# Patient Record
Sex: Male | Born: 1970 | Race: White | Hispanic: No | Marital: Married | Smoking: Current every day smoker
Health system: Southern US, Community
[De-identification: ages and names within clinical notes are randomized; demographics above are authoritative.]

## PROBLEM LIST (undated history)

## (undated) DIAGNOSIS — I2510 Atherosclerotic heart disease of native coronary artery without angina pectoris: Secondary | ICD-10-CM

## (undated) DIAGNOSIS — Z720 Tobacco use: Secondary | ICD-10-CM

## (undated) DIAGNOSIS — N529 Male erectile dysfunction, unspecified: Secondary | ICD-10-CM

## (undated) DIAGNOSIS — R519 Headache, unspecified: Secondary | ICD-10-CM

## (undated) DIAGNOSIS — I5042 Chronic combined systolic (congestive) and diastolic (congestive) heart failure: Secondary | ICD-10-CM

## (undated) DIAGNOSIS — I428 Other cardiomyopathies: Secondary | ICD-10-CM

## (undated) DIAGNOSIS — R51 Headache: Secondary | ICD-10-CM

## (undated) DIAGNOSIS — I10 Essential (primary) hypertension: Secondary | ICD-10-CM

## (undated) DIAGNOSIS — G459 Transient cerebral ischemic attack, unspecified: Secondary | ICD-10-CM

## (undated) HISTORY — DX: Other cardiomyopathies: I42.8

## (undated) HISTORY — DX: Chronic combined systolic (congestive) and diastolic (congestive) heart failure: I50.42

## (undated) HISTORY — DX: Tobacco use: Z72.0

## (undated) HISTORY — DX: Transient cerebral ischemic attack, unspecified: G45.9

## (undated) HISTORY — DX: Essential (primary) hypertension: I10

## (undated) HISTORY — DX: Male erectile dysfunction, unspecified: N52.9

---

## 2005-11-28 ENCOUNTER — Inpatient Hospital Stay (HOSPITAL_COMMUNITY): Admission: EM | Admit: 2005-11-28 | Discharge: 2005-11-29 | Payer: Self-pay | Admitting: Emergency Medicine

## 2006-07-26 ENCOUNTER — Emergency Department: Payer: Self-pay | Admitting: Emergency Medicine

## 2008-05-11 ENCOUNTER — Emergency Department: Payer: Self-pay | Admitting: Emergency Medicine

## 2008-07-03 ENCOUNTER — Other Ambulatory Visit: Payer: Self-pay

## 2008-07-03 ENCOUNTER — Observation Stay: Payer: Self-pay | Admitting: *Deleted

## 2008-07-04 ENCOUNTER — Other Ambulatory Visit: Payer: Self-pay

## 2010-10-05 ENCOUNTER — Observation Stay: Payer: Self-pay | Admitting: Internal Medicine

## 2012-06-09 ENCOUNTER — Emergency Department (HOSPITAL_COMMUNITY)
Admission: EM | Admit: 2012-06-09 | Discharge: 2012-06-09 | Disposition: A | Discharge status: Home / Self Care | Payer: Self-pay | Attending: Emergency Medicine | Admitting: Emergency Medicine

## 2012-06-09 ENCOUNTER — Encounter (HOSPITAL_COMMUNITY): Payer: Self-pay | Admitting: *Deleted

## 2012-06-09 DIAGNOSIS — E119 Type 2 diabetes mellitus without complications: Secondary | ICD-10-CM | POA: Insufficient documentation

## 2012-06-09 DIAGNOSIS — I2510 Atherosclerotic heart disease of native coronary artery without angina pectoris: Secondary | ICD-10-CM | POA: Insufficient documentation

## 2012-06-09 DIAGNOSIS — I10 Essential (primary) hypertension: Secondary | ICD-10-CM | POA: Insufficient documentation

## 2012-06-09 DIAGNOSIS — M5412 Radiculopathy, cervical region: Secondary | ICD-10-CM | POA: Insufficient documentation

## 2012-06-09 DIAGNOSIS — F17200 Nicotine dependence, unspecified, uncomplicated: Secondary | ICD-10-CM | POA: Insufficient documentation

## 2012-06-09 HISTORY — DX: Atherosclerotic heart disease of native coronary artery without angina pectoris: I25.10

## 2012-06-09 MED ORDER — HYDROCHLOROTHIAZIDE 12.5 MG PO TABS: 25.0000 mg | ORAL_TABLET | Freq: Every day | ORAL | Status: DC

## 2012-06-09 MED ORDER — KETOROLAC TROMETHAMINE 60 MG/2ML IM SOLN
60.0000 mg | Freq: Once | INTRAMUSCULAR | Status: AC
  Administered 2012-06-09: 60 mg via INTRAMUSCULAR
  Filled 2012-06-09: qty 2

## 2012-06-09 MED ORDER — KETOROLAC TROMETHAMINE 10 MG PO TABS: 10.0000 mg | ORAL_TABLET | Freq: Four times a day (QID) | ORAL | Status: AC | PRN

## 2012-06-09 MED ORDER — DEXAMETHASONE SODIUM PHOSPHATE 10 MG/ML IJ SOLN
10.0000 mg | Freq: Once | INTRAMUSCULAR | Status: AC
  Administered 2012-06-09: 10 mg via INTRAMUSCULAR
  Filled 2012-06-09: qty 1

## 2012-06-09 NOTE — ED Notes (Signed)
PT c/o shoulder pain that radiates down his arm x 1 month.  He feels he hurt his arm at work.  The pain feels like "electro-shocks".  He feels the pain is caused by the shocks in the new truck.  Pain increases with movement and coughing.

## 2012-06-09 NOTE — ED Provider Notes (Signed)
History     CSN: [id number]  Arrival date & time 06/09/12  0450   First MD Initiated Contact with Patient 06/09/12 [phone]      Chief Complaint  Patient presents with  . Neck Pain    (Consider location/radiation/quality/duration/timing/severity/associated sxs/prior treatment) HPI Comments: Pt states he has had pain in the L neck radiating into the L arm for the last month - it is worse with moving the head and neck and moving the Arm.  Sx are constant, but mild at rest.  It is an electrical shock like sensation.  No numbness or weakness and no sx in the legs.  No CP, SOB or fevers.  Denies any injury.  Patient is a 41 y.o. male presenting with neck pain. The history is provided by the patient and a relative.  Neck Pain  Pertinent negatives include no numbness and no weakness.    Past Medical History  Diagnosis Date  . Hypertension   . Diabetes mellitus   . Coronary artery disease     History reviewed. No pertinent past surgical history.  History reviewed. No pertinent family history.  History  Substance Use Topics  . Smoking status: Current Everyday Smoker -- 0.5 packs/day  . Smokeless tobacco: Not on file  . Alcohol Use: No      Review of Systems  HENT: Positive for neck pain.   Neurological: Negative for weakness and numbness.    Allergies  Review of patient's allergies indicates no known allergies.  Home Medications   Current Outpatient Rx  Name Route Sig Dispense Refill  . HYDROCHLOROTHIAZIDE 12.5 MG PO TABS Oral Take 2 tablets (25 mg total) by mouth daily. 30 tablet 1  . KETOROLAC TROMETHAMINE 10 MG PO TABS Oral Take 1 tablet (10 mg total) by mouth every 6 (six) hours as needed for pain. 20 tablet 0    BP 176/106  Pulse 88  Temp 98.1 F (36.7 C) (Oral)  Resp 20  SpO2 96%  Physical Exam  Nursing note and vitals reviewed. Constitutional: He appears well-developed and well-nourished. No distress.  HENT:  Head: Normocephalic and atraumatic.    Mouth/Throat: Oropharynx is clear and moist. No oropharyngeal exudate.  Eyes: Conjunctivae and EOM are normal. Pupils are equal, round, and reactive to light. Right eye exhibits no discharge. Left eye exhibits no discharge. No scleral icterus.  Neck: Normal range of motion. Neck supple. No JVD present. No thyromegaly present.  Cardiovascular: Normal rate, regular rhythm, normal heart sounds and intact distal pulses.  Exam reveals no gallop and no friction rub.   No murmur heard. Pulmonary/Chest: Effort normal and breath sounds normal. No respiratory distress. He has no wheezes. He has no rales.  Abdominal: Soft. Bowel sounds are normal. He exhibits no distension and no mass. There is no tenderness.  Musculoskeletal: Normal range of motion. He exhibits no edema and no tenderness.  Lymphadenopathy:    He has no cervical adenopathy.  Neurological: He is alert. Coordination normal.       Reproducible pain with axial load.  No numbness or weakness of the LUE.  Normal gait.  Skin: Skin is warm and dry. No rash noted. No erythema.  Psychiatric: He has a normal mood and affect. His behavior is normal.    ED Course  Procedures (including critical care time)  Labs Reviewed - No data to display No results found.   1. Cervical radiculopathy   2. Hypertension       MDM  Symptoms are consistent with  a cervical radiculopathy. The patient has requested Toradol as he felt this has helped as an outpatient. ER he has a prescription for hydrocodone which she will use as needed.  No other neurologic symptoms at this time and the patient has received intramuscular Toradol and intramuscular ketorolac in the emergency department.  Will refer to neurosurgery as outpatient.  The patient readily admits that he has been out of his blood pressure medication. I will refill his hydrochlorothiazide. He is aware of this problem and will have his blood pressure rechecked in one week.  Discharge Prescriptions  include:  toradol HCTZ        Vida Roller, MD 06/09/12 [phone]

## 2012-06-09 NOTE — ED Notes (Signed)
MD at bedside. 

## 2012-06-24 ENCOUNTER — Encounter (HOSPITAL_COMMUNITY): Payer: Self-pay | Admitting: Emergency Medicine

## 2012-06-24 ENCOUNTER — Emergency Department (INDEPENDENT_AMBULATORY_CARE_PROVIDER_SITE_OTHER): Payer: BC Managed Care – PPO

## 2012-06-24 ENCOUNTER — Encounter (HOSPITAL_COMMUNITY): Payer: Self-pay | Admitting: *Deleted

## 2012-06-24 ENCOUNTER — Emergency Department (HOSPITAL_COMMUNITY)
Admission: EM | Admit: 2012-06-24 | Discharge: 2012-06-24 | Discharge status: Against Medical Advice | Payer: BC Managed Care – PPO | Attending: Emergency Medicine | Admitting: Emergency Medicine

## 2012-06-24 ENCOUNTER — Emergency Department (HOSPITAL_COMMUNITY)
Admission: EM | Admit: 2012-06-24 | Discharge: 2012-06-24 | Disposition: A | Discharge status: Nursing Home (Includes ICF) | Payer: BC Managed Care – PPO | Source: Home / Self Care | Attending: Family Medicine | Admitting: Family Medicine

## 2012-06-24 DIAGNOSIS — R739 Hyperglycemia, unspecified: Secondary | ICD-10-CM

## 2012-06-24 DIAGNOSIS — I10 Essential (primary) hypertension: Secondary | ICD-10-CM

## 2012-06-24 DIAGNOSIS — R7309 Other abnormal glucose: Secondary | ICD-10-CM

## 2012-06-24 DIAGNOSIS — Z0389 Encounter for observation for other suspected diseases and conditions ruled out: Secondary | ICD-10-CM | POA: Insufficient documentation

## 2012-06-24 DIAGNOSIS — I169 Hypertensive crisis, unspecified: Secondary | ICD-10-CM

## 2012-06-24 LAB — URINE MICROSCOPIC-ADD ON

## 2012-06-24 LAB — BASIC METABOLIC PANEL
CO2: 25 mEq/L (ref 19–32)
Calcium: 9.6 mg/dL (ref 8.4–10.5)
Chloride: 97 mEq/L (ref 96–112)
Creatinine, Ser: 0.61 mg/dL (ref 0.50–1.35)
GFR calc non Af Amer: >90 mL/min (ref >90)
Glucose, Bld: 410 mg/dL — ABNORMAL HIGH (ref 70–99)
Potassium: 3.7 mEq/L (ref 3.5–5.1)
Sodium: 135 mEq/L (ref 135–145)

## 2012-06-24 LAB — URINALYSIS, ROUTINE W REFLEX MICROSCOPIC
Bilirubin Urine: NEGATIVE
Glucose, UA: >1000 mg/dL — AB
Hgb urine dipstick: NEGATIVE
Protein, ur: NEGATIVE mg/dL
pH: 5.5 (ref 5.0–8.0)

## 2012-06-24 LAB — CBC WITH DIFFERENTIAL/PLATELET
Basophils Relative: 0 % (ref 0–1)
Eosinophils Absolute: 0.2 10*3/uL (ref 0.0–0.7)
HCT: 47.2 % (ref 39.0–52.0)
Lymphocytes Relative: 26 % (ref 12–46)
Lymphs Abs: 2.2 10*3/uL (ref 0.7–4.0)
MCH: 29.9 pg (ref 26.0–34.0)
MCHC: 36.9 g/dL — ABNORMAL HIGH (ref 30.0–36.0)
MCV: 81.2 fL (ref 78.0–100.0)
Monocytes Relative: 5 % (ref 3–12)
Platelets: 171 10*3/uL (ref 150–400)
RDW: 12.8 % (ref 11.5–15.5)
WBC: 8.4 10*3/uL (ref 4.0–10.5)

## 2012-06-24 LAB — POCT I-STAT, CHEM 8
Chloride: 99 mEq/L (ref 96–112)
Creatinine, Ser: 0.6 mg/dL (ref 0.50–1.35)
HCT: 50 % (ref 39.0–52.0)
Sodium: 135 mEq/L (ref 135–145)

## 2012-06-24 MED ORDER — HYDROCODONE-ACETAMINOPHEN 5-325 MG PO TABS
ORAL_TABLET | ORAL | Status: AC
  Filled 2012-06-24: qty 2

## 2012-06-24 MED ORDER — HYDROCODONE-ACETAMINOPHEN 5-325 MG PO TABS
2.0000 | ORAL_TABLET | Freq: Once | ORAL | Status: AC
  Administered 2012-06-24: 2 via ORAL

## 2012-06-24 NOTE — ED Notes (Signed)
Patient has decided to leave without being seen. Encouraged patient to stay and informed him that he is welcomed to come back at anytime. Pt aaox4, resp e/u, nad noted at this time. Moex4. Ambulatory with no assist. Escorted by significant other.

## 2012-06-24 NOTE — ED Notes (Signed)
Pt sent here from [facility] for htn; pt was seen there for chronic neck pain and given a vicodin; pt sts took bp meds this am

## 2012-06-24 NOTE — ED Notes (Signed)
Pt  States  He  Has  A  Bulging  Disc   In his  Neck   He  States  He  Has  Been  Having     Pain  For  sebveral  Months  -  He  States  He  Was  Seen in er  sev  Weeks  Ago    -  He  States  He  Is  Trying  To see  Dr  Bettina Gavia      - he  Says he  Has  HTN  And DM      hE  SAYS  HE  DID  TAKE  HIS  BP  MED  THIS  AM

## 2012-06-24 NOTE — ED Provider Notes (Signed)
History     CSN: [id number]  Arrival date & time 06/24/12  1400   First MD Initiated Contact with Patient 06/24/12 1405      Chief Complaint  Patient presents with  . Neck Pain    (Consider location/radiation/quality/duration/timing/severity/associated sxs/prior treatment) HPI Comments: 41 year old smoker male with a history of HTN, diabetes and coronary artery disease and chronic neck pain. Here complaining of neck pain with radiation to the left arm for 2 months. He was recently seen at the emergency department with similar complaints, was found hypertensive and had a refill of HCTZ, was also diagnosed with cervical radiculopathy and had a prescription for naproxen and a referral to neuro surgery. The patient here with similar symptoms stated he's taking hydrochlorothiazide 25 mg daily last time this morning at 5 AM. Pain in the left side of the neck and left arm described as a burning type and constant with intermittent electric-like shocks down his left arm, not improving with previously prescribed medication. Denies nausea or chest pain. No shortness of breath. No headache or dizziness. No visual changes.   Past Medical History  Diagnosis Date  . Hypertension   . Diabetes mellitus   . Coronary artery disease     History reviewed. No pertinent past surgical history.  No family history on file.  History  Substance Use Topics  . Smoking status: Current Everyday Smoker -- 0.5 packs/day  . Smokeless tobacco: Not on file  . Alcohol Use: No      Review of Systems  HENT: Positive for neck pain.   Eyes: Negative for visual disturbance.  Respiratory: Negative for chest tightness and shortness of breath.   Cardiovascular: Negative for chest pain and leg swelling.  Gastrointestinal: Negative for nausea, vomiting and abdominal pain.  Musculoskeletal:       Left shoulder and arm pain as per history of present illness  Skin: Negative for rash.  Neurological: Negative for  dizziness, numbness and headaches.    Allergies  Review of patient's allergies indicates no known allergies.  Home Medications   Current Outpatient Rx  Name Route Sig Dispense Refill  . ARTHRITIS STRENGTH BC POWDER PO Oral Take 2 Packages by mouth 2 (two) times daily as needed. For pain    . HYDROCHLOROTHIAZIDE 12.5 MG PO TABS Oral Take 2 tablets (25 mg total) by mouth daily. 30 tablet 1  . METFORMIN HCL 1000 MG PO TABS Oral Take 1,000 mg by mouth daily with breakfast.      BP 202/140  Pulse 82  Temp 98.2 F (36.8 C) (Oral)  Resp 18  SpO2 96%  Physical Exam  Nursing note and vitals reviewed. Constitutional: He is oriented to person, place, and time. He appears well-developed and well-nourished. No distress.       Uncomfortable with shoulder pain.  HENT:  Head: Normocephalic and atraumatic.  Right Ear: External ear normal.  Left Ear: External ear normal.  Mouth/Throat: No oropharyngeal exudate.  Eyes: Conjunctivae and EOM are normal. Pupils are equal, round, and reactive to light. No scleral icterus.  Neck: No JVD present. No thyromegaly present.       Neck: Fair flexion and extension. Reported that electric shooting type pain radiated to left arm with left shoulder elevation and positive Spurling sign.  Cardiovascular: Normal rate, regular rhythm and normal heart sounds.   Pulmonary/Chest: Effort normal and breath sounds normal. No respiratory distress. He has no wheezes. He has no rales. He exhibits no tenderness.  Lymphadenopathy:    He  has no cervical adenopathy.  Neurological: He is alert and oriented to person, place, and time.       Left arm: Reported Hyperesthesia in inner medial proximal arm and forearm. Decrease 2 point discrimination in different points in left forearm and proximal left arm. Reported anesthesia at the base of the left thumb. Decreased strength in left grip compared with the right side.  Skin: No rash noted. He is not diaphoretic.    ED Course    Procedures (including critical care time)  Labs Reviewed  POCT I-STAT, CHEM 8 - Abnormal; Notable for the following:    Glucose, Bld 413 (*)     All other components within normal limits  LAB REPORT - SCANNED   Dg Cervical Spine Complete  06/24/2012  *RADIOLOGY REPORT*  Clinical Data: Neck pain.  Bulging disc.  CERVICAL SPINE - COMPLETE 4+ VIEW  Comparison: None.  Findings: Straightening of the normal cervical lordosis. Prevertebral soft tissues are normal.  No cervical spine fracture or dislocation.  Intervertebral disc spaces appear within normal limits.  No foraminal stenosis identified.  Odontoid appears normal.  Cervicothoracic junction normal.  IMPRESSION: No acute abnormality.  Straightening of the normal cervical lordosis may be positional or secondary to spasm.  Original Report Authenticated By: Andreas Newport, M.D.     1. Hypertensive crisis   2. Hyperglycemia       MDM  41 year old smoker male with history of diabetes hypertension and chronic neck pain. Here complaining of exacerbation of chronic neck pain with radiation to left arm associated with numbness and weakness (not new). Patient also found to be hypertensive with a blood pressure in the 200's and diastolic 110s-140. Denies chest pain, headache or blurry vision, no shortness of breath or leg swelling. Reports taking his blood pressure medication (hydrochlorothiazide 25 mg earlier today). Patient was treated with hydrocodone 10 mg and acetaminophen 650 mg with improvement of his pain. Blood pressure remained significantly elevated on recheck 206/140. Hyperglycemic with a glucose above 400. EKG with normal sinus rhythm and flattening or inverted T wave in D3 and AVF leads and early repolarization changes. Decided to transfer to the emergency department for further evaluation management.   Sharin Grave, MD 06/25/12 1137

## 2012-06-25 NOTE — ED Notes (Signed)
Pt called and states he left the ED yesterday after we transferred him down to them for hypertension.  He waited til 2000 and then left to pick up his daughter.  He states he wants something for his neck because that is why he came here, not for his b/p.  Explained to him that we will not call in medication and he would have to be seen and if his b/p is elevated then that would again be our priority.  THH

## 2012-07-06 ENCOUNTER — Other Ambulatory Visit: Payer: Self-pay | Admitting: Family Medicine

## 2012-07-06 DIAGNOSIS — M5412 Radiculopathy, cervical region: Secondary | ICD-10-CM

## 2012-07-09 ENCOUNTER — Inpatient Hospital Stay: Admission: RE | Admit: 2012-07-09 | Payer: BC Managed Care – PPO | Source: Ambulatory Visit

## 2012-07-16 ENCOUNTER — Ambulatory Visit
Admission: RE | Admit: 2012-07-16 | Discharge: 2012-07-16 | Disposition: A | Discharge status: Home / Self Care | Payer: BC Managed Care – PPO | Source: Ambulatory Visit | Attending: Family Medicine | Admitting: Family Medicine

## 2012-07-16 DIAGNOSIS — M5412 Radiculopathy, cervical region: Secondary | ICD-10-CM

## 2013-01-04 ENCOUNTER — Encounter (HOSPITAL_COMMUNITY): Payer: Self-pay

## 2013-01-04 ENCOUNTER — Emergency Department (HOSPITAL_COMMUNITY): Payer: Self-pay

## 2013-01-04 ENCOUNTER — Emergency Department (HOSPITAL_COMMUNITY)
Admission: EM | Admit: 2013-01-04 | Discharge: 2013-01-04 | Disposition: A | Discharge status: Home / Self Care | Payer: Self-pay | Attending: Emergency Medicine | Admitting: Emergency Medicine

## 2013-01-04 DIAGNOSIS — H7090 Unspecified mastoiditis, unspecified ear: Secondary | ICD-10-CM | POA: Insufficient documentation

## 2013-01-04 DIAGNOSIS — F17200 Nicotine dependence, unspecified, uncomplicated: Secondary | ICD-10-CM | POA: Insufficient documentation

## 2013-01-04 DIAGNOSIS — Z79899 Other long term (current) drug therapy: Secondary | ICD-10-CM | POA: Insufficient documentation

## 2013-01-04 DIAGNOSIS — R51 Headache: Secondary | ICD-10-CM

## 2013-01-04 DIAGNOSIS — I10 Essential (primary) hypertension: Secondary | ICD-10-CM | POA: Insufficient documentation

## 2013-01-04 DIAGNOSIS — Z91199 Patient's noncompliance with other medical treatment and regimen due to unspecified reason: Secondary | ICD-10-CM | POA: Insufficient documentation

## 2013-01-04 DIAGNOSIS — E119 Type 2 diabetes mellitus without complications: Secondary | ICD-10-CM | POA: Insufficient documentation

## 2013-01-04 DIAGNOSIS — I2510 Atherosclerotic heart disease of native coronary artery without angina pectoris: Secondary | ICD-10-CM | POA: Insufficient documentation

## 2013-01-04 MED ORDER — DIPHENHYDRAMINE HCL 50 MG/ML IJ SOLN
25.0000 mg | Freq: Once | INTRAMUSCULAR | Status: AC
  Administered 2013-01-04: 25 mg via INTRAVENOUS
  Filled 2013-01-04: qty 1

## 2013-01-04 MED ORDER — LEVOFLOXACIN 500 MG PO TABS: 500.0000 mg | ORAL_TABLET | Freq: Every day | ORAL | Status: DC

## 2013-01-04 MED ORDER — METOCLOPRAMIDE HCL 5 MG/ML IJ SOLN
10.0000 mg | Freq: Once | INTRAMUSCULAR | Status: AC
  Administered 2013-01-04: 10 mg via INTRAVENOUS
  Filled 2013-01-04: qty 2

## 2013-01-04 MED ORDER — CLONIDINE HCL 0.2 MG PO TABS
0.2000 mg | ORAL_TABLET | Freq: Once | ORAL | Status: AC
  Administered 2013-01-04: 0.2 mg via ORAL
  Filled 2013-01-04: qty 1

## 2013-01-04 MED ORDER — HYDROCHLOROTHIAZIDE 25 MG PO TABS: 25.0000 mg | ORAL_TABLET | Freq: Every day | ORAL | Status: DC

## 2013-01-04 MED ORDER — MORPHINE SULFATE 4 MG/ML IJ SOLN
4.0000 mg | Freq: Once | INTRAMUSCULAR | Status: AC
  Administered 2013-01-04: 4 mg via INTRAVENOUS
  Filled 2013-01-04: qty 1

## 2013-01-04 NOTE — ED Notes (Signed)
Pt. Reports ear problem x4 months with associated left sided HA/migraine. States it has gotten worse this past week. Denies dizziness, numbness/tingling in extremities, weakness. Alert and oriented x4.

## 2013-01-04 NOTE — ED Provider Notes (Addendum)
History     CSN: [id number]  Arrival date & time 01/04/13  0459   First MD Initiated Contact with Patient 01/04/13 [phone]      Chief Complaint  Patient presents with  . Headache    (Consider location/radiation/quality/duration/timing/severity/associated sxs/prior treatment) Patient is a 42 y.o. male presenting with headaches. The history is provided by the patient.  Headache Pain location:  L parietal Radiates to:  Does not radiate Severity currently:  8/10 Severity at highest:  8/10 Onset quality:  Gradual Duration:  8 weeks Timing:  Constant Progression:  Unchanged Chronicity:  Chronic Similar to prior headaches: yes   Relieved by:  Nothing Worsened by:  Nothing tried Ineffective treatments:  None tried   Past Medical History  Diagnosis Date  . Hypertension   . Diabetes mellitus   . Coronary artery disease     History reviewed. No pertinent past surgical history.  No family history on file.  History  Substance Use Topics  . Smoking status: Current Every Day Smoker -- 0.50 packs/day  . Smokeless tobacco: Not on file  . Alcohol Use: No      Review of Systems  Neurological: Positive for headaches.  All other systems reviewed and are negative.    Allergies  Review of patient's allergies indicates no known allergies.  Home Medications   Current Outpatient Rx  Name  Route  Sig  Dispense  Refill  . Aspirin-Salicylamide-Caffeine (ARTHRITIS STRENGTH BC POWDER PO)   Oral   Take 2 Packages by mouth 2 (two) times daily as needed. For pain         . hydrochlorothiazide (HYDRODIURIL) 12.5 MG tablet   Oral   Take 2 tablets (25 mg total) by mouth daily.   30 tablet   1   . metFORMIN (GLUCOPHAGE) 1000 MG tablet   Oral   Take 1,000 mg by mouth daily with breakfast.           BP 204/114  Pulse 94  Temp(Src) 98.5 F (36.9 C) (Oral)  Resp 19  SpO2 95%  Physical Exam  Nursing note and vitals reviewed. Constitutional: He is oriented to person,  place, and time. He appears well-developed and well-nourished.  HENT:  Head: Normocephalic and atraumatic.  Eyes: Conjunctivae are normal. Pupils are equal, round, and reactive to light.  Neck: Normal range of motion. Neck supple.  Cardiovascular: Normal rate, regular rhythm, normal heart sounds and intact distal pulses.   Pulmonary/Chest: Effort normal and breath sounds normal.  Abdominal: Soft. Bowel sounds are normal.  Neurological: He is alert and oriented to person, place, and time.  Skin: Skin is warm and dry.  Psychiatric: He has a normal mood and affect. His behavior is normal. Judgment and thought content normal.    ED Course  Procedures (including critical care time)  Labs Reviewed - No data to display No results found.   No diagnosis found.    MDM  + htn, ha,  Noncompliant with htn meds.  Will ct, analgesia, antihypertensive, reassess   Improved.  + mastoidiits.  Will abx, ent fu, refill antihypertensive,  Ret new/worsening sxs     Jireh Elmore Lytle Michaels, MD 01/04/13 0522  Rosanne Ashing, MD 01/04/13 [phone]

## 2013-02-02 ENCOUNTER — Ambulatory Visit (INDEPENDENT_AMBULATORY_CARE_PROVIDER_SITE_OTHER): Payer: Self-pay | Admitting: Otolaryngology

## 2013-02-02 DIAGNOSIS — H6980 Other specified disorders of Eustachian tube, unspecified ear: Secondary | ICD-10-CM

## 2013-02-02 DIAGNOSIS — J343 Hypertrophy of nasal turbinates: Secondary | ICD-10-CM

## 2013-02-28 ENCOUNTER — Other Ambulatory Visit: Payer: Self-pay | Admitting: Family Medicine

## 2013-08-14 ENCOUNTER — Other Ambulatory Visit: Payer: Self-pay | Admitting: Family Medicine

## 2013-12-23 ENCOUNTER — Other Ambulatory Visit: Payer: Self-pay | Admitting: Family Medicine

## 2014-02-13 ENCOUNTER — Ambulatory Visit (INDEPENDENT_AMBULATORY_CARE_PROVIDER_SITE_OTHER): Payer: Managed Care, Other (non HMO) | Admitting: Family Medicine

## 2014-02-13 ENCOUNTER — Encounter: Payer: Self-pay | Admitting: Family Medicine

## 2014-02-13 VITALS — BP 150/100 | HR 100 | Temp 98.1°F | Resp 18 | Ht 70.0 in | Wt 212.0 lb

## 2014-02-13 DIAGNOSIS — R634 Abnormal weight loss: Secondary | ICD-10-CM

## 2014-02-13 DIAGNOSIS — E1165 Type 2 diabetes mellitus with hyperglycemia: Principal | ICD-10-CM

## 2014-02-13 DIAGNOSIS — IMO0001 Reserved for inherently not codable concepts without codable children: Secondary | ICD-10-CM

## 2014-02-13 NOTE — Progress Notes (Signed)
Subjective:    Patient ID: Keith [name], male    DOB: [birth year], 43 y.o.   MRN: [id number]  HPI Patient presents today with uncontrolled weight loss over the last several months. The last time I saw the patient was in 2013. At that time he had a hemoglobin A1c of 12. The patient was lost to followup due to his lack of insurance. He is not taking any medication regularly since then. He occasionally takes metformin 1000 mg by mouth daily and he occasionally takes hydrochlorothiazide. He never checks his blood sugar. He does report polyuria and polydipsia. He denies any blurred vision. He reports satiety and nausea. 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 He denies any fevers chills easy bruising abdominal pain, chest pain, melena, hematochezia. Past Medical History  Diagnosis Date  . Hypertension   . Diabetes mellitus   . Coronary artery disease    Current Outpatient Prescriptions on File Prior to Visit  Medication Sig Dispense Refill  . hydrochlorothiazide (HYDRODIURIL) 25 MG tablet TAKE 1 TABLET BY MOUTH EVERY DAY  30 tablet  5  . metFORMIN (GLUCOPHAGE) 1000 MG tablet Take 1,000 mg by mouth 2 (two) times daily with a meal.       . glipiZIDE (GLUCOTROL) 5 MG tablet Take 5 mg by mouth 2 (two) times daily before a meal.       No current facility-administered medications on file prior to visit.   No Known Allergies History   Social History  . Marital Status: Married    Spouse Name: N/A    Number of Children: N/A  . Years of Education: N/A   Occupational History  . Not on file.   Social History Main Topics  . Smoking status: Current Every Day Smoker -- 0.50 packs/day  . Smokeless tobacco: Not on file  . Alcohol Use: No  . Drug Use: No  . Sexual Activity:    Other Topics Concern  . Not on file   Social History Narrative  . No narrative on file      Review of Systems  All other systems reviewed and are negative.       Objective:   Physical Exam  Vitals  reviewed. Constitutional: He appears well-developed and well-nourished.  Neck: Neck supple. No thyromegaly present.  Cardiovascular: Normal rate, regular rhythm, normal heart sounds and intact distal pulses.  Exam reveals no gallop and no friction rub.   No murmur heard. Pulmonary/Chest: Effort normal and breath sounds normal. No respiratory distress. He has no wheezes. He has no rales. He exhibits no tenderness.  Abdominal: Soft. Bowel sounds are normal. He exhibits no distension and no mass. There is no tenderness. There is no rebound and no guarding.  Lymphadenopathy:    He has no cervical adenopathy.          Assessment & Plan:  1. Type II or unspecified type diabetes mellitus without mention of complication, uncontrolled The patient's loss of  weight is likely due to uncontrolled diabetes. The patient does not want to check blood work due to the class and the fact he has no insurance. The started a CMP and hemoglobin A1c. I await these test results. Based upon these test results we will implement medications to try to bring his sugars under control. He is asked 18 pounds since his last office visit he was 230 pounds 2 years ago.  Patient's blood pressure is out of control. I will await the results of the test results prior  to starting medication. I will check the patient's renal function and determine the medications on going to be using her diabetes prior to starting him on blood pressure medication. - COMPLETE METABOLIC PANEL WITH GFR - Hemoglobin A1c  2. Loss of weight

## 2014-02-14 ENCOUNTER — Other Ambulatory Visit: Payer: Self-pay

## 2014-02-14 LAB — COMPLETE METABOLIC PANEL WITH GFR
ALT: 17 U/L (ref 0–53)
AST: 17 U/L (ref 0–37)
Albumin: 4.2 g/dL (ref 3.5–5.2)
Alkaline Phosphatase: 71 U/L (ref 39–117)
BUN: 12 mg/dL (ref 6–23)
CALCIUM: 9.2 mg/dL (ref 8.4–10.5)
CHLORIDE: 99 meq/L (ref 96–112)
CO2: 29 mEq/L (ref 19–32)
CREATININE: 0.68 mg/dL (ref 0.50–1.35)
GFR, Est African American: >89 mL/min
GFR, Est Non African American: >89 mL/min
Glucose, Bld: 178 mg/dL — ABNORMAL HIGH (ref 70–99)
Potassium: 3.4 mEq/L — ABNORMAL LOW (ref 3.5–5.3)
Sodium: 137 mEq/L (ref 135–145)
Total Bilirubin: 1 mg/dL (ref 0.2–1.2)
Total Protein: 6.6 g/dL (ref 6.0–8.3)

## 2014-02-14 LAB — HEMOGLOBIN A1C
HEMOGLOBIN A1C: 9.6 % — AB (ref <5.7)
MEAN PLASMA GLUCOSE: 229 mg/dL — AB (ref <117)

## 2014-02-14 MED ORDER — GLIPIZIDE 10 MG PO TABS
10.0000 mg | ORAL_TABLET | Freq: Two times a day (BID) | ORAL | Status: DC
Start: 2014-02-14 — End: 2014-09-18

## 2014-02-14 MED ORDER — PIOGLITAZONE HCL 15 MG PO TABS: 15.0000 mg | ORAL_TABLET | Freq: Every day | ORAL | Status: DC

## 2014-05-20 ENCOUNTER — Other Ambulatory Visit: Payer: Self-pay | Admitting: Family Medicine

## 2014-09-17 ENCOUNTER — Ambulatory Visit (INDEPENDENT_AMBULATORY_CARE_PROVIDER_SITE_OTHER): Payer: Managed Care, Other (non HMO) | Admitting: Family Medicine

## 2014-09-17 ENCOUNTER — Ambulatory Visit
Admission: RE | Admit: 2014-09-17 | Discharge: 2014-09-17 | Disposition: A | Discharge status: Home / Self Care | Payer: Commercial Indemnity | Source: Ambulatory Visit | Attending: Family Medicine | Admitting: Family Medicine

## 2014-09-17 ENCOUNTER — Encounter: Payer: Self-pay | Admitting: Family Medicine

## 2014-09-17 VITALS — BP 130/78 | HR 78 | Temp 98.3°F | Resp 18 | Wt 194.0 lb

## 2014-09-17 DIAGNOSIS — E1165 Type 2 diabetes mellitus with hyperglycemia: Secondary | ICD-10-CM

## 2014-09-17 DIAGNOSIS — R634 Abnormal weight loss: Secondary | ICD-10-CM

## 2014-09-17 DIAGNOSIS — IMO0002 Reserved for concepts with insufficient information to code with codable children: Secondary | ICD-10-CM

## 2014-09-17 LAB — CBC WITH DIFFERENTIAL/PLATELET
BASOS ABS: 0 10*3/uL (ref 0.0–0.1)
Basophils Relative: 0 % (ref 0–1)
Eosinophils Absolute: 0.1 10*3/uL (ref 0.0–0.7)
Eosinophils Relative: 2 % (ref 0–5)
HEMATOCRIT: 49.5 % (ref 39.0–52.0)
Hemoglobin: 17.6 g/dL — ABNORMAL HIGH (ref 13.0–17.0)
LYMPHS PCT: 22 % (ref 12–46)
Lymphs Abs: 1.4 10*3/uL (ref 0.7–4.0)
MCH: 29.2 pg (ref 26.0–34.0)
MCHC: 35.6 g/dL (ref 30.0–36.0)
MCV: 82.1 fL (ref 78.0–100.0)
MONO ABS: 0.4 10*3/uL (ref 0.1–1.0)
Monocytes Relative: 6 % (ref 3–12)
NEUTROS ABS: 4.4 10*3/uL (ref 1.7–7.7)
NEUTROS PCT: 70 % (ref 43–77)
PLATELETS: 162 10*3/uL (ref 150–400)
RBC: 6.03 MIL/uL — ABNORMAL HIGH (ref 4.22–5.81)
RDW: 14.2 % (ref 11.5–15.5)
WBC: 6.3 10*3/uL (ref 4.0–10.5)

## 2014-09-17 LAB — COMPLETE METABOLIC PANEL WITH GFR
ALT: 8 U/L (ref 0–53)
AST: 11 U/L (ref 0–37)
Albumin: 4.2 g/dL (ref 3.5–5.2)
Alkaline Phosphatase: 66 U/L (ref 39–117)
BILIRUBIN TOTAL: 1 mg/dL (ref 0.2–1.2)
BUN: 10 mg/dL (ref 6–23)
CO2: 29 mEq/L (ref 19–32)
CREATININE: 0.73 mg/dL (ref 0.50–1.35)
Calcium: 8.8 mg/dL (ref 8.4–10.5)
Chloride: 98 mEq/L (ref 96–112)
GFR, Est Non African American: >89 mL/min
Glucose, Bld: 144 mg/dL — ABNORMAL HIGH (ref 70–99)
Potassium: 3.4 mEq/L — ABNORMAL LOW (ref 3.5–5.3)
Sodium: 138 mEq/L (ref 135–145)
Total Protein: 6.2 g/dL (ref 6.0–8.3)

## 2014-09-17 LAB — TSH: TSH: 4.965 u[IU]/mL — AB (ref 0.350–4.500)

## 2014-09-17 NOTE — Progress Notes (Signed)
Subjective:    Patient ID: Keith [name], male    DOB: January 25, 1971, 43 y.o.   MRN: [id number]  HPI Patient stopped all his medication after I saw him in March due to erectile dysfunction. The only medication that he is currently taking his hydrochlorothiazide 25 mg by mouth daily. When I saw the patient in March his hemoglobin A1c was almost 10. He had been losing substantial weight prior to that. He continues to lose substantial weight and has lost an additional 14 pounds over the last 8 months. He reports poor appetite. He reports polyuria. He reports blurred vision. He reports daily dizziness. He reports strong smelling concentrated urine. He denies any fevers or chills or easy bruising. He denies any melena or hematochezia. He denies any cough or chest pain. He does have a history of smoking. He denies any risk of HIV exposure. Past Medical History  Diagnosis Date  . Hypertension   . Diabetes mellitus   . Coronary artery disease    No past surgical history on file. Current Outpatient Prescriptions on File Prior to Visit  Medication Sig Dispense Refill  . hydrochlorothiazide (HYDRODIURIL) 25 MG tablet TAKE 1 TABLET BY MOUTH EVERY DAY 30 tablet 5  . hydrochlorothiazide (HYDRODIURIL) 25 MG tablet TAKE 1 TABLET BY MOUTH EVERY DAY 30 tablet 11  . metFORMIN (GLUCOPHAGE) 1000 MG tablet Take 1,000 mg by mouth 2 (two) times daily with a meal.     . glipiZIDE (GLUCOTROL) 10 MG tablet Take 1 tablet (10 mg total) by mouth 2 (two) times daily before a meal. 60 tablet 3  . pioglitazone (ACTOS) 15 MG tablet Take 1 tablet (15 mg total) by mouth daily. 30 tablet 3   No current facility-administered medications on file prior to visit.   No Known Allergies History   Social History  . Marital Status: Married    Spouse Name: N/A    Number of Children: N/A  . Years of Education: N/A   Occupational History  . Not on file.   Social History Main Topics  . Smoking status: Current Every Day Smoker  -- 0.50 packs/day  . Smokeless tobacco: Not on file  . Alcohol Use: No  . Drug Use: No  . Sexual Activity:    Other Topics Concern  . Not on file   Social History Narrative      Review of Systems  All other systems reviewed and are negative.      Objective:   Physical Exam  Constitutional: He appears well-developed and well-nourished. No distress.  HENT:  Right Ear: External ear normal.  Left Ear: External ear normal.  Nose: Nose normal.  Mouth/Throat: Oropharynx is clear and moist.  Eyes: Conjunctivae are normal. No scleral icterus.  Neck: Neck supple. No thyromegaly present.  Cardiovascular: Normal rate, regular rhythm and normal heart sounds.   Pulmonary/Chest: Effort normal and breath sounds normal. No respiratory distress. He has no wheezes. He has no rales.  Abdominal: Soft. Bowel sounds are normal. He exhibits no distension. There is no tenderness. There is no rebound and no guarding.  Lymphadenopathy:    He has no cervical adenopathy.  Skin: No rash noted. He is not diaphoretic.  Vitals reviewed.         Assessment & Plan:  Loss of weight - Plan: COMPLETE METABOLIC PANEL WITH GFR, Hemoglobin A1c, CBC with Differential, TSH, Sedimentation rate, DG Chest 2 View  Diabetes mellitus type II, uncontrolled - Plan: COMPLETE METABOLIC PANEL WITH GFR, Hemoglobin A1c,  CBC with Differential, TSH, Sedimentation rate, DG Chest 2 View  I suspect the patient's weight loss is due to uncontrolled diabetes mellitus. Check hemoglobin A1c is up a at all. I'll see him back tomorrow to discuss his options for control of his diabetes. To be also check a TSH and sedimentation rate and CBC. I'll also send the patient to get a chest x-ray given his smoking history. Recheck in 24 hours.

## 2014-09-18 ENCOUNTER — Ambulatory Visit (INDEPENDENT_AMBULATORY_CARE_PROVIDER_SITE_OTHER): Payer: Managed Care, Other (non HMO) | Admitting: Family Medicine

## 2014-09-18 ENCOUNTER — Encounter: Payer: Self-pay | Admitting: Family Medicine

## 2014-09-18 VITALS — BP 142/100 | HR 98 | Temp 98.6°F | Resp 16 | Wt 195.0 lb

## 2014-09-18 DIAGNOSIS — R634 Abnormal weight loss: Secondary | ICD-10-CM

## 2014-09-18 LAB — HEMOGLOBIN A1C
HEMOGLOBIN A1C: 6.8 % — AB (ref <5.7)
MEAN PLASMA GLUCOSE: 148 mg/dL — AB (ref <117)

## 2014-09-18 LAB — SEDIMENTATION RATE: SED RATE: 1 mm/h (ref 0–16)

## 2014-09-18 MED ORDER — METFORMIN HCL 1000 MG PO TABS: 1000.0000 mg | ORAL_TABLET | Freq: Two times a day (BID) | ORAL | Status: DC

## 2014-09-18 MED ORDER — VENLAFAXINE HCL ER 75 MG PO CP24: 75.0000 mg | ORAL_CAPSULE | Freq: Every day | ORAL | Status: DC

## 2014-09-18 MED ORDER — HYDROCHLOROTHIAZIDE 25 MG PO TABS: ORAL_TABLET | ORAL | Status: DC

## 2014-09-18 NOTE — Progress Notes (Signed)
Subjective:    Patient ID: Keith [name], male    DOB: September 22, 1971, 43 y.o.   MRN: [id number]  HPI 09/17/14 Patient stopped all his medication after I saw him in March due to erectile dysfunction. The only medication that he is currently taking his hydrochlorothiazide 25 mg by mouth daily. When I saw the patient in March his hemoglobin A1c was almost 10. He had been losing substantial weight prior to that. He continues to lose substantial weight and has lost an additional 14 pounds over the last 8 months. He reports poor appetite. He reports polyuria. He reports blurred vision. He reports daily dizziness. He reports strong smelling concentrated urine. He denies any fevers or chills or easy bruising. He denies any melena or hematochezia. He denies any cough or chest pain. He does have a history of smoking. He denies any risk of HIV exposure.  At that time, my plan was:  I suspect the patient's weight loss is due to uncontrolled diabetes mellitus. Check hemoglobin A1c to determine how poorly controlled his diabetes is. I'll see him back tomorrow to discuss his options for control of his diabetes. To be thorough, I will also check a TSH and sedimentation rate and CBC. I'll also send the patient to get a chest x-ray given his smoking history. Recheck in 24 hours.  09/18/14 Patient's lab work as listed below: Office Visit on 09/17/2014  Component Date Value Ref Range Status  . Sodium 09/17/2014 138  135 - 145 mEq/L Final  . Potassium 09/17/2014 3.4* 3.5 - 5.3 mEq/L Final  . Chloride 09/17/2014 98  96 - 112 mEq/L Final  . CO2 09/17/2014 29  19 - 32 mEq/L Final  . Glucose, Bld 09/17/2014 144* 70 - 99 mg/dL Final  . BUN 09/17/2014 10  6 - 23 mg/dL Final  . Creat 09/17/2014 0.73  0.50 - 1.35 mg/dL Final  . Total Bilirubin 09/17/2014 1.0  0.2 - 1.2 mg/dL Final  . Alkaline Phosphatase 09/17/2014 66  39 - 117 U/L Final  . AST 09/17/2014 11  0 - 37 U/L Final  . ALT 09/17/2014 8  0 - 53 U/L Final  .  Total Protein 09/17/2014 6.2  6.0 - 8.3 g/dL Final  . Albumin 09/17/2014 4.2  3.5 - 5.2 g/dL Final  . Calcium 09/17/2014 8.8  8.4 - 10.5 mg/dL Final  . GFR, Est African American 09/17/2014 >89   Final  . GFR, Est Non African American 09/17/2014 >89   Final   Comment:   The estimated GFR is a calculation valid for adults (>=38 years old) that uses the CKD-EPI algorithm to adjust for age and sex. It is   not to be used for children, pregnant women, hospitalized patients,    patients on dialysis, or with rapidly changing kidney function. According to the NKDEP, eGFR >89 is normal, 60-89 shows mild impairment, 30-59 shows moderate impairment, 15-29 shows severe impairment and <15 is ESRD.     [name] Kitchen Hgb A1c MFr Bld 09/17/2014 6.8* <5.7 % Final   Comment:                                                                        According to the [place]  Recommendations for 2011, when HbA1c is used as a screening test:     >=6.5%   Diagnostic of Diabetes Mellitus            (if abnormal result is confirmed)   5.7-6.4%   Increased risk of developing Diabetes Mellitus   References:Diagnosis and Classification of Diabetes Mellitus,Diabetes QQPY,1950,93(OIZTI 1):S62-S69 and Standards of Medical Care in         Diabetes - 2011,Diabetes WPYK,9983,38 (Suppl 1):S11-S61.     . Mean Plasma Glucose 09/17/2014 148* <117 mg/dL Final  . WBC 09/17/2014 6.3  4.0 - 10.5 K/uL Final  . RBC 09/17/2014 6.03* 4.22 - 5.81 MIL/uL Final  . Hemoglobin 09/17/2014 17.6* 13.0 - 17.0 g/dL Final  . HCT 09/17/2014 49.5  39.0 - 52.0 % Final  . MCV 09/17/2014 82.1  78.0 - 100.0 fL Final  . MCH 09/17/2014 29.2  26.0 - 34.0 pg Final  . MCHC 09/17/2014 35.6  30.0 - 36.0 g/dL Final  . RDW 09/17/2014 14.2  11.5 - 15.5 % Final  . Platelets 09/17/2014 162  150 - 400 K/uL Final  . Neutrophils Relative % 09/17/2014 70  43 - 77 % Final  . Neutro Abs 09/17/2014 4.4  1.7 - 7.7 K/uL Final  . Lymphocytes Relative  09/17/2014 22  12 - 46 % Final  . Lymphs Abs 09/17/2014 1.4  0.7 - 4.0 K/uL Final  . Monocytes Relative 09/17/2014 6  3 - 12 % Final  . Monocytes Absolute 09/17/2014 0.4  0.1 - 1.0 K/uL Final  . Eosinophils Relative 09/17/2014 2  0 - 5 % Final  . Eosinophils Absolute 09/17/2014 0.1  0.0 - 0.7 K/uL Final  . Basophils Relative 09/17/2014 0  0 - 1 % Final  . Basophils Absolute 09/17/2014 0.0  0.0 - 0.1 K/uL Final  . Smear Review 09/17/2014 Criteria for review not met   Final  . TSH 09/17/2014 4.965* 0.350 - 4.500 uIU/mL Final  . Sed Rate 09/17/2014 1  0 - 16 mm/hr Final   His chest x-ray shows no acute cardiopulmonary process.  He is here today to discuss further.  We discussed all his lab work. The patient reports he is under tremendous stress. He is also having a difficult time sleeping. He reports anhedonia. He reports no desire to eat. He does not want to take medication which can cause erectile dysfunction. That is why he discontinued the glipizide and Actos. Past Medical History  Diagnosis Date  . Hypertension   . Diabetes mellitus   . Coronary artery disease    No past surgical history on file. Current outpatient prescriptions: hydrochlorothiazide (HYDRODIURIL) 25 MG tablet, TAKE 1 TABLET BY MOUTH EVERY DAY, Disp: 30 tablet, Rfl: 11;  metFORMIN (GLUCOPHAGE) 1000 MG tablet, Take 1 tablet (1,000 mg total) by mouth 2 (two) times daily with a meal., Disp: 60 tablet, Rfl: 5;  venlafaxine XR (EFFEXOR XR) 75 MG 24 hr capsule, Take 1 capsule (75 mg total) by mouth daily with breakfast., Disp: 30 capsule, Rfl: 1  No Known Allergies History   Social History  . Marital Status: Married    Spouse Name: N/A    Number of Children: N/A  . Years of Education: N/A   Occupational History  . Not on file.   Social History Main Topics  . Smoking status: Current Every Day Smoker -- 0.50 packs/day  . Smokeless tobacco: Not on file  . Alcohol Use: No  . Drug Use: No  . Sexual Activity: Not on  file     Other Topics Concern  . Not on file   Social History Narrative      Review of Systems  All other systems reviewed and are negative.      Objective:   Physical Exam  Constitutional: He appears well-developed and well-nourished. No distress.  HENT:  Right Ear: External ear normal.  Left Ear: External ear normal.  Nose: Nose normal.  Mouth/Throat: Oropharynx is clear and moist.  Eyes: Conjunctivae are normal. No scleral icterus.  Neck: Neck supple. No thyromegaly present.  Cardiovascular: Normal rate, regular rhythm and normal heart sounds.   Pulmonary/Chest: Effort normal and breath sounds normal. No respiratory distress. He has no wheezes. He has no rales.  Abdominal: Soft. Bowel sounds are normal. He exhibits no distension. There is no tenderness. There is no rebound and no guarding.  Lymphadenopathy:    He has no cervical adenopathy.  Skin: No rash noted. He is not diaphoretic.  Vitals reviewed.         Assessment & Plan:  Loss of weight - Plan: venlafaxine XR (EFFEXOR XR) 75 MG 24 hr capsule  Patient's hemoglobin A1c is acceptable. The remainder of his lab work is within normal limits. I do not believe his borderline elevated TSH is causing weight loss. His chest x-ray was normal. Other than his stress, the patient is asymptomatic. Therefore I believe it could be stress and depression which is causing his loss of weight due to anorexia. Therefore I'll start the patient on Effexor XR 75 mg by mouth daily and recheck the patient in 1 month. Patient attributes his elevated blood pressure to anxiety. I will recheck that in 1 month as well

## 2014-10-18 ENCOUNTER — Ambulatory Visit: Payer: Managed Care, Other (non HMO) | Admitting: Family Medicine

## 2014-10-22 ENCOUNTER — Ambulatory Visit: Payer: Managed Care, Other (non HMO) | Admitting: Family Medicine

## 2015-01-23 ENCOUNTER — Emergency Department (HOSPITAL_COMMUNITY): Payer: Commercial Indemnity

## 2015-01-23 ENCOUNTER — Encounter (HOSPITAL_COMMUNITY): Payer: Self-pay | Admitting: *Deleted

## 2015-01-23 ENCOUNTER — Observation Stay (HOSPITAL_COMMUNITY): Payer: Commercial Indemnity

## 2015-01-23 ENCOUNTER — Observation Stay (HOSPITAL_COMMUNITY)
Admission: EM | Admit: 2015-01-23 | Discharge: 2015-01-24 | Disposition: A | Discharge status: Home / Self Care | Payer: Commercial Indemnity | Attending: Internal Medicine | Admitting: Internal Medicine

## 2015-01-23 DIAGNOSIS — R4781 Slurred speech: Secondary | ICD-10-CM | POA: Diagnosis not present

## 2015-01-23 DIAGNOSIS — E119 Type 2 diabetes mellitus without complications: Secondary | ICD-10-CM | POA: Insufficient documentation

## 2015-01-23 DIAGNOSIS — I2510 Atherosclerotic heart disease of native coronary artery without angina pectoris: Secondary | ICD-10-CM | POA: Insufficient documentation

## 2015-01-23 DIAGNOSIS — I10 Essential (primary) hypertension: Secondary | ICD-10-CM | POA: Diagnosis not present

## 2015-01-23 DIAGNOSIS — I639 Cerebral infarction, unspecified: Secondary | ICD-10-CM

## 2015-01-23 DIAGNOSIS — E1159 Type 2 diabetes mellitus with other circulatory complications: Secondary | ICD-10-CM | POA: Diagnosis present

## 2015-01-23 DIAGNOSIS — R51 Headache: Secondary | ICD-10-CM

## 2015-01-23 DIAGNOSIS — E118 Type 2 diabetes mellitus with unspecified complications: Secondary | ICD-10-CM

## 2015-01-23 DIAGNOSIS — R479 Unspecified speech disturbances: Secondary | ICD-10-CM

## 2015-01-23 DIAGNOSIS — G459 Transient cerebral ischemic attack, unspecified: Secondary | ICD-10-CM | POA: Diagnosis present

## 2015-01-23 DIAGNOSIS — R202 Paresthesia of skin: Secondary | ICD-10-CM | POA: Diagnosis not present

## 2015-01-23 DIAGNOSIS — G43501 Persistent migraine aura without cerebral infarction, not intractable, with status migrainosus: Secondary | ICD-10-CM | POA: Insufficient documentation

## 2015-01-23 DIAGNOSIS — R519 Headache, unspecified: Secondary | ICD-10-CM

## 2015-01-23 DIAGNOSIS — I152 Hypertension secondary to endocrine disorders: Secondary | ICD-10-CM | POA: Diagnosis present

## 2015-01-23 DIAGNOSIS — I160 Hypertensive urgency: Secondary | ICD-10-CM

## 2015-01-23 DIAGNOSIS — E1140 Type 2 diabetes mellitus with diabetic neuropathy, unspecified: Secondary | ICD-10-CM

## 2015-01-23 HISTORY — DX: Headache: R51

## 2015-01-23 HISTORY — DX: Headache, unspecified: R51.9

## 2015-01-23 LAB — I-STAT CHEM 8, ED
BUN: 17 mg/dL (ref 6–23)
Calcium, Ion: 1.07 mmol/L — ABNORMAL LOW (ref 1.12–1.23)
Chloride: 101 mmol/L (ref 96–112)
Creatinine, Ser: 0.7 mg/dL (ref 0.50–1.35)
Glucose, Bld: 160 mg/dL — ABNORMAL HIGH (ref 70–99)
HEMATOCRIT: 49 % (ref 39.0–52.0)
HEMOGLOBIN: 16.7 g/dL (ref 13.0–17.0)
Potassium: 3.5 mmol/L (ref 3.5–5.1)
Sodium: 139 mmol/L (ref 135–145)
TCO2: 23 mmol/L (ref 0–100)

## 2015-01-23 LAB — DIFFERENTIAL
Basophils Absolute: 0 10*3/uL (ref 0.0–0.1)
Basophils Relative: 0 % (ref 0–1)
EOS ABS: 0.1 10*3/uL (ref 0.0–0.7)
Eosinophils Relative: 1 % (ref 0–5)
LYMPHS PCT: 23 % (ref 12–46)
Lymphs Abs: 1.6 10*3/uL (ref 0.7–4.0)
MONOS PCT: 4 % (ref 3–12)
Monocytes Absolute: 0.3 10*3/uL (ref 0.1–1.0)
NEUTROS ABS: 4.9 10*3/uL (ref 1.7–7.7)
Neutrophils Relative %: 72 % (ref 43–77)

## 2015-01-23 LAB — COMPREHENSIVE METABOLIC PANEL
ALBUMIN: 3.8 g/dL (ref 3.5–5.2)
ALK PHOS: 67 U/L (ref 39–117)
ALT: 14 U/L (ref 0–53)
AST: 19 U/L (ref 0–37)
Anion gap: 7 (ref 5–15)
BUN: 15 mg/dL (ref 6–23)
CO2: 27 mmol/L (ref 19–32)
Calcium: 9 mg/dL (ref 8.4–10.5)
Chloride: 104 mmol/L (ref 96–112)
Creatinine, Ser: 0.75 mg/dL (ref 0.50–1.35)
GFR calc Af Amer: >90 mL/min (ref >90)
GFR calc non Af Amer: >90 mL/min (ref >90)
Glucose, Bld: 160 mg/dL — ABNORMAL HIGH (ref 70–99)
Potassium: 3.5 mmol/L (ref 3.5–5.1)
Sodium: 138 mmol/L (ref 135–145)
Total Bilirubin: 1.2 mg/dL (ref 0.3–1.2)
Total Protein: 6.8 g/dL (ref 6.0–8.3)

## 2015-01-23 LAB — CBC
HCT: 49.3 % (ref 39.0–52.0)
Hemoglobin: 17.7 g/dL — ABNORMAL HIGH (ref 13.0–17.0)
MCH: 29.4 pg (ref 26.0–34.0)
MCHC: 35.9 g/dL (ref 30.0–36.0)
MCV: 81.8 fL (ref 78.0–100.0)
PLATELETS: 172 10*3/uL (ref 150–400)
RBC: 6.03 MIL/uL — ABNORMAL HIGH (ref 4.22–5.81)
RDW: 12.5 % (ref 11.5–15.5)
WBC: 7 10*3/uL (ref 4.0–10.5)

## 2015-01-23 LAB — CBG MONITORING, ED: GLUCOSE-CAPILLARY: 157 mg/dL — AB (ref 70–99)

## 2015-01-23 LAB — GLUCOSE, CAPILLARY: Glucose-Capillary: 132 mg/dL — ABNORMAL HIGH (ref 70–99)

## 2015-01-23 LAB — I-STAT TROPONIN, ED: TROPONIN I, POC: 0 ng/mL (ref 0.00–0.08)

## 2015-01-23 LAB — PROTIME-INR
INR: 0.99 (ref 0.00–1.49)
PROTHROMBIN TIME: 13.2 s (ref 11.6–15.2)

## 2015-01-23 LAB — ETHANOL: Alcohol, Ethyl (B): <5 mg/dL (ref 0–9)

## 2015-01-23 LAB — APTT: aPTT: 34 seconds (ref 24–37)

## 2015-01-23 LAB — TROPONIN I: TROPONIN I: 0.07 ng/mL — AB (ref <0.031)

## 2015-01-23 MED ORDER — SODIUM CHLORIDE 0.9 % IV BOLUS (SEPSIS)
500.0000 mL | Freq: Once | INTRAVENOUS | Status: AC
  Administered 2015-01-23: 500 mL via INTRAVENOUS

## 2015-01-23 MED ORDER — SODIUM CHLORIDE 0.9 % IV SOLN
INTRAVENOUS | Status: AC
  Administered 2015-01-23: 500 mL via INTRAVENOUS

## 2015-01-23 MED ORDER — LABETALOL HCL 5 MG/ML IV SOLN
10.0000 mg | INTRAVENOUS | Status: DC | PRN
  Administered 2015-01-23: 10 mg via INTRAVENOUS
  Filled 2015-01-23: qty 4

## 2015-01-23 MED ORDER — LORAZEPAM 2 MG/ML IJ SOLN
1.0000 mg | Freq: Once | INTRAMUSCULAR | Status: AC
  Administered 2015-01-23: 1 mg via INTRAVENOUS

## 2015-01-23 MED ORDER — SODIUM CHLORIDE 0.9 % IV SOLN
100.0000 mL/h | INTRAVENOUS | Status: DC
  Administered 2015-01-23: 100 mL/h via INTRAVENOUS

## 2015-01-23 MED ORDER — STROKE: EARLY STAGES OF RECOVERY BOOK
Freq: Once | Status: AC
  Administered 2015-01-23: 1
  Filled 2015-01-23: qty 1

## 2015-01-23 MED ORDER — INSULIN ASPART 100 UNIT/ML ~~LOC~~ SOLN: 0.0000 [IU] | Freq: Every day | SUBCUTANEOUS | Status: DC

## 2015-01-23 MED ORDER — LORAZEPAM 2 MG/ML IJ SOLN
INTRAMUSCULAR | Status: AC
  Filled 2015-01-23: qty 1

## 2015-01-23 MED ORDER — INSULIN ASPART 100 UNIT/ML ~~LOC~~ SOLN
0.0000 [IU] | Freq: Three times a day (TID) | SUBCUTANEOUS | Status: DC
  Administered 2015-01-24: 3 [IU] via SUBCUTANEOUS

## 2015-01-23 MED ORDER — HYDROCHLOROTHIAZIDE 25 MG PO TABS
25.0000 mg | ORAL_TABLET | Freq: Every day | ORAL | Status: DC
  Administered 2015-01-23 – 2015-01-24 (×2): 25 mg via ORAL
  Filled 2015-01-23 (×2): qty 1

## 2015-01-23 MED ORDER — ENOXAPARIN SODIUM 40 MG/0.4ML ~~LOC~~ SOLN
40.0000 mg | SUBCUTANEOUS | Status: DC
  Administered 2015-01-23: 40 mg via SUBCUTANEOUS
  Filled 2015-01-23: qty 0.4

## 2015-01-23 NOTE — Consult Note (Signed)
Referring Physician: Jeraldine Loots    Chief Complaint: HA associated with dysarthria and left arm tingling  HPI:                                                                                                                                         Keith [name] is an 44 y.o. male who woke up this AM with a sever HA.  He went to work and sensed something was not right --he thought his BP was elevated. While he was at work he noted he started to have tingling in his left arm and his tongue felt weird.  EMS was called to scene due to patient having HA and diaphoretic along with coworkers noting his speech was slurred. On arrival it was noted his BP was 215/120.  On arrival to ED his BP was 198/112 and hid symptoms had resolved. CT head was negative for bleed or acute stroke.  In further discussion he noted he has migraines/HA every other day and takes about 50 BC's in a week. He has had neurological symptoms with both migraine HA and when his BP is elevated.  Denies vertigo, double vision, focal weakness, or visual disturbances.  Due to symptoms resolved tPA was not administered.   Date last known well: Date: 01/23/2015 Time last known well: Time: 13:30 tPA Given: No: symptoms resolved Modified Rankin: Rankin Score=0    Past Medical History  Diagnosis Date  . Hypertension   . Diabetes mellitus   . Coronary artery disease   . Frequent headaches     History reviewed. No pertinent past surgical history.  Family History  Problem Relation Age of Onset  . Hypertension Mother   . Hypertension Father    Social History:  reports that he has been smoking.  He does not have any smokeless tobacco history on file. He reports that he does not drink alcohol or use illicit drugs.  Allergies: No Known Allergies  Medications:                                                                                                                           Current Facility-Administered Medications  Medication  Dose Route Frequency Provider Last Rate Last Dose  . sodium chloride 0.9 % bolus 500 mL  500 mL Intravenous Once Gerhard Munch, MD       Followed by  .  0.9 %  sodium chloride infusion  100 mL/hr Intravenous Continuous Gerhard Munch, MD       Current Outpatient Prescriptions  Medication Sig Dispense Refill  . hydrochlorothiazide (HYDRODIURIL) 25 MG tablet TAKE 1 TABLET BY MOUTH EVERY DAY 30 tablet 11  . metFORMIN (GLUCOPHAGE) 1000 MG tablet Take 1 tablet (1,000 mg total) by mouth 2 (two) times daily with a meal. 60 tablet 5  . venlafaxine XR (EFFEXOR XR) 75 MG 24 hr capsule Take 1 capsule (75 mg total) by mouth daily with breakfast. 30 capsule 1     ROS:                                                                                                                                       History obtained from the patient  General ROS: negative for - chills, fatigue, fever, night sweats, weight gain or weight loss Psychological ROS: negative for - behavioral disorder, hallucinations, memory difficulties, mood swings or suicidal ideation Ophthalmic ROS: negative for - blurry vision, double vision, eye pain or loss of vision ENT ROS: negative for - epistaxis, nasal discharge, oral lesions, sore throat, tinnitus or vertigo Allergy and Immunology ROS: negative for - hives or itchy/watery eyes Hematological and Lymphatic ROS: negative for - bleeding problems, bruising or swollen lymph nodes Endocrine ROS: negative for - galactorrhea, hair pattern changes, polydipsia/polyuria or temperature intolerance Respiratory ROS: negative for - cough, hemoptysis, shortness of breath or wheezing Cardiovascular ROS: negative for - chest pain, dyspnea on exertion, edema or irregular heartbeat Gastrointestinal ROS: negative for - abdominal pain, diarrhea, hematemesis, nausea/vomiting or stool incontinence Genito-Urinary ROS: negative for - dysuria, hematuria, incontinence or urinary  frequency/urgency Musculoskeletal ROS: negative for - joint swelling or muscular weakness Neurological ROS: as noted in HPI Dermatological ROS: negative for rash and skin lesion changes  Neurologic Examination:                                                                                                      Blood pressure 195/112, pulse 86, temperature 98.4 F (36.9 C), temperature source Oral, resp. rate 18, SpO2 98 %.  HEENT-  Normocephalic, no lesions, without obvious abnormality.  Normal external eye and conjunctiva.  Normal TM's bilaterally.  Normal auditory canals and external ears. Normal external nose, mucus membranes and septum.  Normal pharynx. Cardiovascular- S1, S2 normal, pulses palpable throughout   Lungs- chest clear, no wheezing, rales, normal symmetric air entry Abdomen- normal findings: bowel sounds normal Extremities- no  edema Lymph-no adenopathy palpable Musculoskeletal-no joint tenderness, deformity or swelling Skin-warm and dry, no hyperpigmentation, vitiligo, or suspicious lesions  Neurological Examination Mental Status: Alert, oriented, thought content appropriate.  Speech fluent without evidence of aphasia.  Able to follow 3 step commands without difficulty. Cranial Nerves: II: Discs flat bilaterally; Visual fields grossly normal, pupils equal, round, reactive to light and accommodation III,IV, VI: ptosis not present, extra-ocular motions intact bilaterally V,VII: smile symmetric, facial light touch sensation normal bilaterally VIII: hearing normal bilaterally IX,X: uvula rises symmetrically XI: bilateral shoulder shrug XII: midline tongue extension Motor: Right : Upper extremity   5/5    Left:     Upper extremity   5/5  Lower extremity   5/5     Lower extremity   5/5 Tone and bulk:normal tone throughout; no atrophy noted Sensory: Pinprick and light touch intact throughout, bilaterally Deep Tendon Reflexes: 2+ and symmetric throughout Plantars: Right:  downgoing   Left: downgoing Cerebellar: normal finger-to-nose, normal rapid alternating movements and normal heel-to-shin test Gait: not assessed due to multiple leads.        Lab Results: Basic Metabolic Panel:  Recent Labs Lab 01/23/15 1541  NA 139  K 3.5  CL 101  GLUCOSE 160*  BUN 17  CREATININE 0.70    Liver Function Tests: No results for input(s): AST, ALT, ALKPHOS, BILITOT, PROT, ALBUMIN in the last 168 hours. No results for input(s): LIPASE, AMYLASE in the last 168 hours. No results for input(s): AMMONIA in the last 168 hours.  CBC:  Recent Labs Lab 01/23/15 1530 01/23/15 1541  WBC 7.0  --   NEUTROABS 4.9  --   HGB 17.7* 16.7  HCT 49.3 49.0  MCV 81.8  --   PLT 172  --     Cardiac Enzymes: No results for input(s): CKTOTAL, CKMB, CKMBINDEX, TROPONINI in the last 168 hours.  Lipid Panel: No results for input(s): CHOL, TRIG, HDL, CHOLHDL, VLDL, LDLCALC in the last 168 hours.  CBG:  Recent Labs Lab 01/23/15 1552  GLUCAP 157*    Microbiology: No results found for this or any previous visit.  Coagulation Studies: No results for input(s): LABPROT, INR in the last 72 hours.  Imaging: Ct Head Wo Contrast  01/23/2015   CLINICAL DATA:  Headaches and slurred speech  EXAM: CT HEAD WITHOUT CONTRAST  TECHNIQUE: Contiguous axial images were obtained from the base of the skull through the vertex without intravenous contrast.  COMPARISON:  01/04/2013  FINDINGS: The bony calvarium is intact. No gross soft tissue abnormality is noted. No findings to suggest acute hemorrhage, acute infarction or space-occupying mass lesion are noted.  IMPRESSION: No acute intracranial abnormality is noted.  These results were called by telephone at the time of interpretation on 01/23/2015 at 3:50 pm to Dr. Rubin Payor, who verbally acknowledged these results.   Electronically Signed   By: Alcide Clever M.D.   On: 01/23/2015 15:51       Assessment and plan discussed with with attending  physician and they are in agreement.    Felicie Morn PA-C [facility] [phone]  01/23/2015, 4:03 PM   Assessment: 44 y.o. male with frequent HA presenting to ED with elevated BP/HA and transient slurred speech and left arm tingling. On arrival symptoms had resolved and exam non-focal. tPA was not administered due to resolved symptoms. Sx likely secondary complicated migraine but cannot rule out CVA with risk factors and elevated BP.   Stroke Risk Factors - diabetes mellitus and hypertension  1) MRI  brain w/o contrast--if negative no further stroke work up 2) BP control 3) treat HA symptomatically 4) will need out patient neurology follow up at time of discharge for frequent HA and HA Px  Patient seen and examined together with physician assistant and I concur with the assessment and plan.  Wyatt Portela, MD

## 2015-01-23 NOTE — ED Notes (Addendum)
Pt c/o slurred speech and "thick tongue" at 1330. Reports one episode of L arm tingling while at work this afternoon that last about an hour and resolved. Pt also c/o headache since waking up this morning; hx of migraines which is normally relieved with BC powders. Has taken BC powder x 6 without relief. Initial BP for EMS 214/148. Reports similar episodes in the past when "my blood pressure spikes."

## 2015-01-23 NOTE — H&P (Signed)
[facility] History and Physical  ANTONEO LAVELLE FPO:[id number] DOB: Jan 25, 1971 DOA: 01/23/2015   PCP: Leo Grosser, MD    Chief Complaint: slurred speech  HPI: Keith [name] is a 44 y.o. male with PMH of HTN, DM, CAD frequent headaches presents with slurred speech starting about 1:30 today. He's had a headache all day and had intermittent tingling of the left arm this after noon. All symptoms resolved at this point. Code stroke was called but cancelled. Neuro has seen the patient. BP quite high as well. They are requesting hospitalists to admit the patient.    General: The patient denies anorexia, fever, weight loss Cardiac: Denies chest pain, syncope, palpitations, pedal edema  Respiratory: Denies cough, shortness of breath, wheezing GI: Denies severe indigestion/heartburn, abdominal pain, nausea, vomiting, diarrhea and constipation GU: Denies hematuria, incontinence, dysuria  Musculoskeletal: Denies arthritis  Skin: Denies suspicious skin lesions Neurologic: Denies focal weakness or numbness, change in vision Psychiatry: Denies depression or anxiety. Hematologic: no easy bruising or bleeding   Past Medical History  Diagnosis Date  . Hypertension   . Diabetes mellitus   . Coronary artery disease   . Frequent headaches     History reviewed. No pertinent past surgical history.  Social History: smokes 1ppd, drinks alcohol rarely Lives at home with wife.     No Known Allergies  Family History  Problem Relation Age of Onset  . Hypertension Mother   . Hypertension Father       Prior to Admission medications   Medication Sig Start Date End Date Taking? Authorizing Provider  hydrochlorothiazide (HYDRODIURIL) 25 MG tablet TAKE 1 TABLET BY MOUTH EVERY DAY Patient taking differently: Take 25 mg by mouth daily.  09/18/14  Yes Donita Brooks, MD  metFORMIN (GLUCOPHAGE) 1000 MG tablet Take 1 tablet (1,000 mg total) by mouth 2 (two) times daily with a meal.  09/18/14  Yes Donita Brooks, MD  venlafaxine XR (EFFEXOR XR) 75 MG 24 hr capsule Take 1 capsule (75 mg total) by mouth daily with breakfast. Patient not taking: Reported on 01/23/2015 09/18/14   Donita Brooks, MD     Physical Exam: Filed Vitals:   01/23/15 1601 01/23/15 1615 01/23/15 1645 01/23/15 1700  BP:  164/100 160/98 157/94  Pulse:  78 69 72  Temp: 98.4 F (36.9 C)     TempSrc:      Resp:  19 18 17   SpO2:  98% 98% 97%     General: AAO x3  HEENT: Normocephalic and Atraumatic, Mucous membranes pink                PERRLA; EOM intact; No scleral icterus,                 Nares: Patent, Oropharynx: Clear, Fair Dentition                 Neck: FROM, no cervical lymphadenopathy, thyromegaly, carotid bruit or JVD;  Breasts: deferred CHEST WALL: No tenderness  CHEST: Normal respiration, clear to auscultation bilaterally  HEART: Regular rate and rhythm- 2/6 murmur at apex with no radiation BACK: No kyphosis or scoliosis; no CVA tenderness  GI: Positive Bowel Sounds, soft, non-tender; no masses, no organomegaly Rectal Exam: deferred MSK: No cyanosis, clubbing, or edema Genitalia: not examined  SKIN:  maculo-papular rash on upper back- non pruritic, non- blanching  CNS: Alert and Oriented x 4, Nonfocal exam, CN 2-12 intact  Labs on Admission:  Basic Metabolic Panel:  Recent Labs Lab 01/23/15  1530 01/23/15 1541  NA 138 139  K 3.5 3.5  CL 104 101  CO2 27  --   GLUCOSE 160* 160*  BUN 15 17  CREATININE 0.75 0.70  CALCIUM 9.0  --    Liver Function Tests:  Recent Labs Lab 01/23/15 1530  AST 19  ALT 14  ALKPHOS 67  BILITOT 1.2  PROT 6.8  ALBUMIN 3.8   No results for input(s): LIPASE, AMYLASE in the last 168 hours. No results for input(s): AMMONIA in the last 168 hours. CBC:  Recent Labs Lab 01/23/15 1530 01/23/15 1541  WBC 7.0  --   NEUTROABS 4.9  --   HGB 17.7* 16.7  HCT 49.3 49.0  MCV 81.8  --   PLT 172  --    Cardiac Enzymes:  Recent Labs Lab  01/23/15 1530  TROPONINI 0.07*    BNP (last 3 results) No results for input(s): BNP in the last 8760 hours.  ProBNP (last 3 results) No results for input(s): PROBNP in the last 8760 hours.  CBG:  Recent Labs Lab 01/23/15 1552  GLUCAP 157*    Radiological Exams on Admission: Ct Head Wo Contrast  01/23/2015   CLINICAL DATA:  Headaches and slurred speech  EXAM: CT HEAD WITHOUT CONTRAST  TECHNIQUE: Contiguous axial images were obtained from the base of the skull through the vertex without intravenous contrast.  COMPARISON:  01/04/2013  FINDINGS: The bony calvarium is intact. No gross soft tissue abnormality is noted. No findings to suggest acute hemorrhage, acute infarction or space-occupying mass lesion are noted.  IMPRESSION: No acute intracranial abnormality is noted.  These results were called by telephone at the time of interpretation on 01/23/2015 at 3:50 pm to Dr. Rubin Payor, who verbally acknowledged these results.   Electronically Signed   By: Alcide Clever M.D.   On: 01/23/2015 15:51    EKG: Independently reviewed. Sinus rhythm at 84 bpm  Assessment/Plan Principal Problem:   Slurred speech/  Headache - symptoms resolved - neuro suspecting complicated migraine but would like to r/o CVA- MRI pending  Active Problems:   Hypertensive urgency - already received one dose of labetalol in ER -would give PRN labetalol only if MRI negative for CVA or if systolic > 200    DM (diabetes mellitus) with complications - hold Metformin- place on sliding scale insulin  Murmur - f/u ECHO   Consulted:   Code Status: full code Family Communication: withwife DVT Prophylaxis:Lovenox  Time spent:35 min  Jawanna Dykman, MD [facility]  If 7PM-7AM, please contact night-coverage [email or web address] 01/23/2015, 5:37 PM

## 2015-01-23 NOTE — ED Notes (Signed)
Pt c/o slurred speech and "swollen tongue" starting at 1330 today. Had one episode of L arm tingling while at working last an hour. Reports feeling like "hitting my funny bone." Pt reports waking up this morning with a migraine. Hx of the same that is usually relieved with 3 BC powders; reports taking 6 bc powders without relief. Reports frequent use of BC powder (up to 50 per week) for headaches. Pt noted to be hypertensive with hx of the same. Reports swelling in the tongue and headache when bp increases. At this time, pt reports tongue and lips feel better. Pt appears clear with some slurring of words. No other neuro deficits

## 2015-01-23 NOTE — ED Provider Notes (Signed)
CSN: [id number]     Arrival date & time 01/23/15  1529 History   First MD Initiated Contact with Patient 01/23/15 1534     Chief Complaint  Patient presents with  . Code Stroke    HPI  Patient presents as a code stroke. Patient developed headache any hours prior to admission, but approximately 1.5 hours ago he developed speech difficulty, with slurring. He also describes tingling in his left arm, but no pain or weakness in either upper extremity. No confusion, disorientation, no syncope. No other changes from baseline medical condition. precipitant. Patient has history of prior headache, without prior stroke.   Patient was evaluated on arrival and again after CT.   Past Medical History  Diagnosis Date  . Hypertension   . Diabetes mellitus   . Coronary artery disease   . Frequent headaches    History reviewed. No pertinent past surgical history. Family History  Problem Relation Age of Onset  . Hypertension Mother   . Hypertension Father    History  Substance Use Topics  . Smoking status: Current Every Day Smoker -- 0.50 packs/day  . Smokeless tobacco: Not on file  . Alcohol Use: No    Review of Systems  Constitutional:       Per HPI, otherwise negative  HENT:       Per HPI, otherwise negative  Respiratory:       Per HPI, otherwise negative  Cardiovascular:       Per HPI, otherwise negative  Gastrointestinal: Negative for vomiting.  Endocrine:       Negative aside from HPI  Genitourinary:       Neg aside from HPI   Musculoskeletal:       Per HPI, otherwise negative  Skin: Negative.   Neurological: Positive for speech difficulty and headaches. Negative for dizziness, syncope and facial asymmetry.      Allergies  Review of patient's allergies indicates no known allergies.  Home Medications   Prior to Admission medications   Medication Sig Start Date End Date Taking? Authorizing Provider  hydrochlorothiazide (HYDRODIURIL) 25 MG tablet TAKE 1 TABLET BY  MOUTH EVERY DAY Patient taking differently: Take 25 mg by mouth daily.  09/18/14  Yes Donita Brooks, MD  metFORMIN (GLUCOPHAGE) 1000 MG tablet Take 1 tablet (1,000 mg total) by mouth 2 (two) times daily with a meal. 09/18/14  Yes Donita Brooks, MD  venlafaxine XR (EFFEXOR XR) 75 MG 24 hr capsule Take 1 capsule (75 mg total) by mouth daily with breakfast. Patient not taking: Reported on 01/23/2015 09/18/14   Donita Brooks, MD   BP 157/94 mmHg  Pulse 72  Temp(Src) 98.4 F (36.9 C) (Oral)  Resp 17  SpO2 97% Physical Exam  Constitutional: He is oriented to person, place, and time. He appears well-developed. No distress.  HENT:  Head: Normocephalic and atraumatic.  Eyes: Conjunctivae and EOM are normal.  Cardiovascular: Normal rate and regular rhythm.   Pulmonary/Chest: Effort normal. No stridor. No respiratory distress.  Abdominal: He exhibits no distension.  Musculoskeletal: He exhibits no edema.  Neurological: He is alert and oriented to person, place, and time. He displays no atrophy and no tremor. A cranial nerve deficit is present. He exhibits normal muscle tone. He displays no seizure activity. Coordination normal.  Speech is garbled- patient differentiates this from tru word finding difficulty.  Skin: Skin is warm and dry.  Psychiatric: He has a normal mood and affect.  Nursing note and vitals reviewed.  ED Course  Procedures (including critical care time) Labs Review Labs Reviewed  CBC - Abnormal; Notable for the following:    RBC 6.03 (*)    Hemoglobin 17.7 (*)    All other components within normal limits  COMPREHENSIVE METABOLIC PANEL - Abnormal; Notable for the following:    Glucose, Bld 160 (*)    All other components within normal limits  TROPONIN I - Abnormal; Notable for the following:    Troponin I 0.07 (*)    All other components within normal limits  I-STAT CHEM 8, ED - Abnormal; Notable for the following:    Glucose, Bld 160 (*)    Calcium, Ion 1.07 (*)     All other components within normal limits  CBG MONITORING, ED - Abnormal; Notable for the following:    Glucose-Capillary 157 (*)    All other components within normal limits  ETHANOL  PROTIME-INR  APTT  DIFFERENTIAL  URINE RAPID DRUG SCREEN (HOSP PERFORMED)  URINALYSIS, ROUTINE W REFLEX MICROSCOPIC  HEMOGLOBIN A1C  LIPID PANEL  BASIC METABOLIC PANEL  CBC  I-STAT TROPOININ, ED  I-STAT TROPOININ, ED    Imaging Review Ct Head Wo Contrast  01/23/2015   CLINICAL DATA:  Headaches and slurred speech  EXAM: CT HEAD WITHOUT CONTRAST  TECHNIQUE: Contiguous axial images were obtained from the base of the skull through the vertex without intravenous contrast.  COMPARISON:  01/04/2013  FINDINGS: The bony calvarium is intact. No gross soft tissue abnormality is noted. No findings to suggest acute hemorrhage, acute infarction or space-occupying mass lesion are noted.  IMPRESSION: No acute intracranial abnormality is noted.  These results were called by telephone at the time of interpretation on 01/23/2015 at 3:50 pm to Dr. Rubin Payor, who verbally acknowledged these results.   Electronically Signed   By: Alcide Clever M.D.   On: 01/23/2015 15:51     EKG Interpretation   Date/Time:  Wednesday January 23 2015 15:49:32 EST Ventricular Rate:  84 PR Interval:  181 QRS Duration: 89 QT Interval:  368 QTC Calculation: 435 R Axis:   62 Text Interpretation:  Sinus rhythm ST elev, probable normal early repol  pattern Sinus rhythm Early repolarization pattern Artifact No significant  change since last tracing Abnormal ekg Confirmed by Gerhard Munch  MD  [phone]) on 01/23/2015 3:55:15 PM     Update: After return from MRI patient was in no distress, but continued to have speech difficulty. Subsequently, the patient's blood pressure improved, speech difficulty resolved.  On repeat exam the patient has pressure 160/98. No speech deficit. I discussed this case with our neurology team again, with normal CT,  patient will have MRI, be admitted for further evaluation and management. MDM   Final diagnoses:  Speech disturbance  Hypertensive urgency    Patient presents to the code stroke after developing new speech difficulty, headache, hypertension. Here the patient does have speech deficit initially, but no other focal neurologic deficits. Patient's CT scan does not demonstrate mass or bleed. Patient's blood pressure improved substantially with labetalol him a fluids. Patient had MRI, was admitted for further evaluation, management.    Gerhard Munch, MD 01/23/15 2328

## 2015-01-23 NOTE — Code Documentation (Signed)
44 year old presents to [facility] as code stroke.  He states he awakened this AM with a headache - describes it as his normal frequent headache that he treats with [facility] powders.  He took 3 and continued to have headache so he took 3 more again without relief.  At about 1330 he noticed some left side arm tingling and his tongue felt "funny".  He felt as if his BP might be elevated.  He presented with HTN in ED - 198/112 - NIHSS 0 - CT scan done.  Dr. Cyril Mourning present.  No focal deficits noted.  No acute stroke treatment.  Handoff to [place].

## 2015-01-23 NOTE — ED Notes (Signed)
Code Stroke cancelled.  

## 2015-01-24 DIAGNOSIS — G43501 Persistent migraine aura without cerebral infarction, not intractable, with status migrainosus: Secondary | ICD-10-CM

## 2015-01-24 LAB — LIPID PANEL
CHOL/HDL RATIO: 4.9 ratio
Cholesterol: 168 mg/dL (ref 0–200)
HDL: 34 mg/dL — AB (ref >39)
LDL Cholesterol: 109 mg/dL — ABNORMAL HIGH (ref 0–99)
Triglycerides: 125 mg/dL (ref <150)
VLDL: 25 mg/dL (ref 0–40)

## 2015-01-24 LAB — URINALYSIS, ROUTINE W REFLEX MICROSCOPIC
BILIRUBIN URINE: NEGATIVE
GLUCOSE, UA: NEGATIVE mg/dL
Hgb urine dipstick: NEGATIVE
KETONES UR: NEGATIVE mg/dL
Leukocytes, UA: NEGATIVE
Nitrite: NEGATIVE
PH: 5 (ref 5.0–8.0)
Protein, ur: NEGATIVE mg/dL
SPECIFIC GRAVITY, URINE: 1.025 (ref 1.005–1.030)
UROBILINOGEN UA: 0.2 mg/dL (ref 0.0–1.0)

## 2015-01-24 LAB — CBC
HCT: 46.6 % (ref 39.0–52.0)
HEMOGLOBIN: 16.4 g/dL (ref 13.0–17.0)
MCH: 29 pg (ref 26.0–34.0)
MCHC: 35.2 g/dL (ref 30.0–36.0)
MCV: 82.5 fL (ref 78.0–100.0)
Platelets: 186 10*3/uL (ref 150–400)
RBC: 5.65 MIL/uL (ref 4.22–5.81)
RDW: 12.8 % (ref 11.5–15.5)
WBC: 7.2 10*3/uL (ref 4.0–10.5)

## 2015-01-24 LAB — BASIC METABOLIC PANEL
Anion gap: 6 (ref 5–15)
BUN: 14 mg/dL (ref 6–23)
CALCIUM: 8.5 mg/dL (ref 8.4–10.5)
CO2: 26 mmol/L (ref 19–32)
Chloride: 105 mmol/L (ref 96–112)
Creatinine, Ser: 0.75 mg/dL (ref 0.50–1.35)
GFR calc non Af Amer: >90 mL/min (ref >90)
GLUCOSE: 132 mg/dL — AB (ref 70–99)
Potassium: 3.3 mmol/L — ABNORMAL LOW (ref 3.5–5.1)
SODIUM: 137 mmol/L (ref 135–145)

## 2015-01-24 LAB — GLUCOSE, CAPILLARY
GLUCOSE-CAPILLARY: 103 mg/dL — AB (ref 70–99)
Glucose-Capillary: 209 mg/dL — ABNORMAL HIGH (ref 70–99)

## 2015-01-24 LAB — RAPID URINE DRUG SCREEN, HOSP PERFORMED
AMPHETAMINES: NOT DETECTED
BARBITURATES: NOT DETECTED
Benzodiazepines: NOT DETECTED
Cocaine: NOT DETECTED
OPIATES: NOT DETECTED
Tetrahydrocannabinol: NOT DETECTED

## 2015-01-24 MED ORDER — LISINOPRIL 10 MG PO TABS: 10.0000 mg | ORAL_TABLET | Freq: Every day | ORAL | Status: DC

## 2015-01-24 MED ORDER — TOPIRAMATE 25 MG PO TABS
25.0000 mg | ORAL_TABLET | Freq: Every day | ORAL | Status: DC
  Administered 2015-01-24: 25 mg via ORAL
  Filled 2015-01-24: qty 1

## 2015-01-24 MED ORDER — TOPIRAMATE 25 MG PO TABS: ORAL_TABLET | ORAL | Status: DC

## 2015-01-24 NOTE — Progress Notes (Signed)
UR completed 

## 2015-01-24 NOTE — Discharge Summary (Signed)
Physician Discharge Summary  Keith [name]:[id number] DOB: Jan 11, 1971 DOA: 01/23/2015  PCP: Keith Ferrier TOM, MD  Admit date: 01/23/2015 Discharge date: 01/24/2015  Time spent: >30 minutes  Recommendations for Outpatient Follow-up:  1.  reassess blood pressure and adjust antihypertensive regimen as needed 2.  check basic metabolic panel to assess electrolytes and renal function (patient has been started on lisinopril) 3.  Patient has been instructed to follow with headache clinic please make sure he has done that 4.  will benefit of eyes examination  Discharge Diagnoses:  Complicated migraine Slurred speech/HA Hypertensive urgency DM (diabetes mellitus) without complications Depression  Vision changes (refractory vision changes/disturbances)   Discharge Condition: stable and improved. Patient advise to minimize/avoid use of NSAID's; will follow with PCP in 10 days and has been instructed to arrange follow up with headache clinic.  Diet recommendation: heart healthy and low carbohydrates   Filed Weights   01/23/15 2300  Weight: 84.188 kg (185 lb 9.6 oz)    History of present illness:  44 y.o. male with PMH of HTN, DM, CAD frequent headaches presents with slurred speech starting about 1:30 today. He's had a headache all day and had intermittent tingling of the left arm this after noon. All symptoms resolved at this point. Code stroke was called but cancelled. Neuro has seen the patient. BP quite high as well. They are requesting hospitalists to admit the patient.   Hospital Course:  1-HA/slurred speech: due to complicated migraine -CT head and MRI negative for acute intracranial abnormalities -per neurology no need to complete stroke work up -recommended starting patient on topamax (with instructions to take 25 mg daily for 1 week and then to increase to 25 mg twice a day) - advised to follow with Headache Clinic as an outpatient for further evaluation and  treatment -continue ASA and BP controlled  2-HTN: with accelerated hypertension/hypertensive urgency in ED -BP stable and controlled at discharge -patient will continue HCTZ and has also been started on lisinopril -advise to follow low sodium diet -PCP to adjust antihypertensive regimen as needed base on BP fluctuation  3-DM type 2: -A1C 6.6 -continue metformin  4-Depression: continue effexor  5-refractory visual changes: advise to have his eye checked; especially been diabetic  Procedures:  See below for x-ray reports   Consultations:  Neurology   Discharge Exam: Filed Vitals:   01/24/15 0922  BP: 142/88  Pulse: 66  Temp: 98.4 F (36.9 C)  Resp: 19   General: afebrile, no HA's, no focal neurologic deficit. Patient reports some vision disturbances (mainly refraction deficit) Cardiovascular: S1 and S2, no rubs, no gallops, no murmurs Respiratory: CTA bilaterally Abd: soft, NT, ND, positive BS Extremities: no edema, no cyanosis  Discharge Instructions   Discharge Instructions    Diet - low sodium heart healthy    Complete by:  As directed      Discharge instructions    Complete by:  As directed   Follow a low sodium diet (goal is less than 2.5-3 gram daily) Take medications as prescribed Arrange follow up with PCP in 10 days (for hospital follow up) Follow with Headache clinic  Check your vision          Current Discharge Medication List    START taking these medications   Details  lisinopril (PRINIVIL) 10 MG tablet Take 1 tablet (10 mg total) by mouth daily. Qty: 30 tablet, Refills: 1    topiramate (TOPAMAX) 25 MG tablet Take 1 tablet by mouth daily X 1 week  and then start taking 1 tablet by mouth twice a day Qty: 50 tablet, Refills: 0      CONTINUE these medications which have NOT CHANGED   Details  hydrochlorothiazide (HYDRODIURIL) 25 MG tablet TAKE 1 TABLET BY MOUTH EVERY DAY Qty: 30 tablet, Refills: 11    metFORMIN (GLUCOPHAGE) 1000 MG tablet  Take 1 tablet (1,000 mg total) by mouth 2 (two) times daily with a meal. Qty: 60 tablet, Refills: 5    venlafaxine XR (EFFEXOR XR) 75 MG 24 hr capsule Take 1 capsule (75 mg total) by mouth daily with breakfast. Qty: 30 capsule, Refills: 1   Associated Diagnoses: Loss of weight       No Known Allergies Follow-up Information    Follow up with [facility] TOM, MD In 10 days.   Specialty:  Family Medicine   Contact information:   [address] [place] [address] [phone]       Follow up with [facility]. Schedule an appointment as soon as possible for a visit in 1 week.   Specialty:  Neurology      The results of significant diagnostics from this hospitalization (including imaging, microbiology, ancillary and laboratory) are listed below for reference.    Significant Diagnostic Studies: Ct Head Wo Contrast  01/23/2015   CLINICAL DATA:  Headaches and slurred speech  EXAM: CT HEAD WITHOUT CONTRAST  TECHNIQUE: Contiguous axial images were obtained from the base of the skull through the vertex without intravenous contrast.  COMPARISON:  01/04/2013  FINDINGS: The bony calvarium is intact. No gross soft tissue abnormality is noted. No findings to suggest acute hemorrhage, acute infarction or space-occupying mass lesion are noted.  IMPRESSION: No acute intracranial abnormality is noted.  These results were called by telephone at the time of interpretation on 01/23/2015 at 3:50 pm to Dr. Rubin Payor, who verbally acknowledged these results.   Electronically Signed   By: Alcide Clever M.D.   On: 01/23/2015 15:51   Mr [name] Head Wo Contrast  01/23/2015   CLINICAL DATA:  Slurred speech.  Left arm tingling.  EXAM: MRI HEAD WITHOUT CONTRAST  MRA HEAD WITHOUT CONTRAST  TECHNIQUE: Multiplanar, multiecho pulse sequences of the brain and surrounding structures were obtained without intravenous contrast. Angiographic images of the head were obtained using MRA  technique without contrast.  COMPARISON:  Head CT ear earlier same day  FINDINGS: MRI HEAD FINDINGS  The brain has normal appearance without evidence of malformation, atrophy, old or acute infarction, mass lesion, hemorrhage, hydrocephalus or extra-axial fluid collection. Paranasal sinuses are clear. There is fluid throughout the mastoid air cells on the left.  MRA HEAD FINDINGS  Both internal carotid arteries are widely patent into the brain. The anterior and middle cerebral vessels are patent without proximal stenosis, aneurysm or vascular malformation. Both vertebral arteries are patent to the basilar. No basilar stenosis. Posterior circulation branch vessels are normal.  IMPRESSION: Normal MRI of the brain itself. Fluid throughout the left mastoid air cells.  Normal intracranial MR angiography.   Electronically Signed   By: Paulina Fusi M.D.   On: 01/23/2015 19:19   Mr Brain Wo Contrast  01/23/2015   CLINICAL DATA:  Slurred speech.  Left arm tingling.  EXAM: MRI HEAD WITHOUT CONTRAST  MRA HEAD WITHOUT CONTRAST  TECHNIQUE: Multiplanar, multiecho pulse sequences of the brain and surrounding structures were obtained without intravenous contrast. Angiographic images of the head were obtained using MRA technique without contrast.  COMPARISON:  Head CT ear earlier same day  FINDINGS: MRI HEAD FINDINGS  The brain has normal appearance without evidence of malformation, atrophy, old or acute infarction, mass lesion, hemorrhage, hydrocephalus or extra-axial fluid collection. Paranasal sinuses are clear. There is fluid throughout the mastoid air cells on the left.  MRA HEAD FINDINGS  Both internal carotid arteries are widely patent into the brain. The anterior and middle cerebral vessels are patent without proximal stenosis, aneurysm or vascular malformation. Both vertebral arteries are patent to the basilar. No basilar stenosis. Posterior circulation branch vessels are normal.  IMPRESSION: Normal MRI of the brain  itself. Fluid throughout the left mastoid air cells.  Normal intracranial MR angiography.   Electronically Signed   By: Paulina Fusi M.D.   On: 01/23/2015 19:19   Labs: Basic Metabolic Panel:  Recent Labs Lab 01/23/15 1530 01/23/15 1541  NA 138 139  K 3.5 3.5  CL 104 101  CO2 27  --   GLUCOSE 160* 160*  BUN 15 17  CREATININE 0.75 0.70  CALCIUM 9.0  --    Liver Function Tests:  Recent Labs Lab 01/23/15 1530  AST 19  ALT 14  ALKPHOS 67  BILITOT 1.2  PROT 6.8  ALBUMIN 3.8   CBC:  Recent Labs Lab 01/23/15 1530 01/23/15 1541  WBC 7.0  --   NEUTROABS 4.9  --   HGB 17.7* 16.7  HCT 49.3 49.0  MCV 81.8  --   PLT 172  --    Cardiac Enzymes:  Recent Labs Lab 01/23/15 1530  TROPONINI 0.07*   CBG:  Recent Labs Lab 01/23/15 1552 01/23/15 2055 01/24/15 0616  GLUCAP 157* 132* 103*    Signed:  Vassie Loll  [facility] 01/24/2015, 10:32 AM

## 2015-01-24 NOTE — Progress Notes (Signed)
D/C orders received. Pt educated on d/c instructions and handed d/c packet. Pt given note from Md regarding work. Pt verbalized understanding. IV and tele removed. Pt taken downstairs by volunteers via wheelchair.

## 2015-01-25 LAB — HEMOGLOBIN A1C
Hgb A1c MFr Bld: 6.6 % — ABNORMAL HIGH (ref 4.8–5.6)
MEAN PLASMA GLUCOSE: 143 mg/dL

## 2015-01-29 ENCOUNTER — Inpatient Hospital Stay: Payer: Managed Care, Other (non HMO) | Admitting: Family Medicine

## 2015-03-16 ENCOUNTER — Other Ambulatory Visit: Payer: Self-pay | Admitting: Family Medicine

## 2015-06-21 ENCOUNTER — Encounter: Payer: Self-pay | Admitting: Family Medicine

## 2015-06-21 ENCOUNTER — Ambulatory Visit (INDEPENDENT_AMBULATORY_CARE_PROVIDER_SITE_OTHER): Payer: Managed Care, Other (non HMO) | Admitting: Family Medicine

## 2015-06-21 VITALS — BP 162/110 | HR 80 | Temp 98.1°F | Resp 18 | Wt 186.0 lb

## 2015-06-21 DIAGNOSIS — H9192 Unspecified hearing loss, left ear: Secondary | ICD-10-CM | POA: Diagnosis not present

## 2015-06-21 DIAGNOSIS — I10 Essential (primary) hypertension: Secondary | ICD-10-CM

## 2015-06-21 DIAGNOSIS — E1165 Type 2 diabetes mellitus with hyperglycemia: Secondary | ICD-10-CM | POA: Diagnosis not present

## 2015-06-21 DIAGNOSIS — IMO0002 Reserved for concepts with insufficient information to code with codable children: Secondary | ICD-10-CM

## 2015-06-21 LAB — CBC WITH DIFFERENTIAL/PLATELET
BASOS PCT: 0 % (ref 0–1)
Basophils Absolute: 0 10*3/uL (ref 0.0–0.1)
Eosinophils Absolute: 0.1 10*3/uL (ref 0.0–0.7)
Eosinophils Relative: 2 % (ref 0–5)
HCT: 48.7 % (ref 39.0–52.0)
HEMOGLOBIN: 17.9 g/dL — AB (ref 13.0–17.0)
LYMPHS PCT: 21 % (ref 12–46)
Lymphs Abs: 1.4 10*3/uL (ref 0.7–4.0)
MCH: 30.5 pg (ref 26.0–34.0)
MCHC: 36.8 g/dL — ABNORMAL HIGH (ref 30.0–36.0)
MCV: 83.1 fL (ref 78.0–100.0)
MONO ABS: 0.5 10*3/uL (ref 0.1–1.0)
MONOS PCT: 7 % (ref 3–12)
MPV: 9.5 fL (ref 8.6–12.4)
Neutro Abs: 4.6 10*3/uL (ref 1.7–7.7)
Neutrophils Relative %: 70 % (ref 43–77)
Platelets: 174 10*3/uL (ref 150–400)
RBC: 5.86 MIL/uL — ABNORMAL HIGH (ref 4.22–5.81)
RDW: 13.9 % (ref 11.5–15.5)
WBC: 6.6 10*3/uL (ref 4.0–10.5)

## 2015-06-21 LAB — COMPLETE METABOLIC PANEL WITH GFR
ALBUMIN: 4 g/dL (ref 3.6–5.1)
ALK PHOS: 60 U/L (ref 40–115)
ALT: 9 U/L (ref 9–46)
AST: 15 U/L (ref 10–40)
BILIRUBIN TOTAL: 1.2 mg/dL (ref 0.2–1.2)
BUN: 11 mg/dL (ref 7–25)
CHLORIDE: 104 mmol/L (ref 98–110)
CO2: 24 mmol/L (ref 20–31)
CREATININE: 0.67 mg/dL (ref 0.60–1.35)
Calcium: 9.2 mg/dL (ref 8.6–10.3)
GFR, Est African American: >89 mL/min (ref >=60)
Glucose, Bld: 147 mg/dL — ABNORMAL HIGH (ref 70–99)
POTASSIUM: 3.8 mmol/L (ref 3.5–5.3)
Sodium: 139 mmol/L (ref 135–146)
Total Protein: 6.3 g/dL (ref 6.1–8.1)

## 2015-06-21 LAB — HEMOGLOBIN A1C
Hgb A1c MFr Bld: 6.1 % — ABNORMAL HIGH (ref <5.7)
Mean Plasma Glucose: 128 mg/dL — ABNORMAL HIGH (ref <117)

## 2015-06-21 NOTE — Progress Notes (Signed)
Subjective:    Patient ID: Keith [name], male    DOB: Mar 20, 1971, 44 y.o.   MRN: [id number]  HPI Patient presents with hearing loss in his left ear now for several months. It is becoming increasingly difficult for him to function at work as he is having a difficult time hearing people. He denies any otalgia. He denies any tinnitus. He denies any vertigo. He states he feels like he has fluid trapped behind his ear. He is constant and tried to shake his ear to get the fluid out. The hearing loss will, and go. Sometimes he will feel his eardrum pop and his hearing will improve. Today the patient has noticeable hearing deficit in his left ear on hearing screen. On examination he has a clear effusion behind his left tympanic membrane. His blood pressure is extremely high today. Patient has not been taking his blood pressure medication. He states that he has been taking metformin for his diabetes but is not checking his blood sugar. Past Medical History  Diagnosis Date  . Hypertension   . Diabetes mellitus   . Coronary artery disease   . Frequent headaches    No past surgical history on file. Current Outpatient Prescriptions on File Prior to Visit  Medication Sig Dispense Refill  . metFORMIN (GLUCOPHAGE) 1000 MG tablet Take 1 tablet (1,000 mg total) by mouth 2 (two) times daily with a meal. 60 tablet 5  . hydrochlorothiazide (HYDRODIURIL) 25 MG tablet TAKE 1 TABLET BY MOUTH EVERY DAY (Patient not taking: Reported on 06/21/2015) 30 tablet 11  . lisinopril (PRINIVIL) 10 MG tablet Take 1 tablet (10 mg total) by mouth daily. (Patient not taking: Reported on 06/21/2015) 30 tablet 1   No current facility-administered medications on file prior to visit.   No Known Allergies History   Social History  . Marital Status: Married    Spouse Name: N/A  . Number of Children: N/A  . Years of Education: N/A   Occupational History  . Not on file.   Social History Main Topics  . Smoking status: Current  Every Day Smoker -- 0.50 packs/day  . Smokeless tobacco: Not on file  . Alcohol Use: No  . Drug Use: No  . Sexual Activity: Not on file   Other Topics Concern  . Not on file   Social History Narrative      Review of Systems  All other systems reviewed and are negative.      Objective:   Physical Exam  Constitutional: He appears well-developed and well-nourished.  HENT:  Right Ear: External ear normal. Tympanic membrane is not injected, not scarred, not perforated, not erythematous, not retracted and not bulging.  Left Ear: External ear normal. Tympanic membrane is not injected, not scarred, not perforated, not erythematous, not retracted and not bulging. Decreased hearing is noted.  Nose: Nose normal.  Mouth/Throat: Oropharynx is clear and moist.  Eyes: Conjunctivae are normal. Pupils are equal, round, and reactive to light.  Neck: Neck supple.  Cardiovascular: Normal rate, regular rhythm and normal heart sounds.   Pulmonary/Chest: Effort normal and breath sounds normal.  Lymphadenopathy:    He has no cervical adenopathy.  Vitals reviewed.         Assessment & Plan:  Diabetes mellitus type II, uncontrolled - Plan: COMPLETE METABOLIC PANEL WITH GFR, CBC with Differential/Platelet, Hemoglobin A1c  Hearing loss in left ear - Plan: Ambulatory referral to ENT  Benign essential HTN  I believe the patient has hearing loss secondary  to eustachian tube dysfunction. I would recommend an ear nose and throat physician consultation to discuss tympanostomy tube placement and for formal audiology consultation. Patient's blood pressure is uncontrolled. I recommended he resume hydrochlorothiazide 25 mg by mouth daily and lisinopril 10 mg by mouth daily and recheck his blood pressure here in 2 weeks. Patient is noncompliant with his diabetes management. I will check a hemoglobin A1c will the patient is here today to determine if his diabetes is well controlled.

## 2015-06-24 ENCOUNTER — Other Ambulatory Visit: Payer: Self-pay | Admitting: Family Medicine

## 2015-06-24 MED ORDER — HYDROCHLOROTHIAZIDE 25 MG PO TABS: 25.0000 mg | ORAL_TABLET | Freq: Every day | ORAL | Status: DC

## 2015-06-24 MED ORDER — LISINOPRIL 10 MG PO TABS: 10.0000 mg | ORAL_TABLET | Freq: Every day | ORAL | Status: DC

## 2016-01-23 ENCOUNTER — Encounter: Payer: Self-pay | Admitting: Emergency Medicine

## 2016-01-23 ENCOUNTER — Emergency Department: Payer: Worker's Compensation

## 2016-01-23 DIAGNOSIS — W450XXA Nail entering through skin, initial encounter: Secondary | ICD-10-CM | POA: Diagnosis not present

## 2016-01-23 DIAGNOSIS — Z79899 Other long term (current) drug therapy: Secondary | ICD-10-CM | POA: Diagnosis not present

## 2016-01-23 DIAGNOSIS — F17210 Nicotine dependence, cigarettes, uncomplicated: Secondary | ICD-10-CM | POA: Diagnosis not present

## 2016-01-23 DIAGNOSIS — Z7984 Long term (current) use of oral hypoglycemic drugs: Secondary | ICD-10-CM | POA: Insufficient documentation

## 2016-01-23 DIAGNOSIS — S91332A Puncture wound without foreign body, left foot, initial encounter: Secondary | ICD-10-CM | POA: Insufficient documentation

## 2016-01-23 DIAGNOSIS — Y9289 Other specified places as the place of occurrence of the external cause: Secondary | ICD-10-CM | POA: Insufficient documentation

## 2016-01-23 DIAGNOSIS — Y9301 Activity, walking, marching and hiking: Secondary | ICD-10-CM | POA: Diagnosis not present

## 2016-01-23 DIAGNOSIS — I10 Essential (primary) hypertension: Secondary | ICD-10-CM | POA: Diagnosis not present

## 2016-01-23 DIAGNOSIS — E119 Type 2 diabetes mellitus without complications: Secondary | ICD-10-CM | POA: Insufficient documentation

## 2016-01-23 DIAGNOSIS — S99922A Unspecified injury of left foot, initial encounter: Secondary | ICD-10-CM | POA: Diagnosis present

## 2016-01-23 DIAGNOSIS — Y990 Civilian activity done for income or pay: Secondary | ICD-10-CM | POA: Insufficient documentation

## 2016-01-23 DIAGNOSIS — Z23 Encounter for immunization: Secondary | ICD-10-CM | POA: Diagnosis not present

## 2016-01-23 NOTE — ED Notes (Addendum)
Pt presents to ED after he removed a nail from the bottom of his left foot earlier this afternoon around 1420. Pt states he was walking at work on a trailer with boots on and a nail went through his boot and his foot. Pt states he "had to work hard to get it out". Pt states he does not think his tetanus is up to date. 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 Denies bleeding currently. Pt states he noticed a white powder in the trailer where the nail had been and states the pallet this nail came off of held a flamable material that is possibly poisonous. Unsure of exactly what potentially hazardous material could be but he is concerned it may be in his foot. Pt states he has changed his clothing and his shoes since initial injury.

## 2016-01-24 ENCOUNTER — Emergency Department
Admission: EM | Admit: 2016-01-24 | Discharge: 2016-01-24 | Disposition: A | Discharge status: Home / Self Care | Payer: Worker's Compensation | Attending: Emergency Medicine | Admitting: Emergency Medicine

## 2016-01-24 DIAGNOSIS — S91332A Puncture wound without foreign body, left foot, initial encounter: Secondary | ICD-10-CM

## 2016-01-24 DIAGNOSIS — R52 Pain, unspecified: Secondary | ICD-10-CM

## 2016-01-24 MED ORDER — CIPROFLOXACIN HCL 500 MG PO TABS
500.0000 mg | ORAL_TABLET | Freq: Once | ORAL | Status: AC
  Administered 2016-01-24: 500 mg via ORAL
  Filled 2016-01-24: qty 1

## 2016-01-24 MED ORDER — TRAMADOL HCL 50 MG PO TABS: 50.0000 mg | ORAL_TABLET | Freq: Four times a day (QID) | ORAL | Status: DC | PRN

## 2016-01-24 MED ORDER — TETANUS-DIPHTH-ACELL PERTUSSIS 5-2.5-18.5 LF-MCG/0.5 IM SUSP
0.5000 mL | Freq: Once | INTRAMUSCULAR | Status: AC
  Administered 2016-01-24: 0.5 mL via INTRAMUSCULAR
  Filled 2016-01-24: qty 0.5

## 2016-01-24 MED ORDER — CIPROFLOXACIN HCL 500 MG PO TABS: 500.0000 mg | ORAL_TABLET | Freq: Two times a day (BID) | ORAL | Status: AC

## 2016-01-24 NOTE — Discharge Instructions (Signed)
Puncture Wound °A puncture wound is an injury that is caused by a sharp, thin object that goes through your skin, such as a nail. A puncture wound usually does not leave a large opening in your skin, so it may not bleed a lot. However, when you get a puncture wound, dirt or other materials (foreign bodies) can be forced into your wound and break off inside. This makes it more likely that an infection will happen, such as tetanus. °HOME CARE °Medicines  °· Take or apply over-the-counter and prescription medicines only as told by your doctor. °· If you were prescribed an antibiotic medicine, take or apply it as told by your doctor. Do not stop using the antibiotic even if your condition starts to get better. °Wound Care  °· There are many ways to close and cover a wound. For example, a wound can be covered with stitches (sutures), skin glue, or adhesive strips. Follow instructions from your doctor about: °¨ How to take care of your wound. °¨ When and how you should change your bandage (dressing). °¨ When you should remove your bandage. °¨ Removing whatever was used to close your wound. °· Keep the bandage dry as told by your doctor. Do not take baths, swim, use a hot tub, or do anything that would put your wound underwater until your doctor says it is okay. °· Clean the wound as told by your doctor. °· Do not scratch or pick at the wound. °· Check your wound every day for signs of infection. Watch for: °¨ Redness, swelling, or pain. °¨ Fluid, blood, or pus. °General Instructions  °· Raise (elevate) the injured area above the level of your heart while you are sitting or lying down. °· If your puncture wound is in your foot, ask your doctor if you need to avoid putting weight on your foot and for how long. °· Keep all follow-up visits as told by your doctor. This is important. °GET HELP IF: °· You got a tetanus shot and you have any of these problems at the injection site: °¨ Swelling. °¨ Very bad  pain. °¨ Redness. °¨ Bleeding. °· You have a fever. °· Your stitches come out. °· You notice a bad smell coming from your wound or your bandage. °· You notice something coming out of the wound, such as wood or glass. °· Medicine does not help your pain. °· You have more redness, swelling, or pain at the site of your wound. °· You have fluid, blood, or pus coming from your wound. °· You notice a change in the color of your skin near your wound. °· You need to change the bandage often because fluid, blood, or pus is coming from the wound. °· You start to have a new rash. °· You start to have numbness around the wound. °GET HELP RIGHT AWAY IF: °· You have very bad swelling around the wound. °· Your pain suddenly gets worse and is very bad. °· You start to get painful skin lumps. °· You have a red streak going away from your wound. °· The wound is on your hand or foot and you cannot move a finger or toe like you usually can. °· The wound is on your hand or foot and you notice that your fingers or toes look pale or bluish. °  °This information is not intended to replace advice given to you by your health care provider. Make sure you discuss any questions you have with your health care provider. °  °Document   Released: 08/11/2008 Document Revised: 07/24/2015 Document Reviewed: 12/26/2014 °Elsevier Interactive Patient Education ©2016 [employer] ° °

## 2016-01-24 NOTE — ED Notes (Signed)
  Reviewed d/c instructions, follow-up care, and prescriptions with pt. Pt verbalized understanding 

## 2016-01-24 NOTE — ED Provider Notes (Signed)
[facility] Emergency Department Provider Note  Time seen: 2:03 AM  I have reviewed the triage vital signs and the nursing notes.   HISTORY  Chief Complaint Foot Pain and Puncture Wound    HPI Keith [name] is a 45 y.o. male with a past medical history of hypertension and diabetes who presents the emergency department with a left foot wound. According to the patient around 2:20 PM while at work he stepped on a nail which went through his boot into his left foot. Patient continued to work the rest of the day and came to the emergency department tonight for evaluation. States progressive soreness in the foot. But he still able to walk on it.Patient told the nurse there is several chemicals and powders in the area but he does not believe anything got into the foot. Patient states his last tetanus shot was as a child. Describes a soreness as moderate.     Past Medical History  Diagnosis Date  . Hypertension   . Diabetes mellitus   . Coronary artery disease   . Frequent headaches     Patient Active Problem List   Diagnosis Date Noted  . Migraine aura, persistent, with status migrainosus   . Hypertensive urgency 01/23/2015  . Slurred speech 01/23/2015  . Headache 01/23/2015  . DM (diabetes mellitus) with complications (HCC) 01/23/2015  . TIA (transient ischemic attack) 01/23/2015    History reviewed. No pertinent past surgical history.  Current Outpatient Rx  Name  Route  Sig  Dispense  Refill  . hydrochlorothiazide (HYDRODIURIL) 25 MG tablet   Oral   Take 1 tablet (25 mg total) by mouth daily.   30 tablet   11   . lisinopril (PRINIVIL) 10 MG tablet   Oral   Take 1 tablet (10 mg total) by mouth daily.   30 tablet   11   . metFORMIN (GLUCOPHAGE) 1000 MG tablet   Oral   Take 1 tablet (1,000 mg total) by mouth 2 (two) times daily with a meal.   60 tablet   5     Allergies Review of patient's allergies indicates no known  allergies.  Family History  Problem Relation Age of Onset  . Hypertension Mother   . Hypertension Father     Social History Social History  Substance Use Topics  . Smoking status: Current Every Day Smoker -- 0.50 packs/day    Types: Cigarettes  . Smokeless tobacco: Never Used  . Alcohol Use: No    Review of Systems Constitutional: Negative for fever. Cardiovascular: Negative for chest pain Gastrointestinal: Negative for abdominal pain Musculoskeletal: Moderate left foot soreness. 10-point ROS otherwise negative.  ____________________________________________   PHYSICAL EXAM:  VITAL SIGNS: ED Triage Vitals  Enc Vitals Group     BP 01/23/16 2303 164/108 mmHg     Pulse Rate 01/23/16 2303 80     Resp 01/23/16 2303 18     Temp 01/23/16 2303 98.6 F (37 C)     Temp Source 01/23/16 2303 Oral     SpO2 01/23/16 2303 97 %     Weight 01/23/16 2303 167 lb (75.751 kg)     Height 01/23/16 2303 5\' 11"  (1.803 m)     Head Cir --      Peak Flow --      Pain Score 01/23/16 2304 3     Pain Loc --      Pain Edu? --      Excl. in GC? --  Constitutional: Alert and oriented. Well appearing and in no distress. Eyes: Normal exam ENT   Head: Normocephalic and atraumatic.   Mouth/Throat: Mucous membranes are moist. Cardiovascular: Normal rate, regular rhythm.  Respiratory: Normal respiratory effort without tachypnea nor retractions. Breath sounds are clear Gastrointestinal: Soft and nontender. No distention.  Musculoskeletal: Nontender with normal range of motion in all extremities. Small puncture wound present to the plantar aspect of left foot. No erythema or induration, no signs of infection. Neurologic:  Normal speech and language. No gross focal neurologic deficits Skin:  Skin is warm, dry  Psychiatric: Mood and affect are normal.  ____________________________________________   RADIOLOGY  X-ray negative   INITIAL IMPRESSION / ASSESSMENT AND PLAN / ED  COURSE  Pertinent labs & imaging results that were available during my care of the patient were reviewed by me and considered in my medical decision making (see chart for details).  Patient presents to emergency department after a puncture wound to left foot. No signs of active infection. X-ray negative for foreign body. We will start the patient on ciprofloxacin prophylaxis, as well as Ultram as needed. We will update the patient's tetanus shot in the emergency department.  ____________________________________________   FINAL CLINICAL IMPRESSION(S) / ED DIAGNOSES  Foot puncture wound   Minna Antis, MD 01/24/16 1610

## 2016-01-24 NOTE — ED Notes (Signed)
Pt reports he stepped on a board with a nail in it a 1400 today (left foot).  Pt reports approx 1" of nail went into his foot. Pt reports he had small amount of bleeding. Pt reports pain has gotten progressively worse through out the day. Pt currently c/o of pain at 4 out of 10 in the left foot. Pt has small puncture site on bottom of left foot at the ball of the foot between the 2nd and 3rd toe. Pt reports hx of diabetes.

## 2016-07-04 ENCOUNTER — Other Ambulatory Visit: Payer: Self-pay | Admitting: Family Medicine

## 2016-07-06 NOTE — Telephone Encounter (Signed)
Medication refill for one time only.  Patient needs to be seen.  Letter sent for patient to call and schedule 

## 2016-07-16 ENCOUNTER — Telehealth: Payer: Self-pay | Admitting: Family Medicine

## 2016-07-16 MED ORDER — LISINOPRIL 10 MG PO TABS
10.0000 mg | ORAL_TABLET | Freq: Every day | ORAL | 0 refills | Status: DC
Start: 2016-07-16 — End: 2016-12-16

## 2016-07-16 MED ORDER — HYDROCHLOROTHIAZIDE 25 MG PO TABS
25.0000 mg | ORAL_TABLET | Freq: Every day | ORAL | 0 refills | Status: DC
Start: 2016-07-16 — End: 2016-12-16

## 2016-07-16 NOTE — Telephone Encounter (Signed)
Mr. Locke called saying he's completely out of both blood pressure medications and needs a refill sent to his pharmacy. He's scheduled to come in on September 18th. I informed him that if a refill is sent to his pharmacy, it may only be enough medication to last him until he comes in the office. He said he has to wait a couple of weeks due to going out of town for his job. Please call Mr. Beiler if needed.  Pt's ph# [phone] Thank you.

## 2016-07-16 NOTE — Telephone Encounter (Signed)
Medication filled x1 with no refills.   Requires office visit before any further refills can be given.  

## 2016-08-03 ENCOUNTER — Ambulatory Visit: Payer: Managed Care, Other (non HMO) | Admitting: Family Medicine

## 2016-08-04 ENCOUNTER — Ambulatory Visit: Payer: Managed Care, Other (non HMO) | Admitting: Family Medicine

## 2016-12-04 ENCOUNTER — Ambulatory Visit: Payer: Managed Care, Other (non HMO) | Admitting: Family Medicine

## 2016-12-14 ENCOUNTER — Encounter: Payer: Self-pay | Admitting: Family Medicine

## 2016-12-14 ENCOUNTER — Ambulatory Visit (INDEPENDENT_AMBULATORY_CARE_PROVIDER_SITE_OTHER): Payer: Managed Care, Other (non HMO) | Admitting: Family Medicine

## 2016-12-14 VITALS — BP 180/100 | HR 100 | Temp 98.7°F | Resp 16 | Wt 167.0 lb

## 2016-12-14 DIAGNOSIS — G8929 Other chronic pain: Secondary | ICD-10-CM

## 2016-12-14 DIAGNOSIS — R634 Abnormal weight loss: Secondary | ICD-10-CM

## 2016-12-14 DIAGNOSIS — R1013 Epigastric pain: Secondary | ICD-10-CM

## 2016-12-14 DIAGNOSIS — E1100 Type 2 diabetes mellitus with hyperosmolarity without nonketotic hyperglycemic-hyperosmolar coma (NKHHC): Secondary | ICD-10-CM | POA: Diagnosis not present

## 2016-12-14 LAB — CBC WITH DIFFERENTIAL/PLATELET
BASOS PCT: 0 %
Basophils Absolute: 0 cells/uL (ref 0–200)
EOS ABS: 177 {cells}/uL (ref 15–500)
Eosinophils Relative: 3 %
HEMATOCRIT: 49.6 % (ref 38.5–50.0)
Hemoglobin: 16.6 g/dL (ref 13.0–17.0)
LYMPHS ABS: 1121 {cells}/uL (ref 850–3900)
Lymphocytes Relative: 19 %
MCH: 28.9 pg (ref 27.0–33.0)
MCHC: 33.5 g/dL (ref 32.0–36.0)
MCV: 86.3 fL (ref 80.0–100.0)
MONO ABS: 413 {cells}/uL (ref 200–950)
MPV: 9.3 fL (ref 7.5–12.5)
Monocytes Relative: 7 %
NEUTROS ABS: 4189 {cells}/uL (ref 1500–7800)
Neutrophils Relative %: 71 %
Platelets: 169 10*3/uL (ref 140–400)
RBC: 5.75 MIL/uL (ref 4.20–5.80)
RDW: 13.8 % (ref 11.0–15.0)
WBC: 5.9 10*3/uL (ref 3.8–10.8)

## 2016-12-14 LAB — COMPLETE METABOLIC PANEL WITH GFR
ALT: 10 U/L (ref 9–46)
AST: 16 U/L (ref 10–40)
Albumin: 3.8 g/dL (ref 3.6–5.1)
Alkaline Phosphatase: 64 U/L (ref 40–115)
BUN: 8 mg/dL (ref 7–25)
CALCIUM: 8.8 mg/dL (ref 8.6–10.3)
CO2: 26 mmol/L (ref 20–31)
CREATININE: 0.72 mg/dL (ref 0.60–1.35)
Chloride: 104 mmol/L (ref 98–110)
GFR, Est Non African American: >89 mL/min (ref >=60)
Glucose, Bld: 107 mg/dL — ABNORMAL HIGH (ref 70–99)
Potassium: 3.5 mmol/L (ref 3.5–5.3)
Sodium: 141 mmol/L (ref 135–146)
TOTAL PROTEIN: 6.2 g/dL (ref 6.1–8.1)
Total Bilirubin: 0.8 mg/dL (ref 0.2–1.2)

## 2016-12-14 LAB — HEMOGLOBIN A1C
HEMOGLOBIN A1C: 5.1 % (ref <5.7)
MEAN PLASMA GLUCOSE: 100 mg/dL

## 2016-12-14 NOTE — Progress Notes (Signed)
Subjective:    Patient ID: Keith [name], male    DOB: [birth year], 46 y.o.   MRN: [id number]  HPI  06/2015 Patient presents with hearing loss in his left ear now for several months. It is becoming increasingly difficult for him to function at work as he is having a difficult time hearing people. He denies any otalgia. He denies any tinnitus. He denies any vertigo. He states he feels like he has fluid trapped behind his ear. He is constant and tried to shake his ear to get the fluid out. The hearing loss will, and go. Sometimes he will feel his eardrum pop and his hearing will improve. Today the patient has noticeable hearing deficit in his left ear on hearing screen. On examination he has a clear effusion behind his left tympanic membrane. His blood pressure is extremely high today. Patient has not been taking his blood pressure medication. He states that he has been taking metformin for his diabetes but is not checking his blood sugar.  At that time, my plan was: I believe the patient has hearing loss secondary to eustachian tube dysfunction. I would recommend an ear nose and throat physician consultation to discuss tympanostomy tube placement and for formal audiology consultation. Patient's blood pressure is uncontrolled. I recommended he resume hydrochlorothiazide 25 mg by mouth daily and lisinopril 10 mg by mouth daily and recheck his blood pressure here in 2 weeks. Patient is noncompliant with his diabetes management. I will check a hemoglobin A1c will the patient is here today to determine if his diabetes is well controlled.  12/14/16 Patient has not been seen since.  At that time, HgA1c was 6.1.  Patient has stopped all of his meds including HCTZ, Lisinopril, and metformin.  Patient continues to lose weight. He states that he is having dysphasia. Patient reports early satiety after only eating a few bites. He also reports excessive indigestion and bloating after eating only a few bites. He has  very little appetite. He denies any severe abdominal pain although he has chronic epigastric abdominal discomfort. He states this is been going on since 2013 but is getting worse. He also reports frequent indigestion. He denies any melena. He denies any hematochezia. He denies any diarrhea. He does report constipation. Occasionally he can go a week without having a bowel movement Past Medical History:  Diagnosis Date  . Coronary artery disease   . Diabetes mellitus   . Frequent headaches   . Hypertension    No past surgical history on file. Current Outpatient Prescriptions on File Prior to Visit  Medication Sig Dispense Refill  . hydrochlorothiazide (HYDRODIURIL) 25 MG tablet Take 1 tablet (25 mg total) by mouth daily. 30 tablet 0  . lisinopril (PRINIVIL,ZESTRIL) 10 MG tablet Take 1 tablet (10 mg total) by mouth daily. 30 tablet 0  . metFORMIN (GLUCOPHAGE) 1000 MG tablet Take 1 tablet (1,000 mg total) by mouth 2 (two) times daily with a meal. 60 tablet 5  . traMADol (ULTRAM) 50 MG tablet Take 1 tablet (50 mg total) by mouth every 6 (six) hours as needed. 20 tablet 0   No current facility-administered medications on file prior to visit.    No Known Allergies Social History   Social History  . Marital status: Married    Spouse name: N/A  . Number of children: N/A  . Years of education: N/A   Occupational History  . Not on file.   Social History Main Topics  . Smoking status: Current  Every Day Smoker    Packs/day: 0.50    Types: Cigarettes  . Smokeless tobacco: Never Used  . Alcohol use No  . Drug use: No  . Sexual activity: Not on file   Other Topics Concern  . Not on file   Social History Narrative  . No narrative on file      Review of Systems  All other systems reviewed and are negative.      Objective:   Physical Exam  Constitutional: He appears well-developed and well-nourished.  HENT:  Right Ear: External ear normal. Tympanic membrane is not injected, not  scarred, not perforated, not erythematous, not retracted and not bulging.  Left Ear: External ear normal. Tympanic membrane is not injected, not scarred, not perforated, not erythematous, not retracted and not bulging. Decreased hearing is noted.  Nose: Nose normal.  Mouth/Throat: Oropharynx is clear and moist.  Eyes: Conjunctivae are normal. Pupils are equal, round, and reactive to light.  Neck: Neck supple.  Cardiovascular: Normal rate, regular rhythm and normal heart sounds.   Pulmonary/Chest: Effort normal and breath sounds normal.  Lymphadenopathy:    He has no cervical adenopathy.  Vitals reviewed.         Assessment & Plan:  Uncontrolled type 2 diabetes mellitus with hyperosmolarity without coma, without long-term current use of insulin (HCC) - Plan: CBC with Differential/Platelet, COMPLETE METABOLIC PANEL WITH GFR, Hemoglobin A1c  Abdominal pain, chronic, epigastric - Plan: Lipase  I am very concerned about the patient's blood pressure and his weight loss. Obtain a CBC, CMP, hemoglobin A1c, and a lipase. Differential diagnosis includes intestinal obstruction due to mass, stricture, malignancy.. Biliary dyskinesia is also on the differential along with chronic pancreatitis although the patient does not drink.  However I'm most concerned about gastroparesis given the duration of symptoms and his history of uncontrolled diabetes. I believe the most appropriate first step would be to have the patient see a gastroenterologist for an EGD to rule out stricture and mass. I believe he would then benefit from a barium swallow to evaluate for gastroparesis. May also need a CAT scan of the abdomen and pelvis. Begin with GI referral first. Temporarily start the patient on dexilant externally milligrams a day. Once I have the lab work back tomorrow, I will make recommendations regarding antihypertensive medication but I will check the patient's renal function first

## 2016-12-15 LAB — LIPASE: Lipase: 39 U/L (ref 7–60)

## 2016-12-16 ENCOUNTER — Telehealth: Payer: Self-pay | Admitting: Family Medicine

## 2016-12-16 MED ORDER — LOSARTAN POTASSIUM-HCTZ 100-12.5 MG PO TABS: 1.0000 | ORAL_TABLET | Freq: Every day | ORAL | 3 refills | Status: DC

## 2016-12-16 NOTE — Telephone Encounter (Signed)
Pt wants to know if his lab results are ready.

## 2016-12-16 NOTE — Telephone Encounter (Signed)
Tried to call pt and it was work # and I did not know how to get in touch with him at work so called and LMOVM on cell # to [facility]

## 2016-12-16 NOTE — Telephone Encounter (Signed)
Patient aware of results and med sent to pharm 

## 2016-12-18 ENCOUNTER — Telehealth: Payer: Self-pay | Admitting: Family Medicine

## 2016-12-18 NOTE — Telephone Encounter (Signed)
Pt wanted to let us know that the hyzaar is working.

## 2016-12-21 ENCOUNTER — Encounter: Payer: Self-pay | Admitting: Family Medicine

## 2016-12-22 MED ORDER — DEXLANSOPRAZOLE 60 MG PO CPDR: 60.0000 mg | DELAYED_RELEASE_CAPSULE | Freq: Every day | ORAL | 5 refills | Status: DC

## 2016-12-22 MED ORDER — PANTOPRAZOLE SODIUM 40 MG PO TBEC: 40.0000 mg | DELAYED_RELEASE_TABLET | Freq: Every day | ORAL | 11 refills | Status: DC

## 2016-12-22 NOTE — Addendum Note (Signed)
Addended by: Legrand Rams B on: 12/22/2016 04:34 PM   Modules accepted: Orders

## 2016-12-23 ENCOUNTER — Telehealth: Payer: Self-pay

## 2016-12-23 NOTE — Telephone Encounter (Signed)
Severe abdominal pain, epigastric pain, & loosing weight. Referred by [facility]. Appointment made but if a EGD/colonoscopy can be scheduled prior to that, it is preferred.

## 2016-12-25 NOTE — Telephone Encounter (Signed)
LVM for pt to return my call.

## 2016-12-30 ENCOUNTER — Emergency Department
Admission: EM | Admit: 2016-12-30 | Discharge: 2016-12-30 | Disposition: A | Discharge status: Home / Self Care | Payer: Managed Care, Other (non HMO) | Attending: Emergency Medicine | Admitting: Emergency Medicine

## 2016-12-30 ENCOUNTER — Emergency Department: Payer: Managed Care, Other (non HMO)

## 2016-12-30 DIAGNOSIS — S0990XA Unspecified injury of head, initial encounter: Secondary | ICD-10-CM

## 2016-12-30 DIAGNOSIS — I10 Essential (primary) hypertension: Secondary | ICD-10-CM | POA: Insufficient documentation

## 2016-12-30 DIAGNOSIS — I2510 Atherosclerotic heart disease of native coronary artery without angina pectoris: Secondary | ICD-10-CM | POA: Insufficient documentation

## 2016-12-30 DIAGNOSIS — W109XXA Fall (on) (from) unspecified stairs and steps, initial encounter: Secondary | ICD-10-CM | POA: Diagnosis not present

## 2016-12-30 DIAGNOSIS — Y999 Unspecified external cause status: Secondary | ICD-10-CM | POA: Diagnosis not present

## 2016-12-30 DIAGNOSIS — T07XXXA Unspecified multiple injuries, initial encounter: Secondary | ICD-10-CM

## 2016-12-30 DIAGNOSIS — E119 Type 2 diabetes mellitus without complications: Secondary | ICD-10-CM | POA: Insufficient documentation

## 2016-12-30 DIAGNOSIS — F17210 Nicotine dependence, cigarettes, uncomplicated: Secondary | ICD-10-CM | POA: Diagnosis not present

## 2016-12-30 DIAGNOSIS — S161XXA Strain of muscle, fascia and tendon at neck level, initial encounter: Secondary | ICD-10-CM | POA: Diagnosis not present

## 2016-12-30 DIAGNOSIS — Y929 Unspecified place or not applicable: Secondary | ICD-10-CM | POA: Insufficient documentation

## 2016-12-30 DIAGNOSIS — Z79899 Other long term (current) drug therapy: Secondary | ICD-10-CM | POA: Diagnosis not present

## 2016-12-30 DIAGNOSIS — Y9389 Activity, other specified: Secondary | ICD-10-CM | POA: Diagnosis not present

## 2016-12-30 DIAGNOSIS — S199XXA Unspecified injury of neck, initial encounter: Secondary | ICD-10-CM | POA: Diagnosis present

## 2016-12-30 NOTE — Discharge Instructions (Signed)
You were evaluated after fall, and your exam and evaluation are reassuring in the emergency department today. As we discussed, I suspect you to be very sore the next couple of days. Return to the emergency room immediately for any worsening or severe pain, trouble breathing, weakness, numbness, confusion or altered mental status, or any other symptoms concerning to you.

## 2016-12-30 NOTE — ED Notes (Signed)
Pt reports neck pian and headache after falling down steps today while moving furniture this am.    No loc. No vomiting. Pt states dresser fell onto chest .  No chest pain.  Pt states he struck head on wooden stairs.  No abrasion/lacs.  Pt alert    c-collar in place.

## 2016-12-30 NOTE — ED Triage Notes (Signed)
Pt arrives to ER via POV c/o fall that occurred at 10AM today. Pt reports he was going down wooden stairs with dresser and missed last 4 steps, falling down on his back and hitting his head on wood floor. Denies LOC. Pt states the furniture then fell down onto his chest. Pt only reports neck pain and dizziness at this time. Pt denies chest pain from fall. Pt went to work after fall and decided to come to ER after dizziness began.

## 2016-12-30 NOTE — ED Notes (Signed)
c-collar applied in triage

## 2016-12-30 NOTE — ED Provider Notes (Signed)
[facility] Emergency Department Provider Note ____________________________________________   I have reviewed the triage vital signs and the triage nursing note.  HISTORY  Chief Complaint Fall; Neck Pain; and Back Pain   Historian Patient and family members  HPI Keith [name] is a 46 y.o. male who is here for evaluation after a fall he sustained this morning while moving furniture. He was going backwards down the stairs and was about halfway down the flight of stairs when he fell on his back about 4 stairs down landing on his back. He is not sure. His head, but thinks that he did right at the base of his neck. No loss of consciousness. The dresser came down and struck him in the chest. Denies any significant chest pain, but states that he is a little sore. He initially laid there for a few minutes and then got up and moved the dresser out onto the truck and went to work. When he decided to come over for evaluation he started feeling a little bit lightheaded or dizzy now gone. Denies headache. Denies confusion or altered mental status. Denies weakness or numbness. Denies extremity injuries. He states the sore at the base of his right neck over the trapezius.    Past Medical History:  Diagnosis Date  . Coronary artery disease   . Diabetes mellitus   . Frequent headaches   . Hypertension     Patient Active Problem List   Diagnosis Date Noted  . Migraine aura, persistent, with status migrainosus   . Hypertensive urgency 01/23/2015  . Slurred speech 01/23/2015  . Headache 01/23/2015  . DM (diabetes mellitus) with complications (HCC) 01/23/2015  . TIA (transient ischemic attack) 01/23/2015    History reviewed. No pertinent surgical history.  Prior to Admission medications   Medication Sig Start Date End Date Taking? Authorizing Provider  dexlansoprazole (DEXILANT) 60 MG capsule Take 1 capsule (60 mg total) by mouth daily. 12/22/16   Donita Brooks, MD   losartan-hydrochlorothiazide (HYZAAR) 100-12.5 MG tablet Take 1 tablet by mouth daily. 12/16/16   Donita Brooks, MD  pantoprazole (PROTONIX) 40 MG tablet Take 1 tablet (40 mg total) by mouth daily. 12/22/16   Donita Brooks, MD    No Known Allergies  Family History  Problem Relation Age of Onset  . Hypertension Mother   . Hypertension Father     Social History Social History  Substance Use Topics  . Smoking status: Current Every Day Smoker    Packs/day: 0.50    Types: Cigarettes  . Smokeless tobacco: Never Used  . Alcohol use No    Review of Systems  Constitutional: Negative for fever. Eyes: Negative for visual changes. ENT: Negative for sore throat. Cardiovascular: Negative for chest pain, some soreness and mild erythema without any pain with breathing.[name] Kitchen Respiratory: Negative for shortness of breath. Gastrointestinal: Negative for abdominal pain, vomiting and diarrhea. Genitourinary: Negative for dysuria. Musculoskeletal: Negative for back pain. Skin: Negative for rash. Neurological: Negative for headache. 10 point Review of Systems otherwise negative ____________________________________________   PHYSICAL EXAM:  VITAL SIGNS: ED Triage Vitals  Enc Vitals Group     BP 12/30/16 1625 (!) 168/85     Pulse Rate 12/30/16 1625 73     Resp 12/30/16 1625 18     Temp 12/30/16 1625 98.4 F (36.9 C)     Temp Source 12/30/16 1625 Oral     SpO2 12/30/16 1625 100 %     Weight 12/30/16 1625 167 lb (  75.8 kg)     Height --      Head Circumference --      Peak Flow --      Pain Score 12/30/16 1626 2     Pain Loc --      Pain Edu? --      Excl. in GC? --      Constitutional: Alert and oriented. Well appearing and in no distress. HEENT   Head: Normocephalic and atraumatic.      Eyes: Conjunctivae are normal. PERRL. Normal extraocular movements.      Ears:         Nose: No congestion/rhinnorhea.   Mouth/Throat: Mucous membranes are moist.   Neck: No  stridor. Cardiovascular/Chest: Normal rate, regular rhythm.  No murmurs, rubs, or gallops.  Very mild erythema of the skin over the anterior chest without any significant pain to AP or lateral compression. Respiratory: Normal respiratory effort without tachypnea nor retractions. Breath sounds are clear and equal bilaterally. No wheezes/rales/rhonchi. Gastrointestinal: Soft. No distention, no guarding, no rebound. Nontender.    Genitourinary/rectal:Deferred Musculoskeletal: Mild tenderness at the base and the top of the neck without any thoracic or lumbar midline tenderness to palpation. Nontender with normal range of motion in all extremities. No joint effusions.  No lower extremity tenderness.  No edema. Neurologic:  Normal speech and language. No gross or focal neurologic deficits are appreciated. Skin:  Skin is warm, dry and intact. No rash noted. Psychiatric: Mood and affect are normal. Speech and behavior are normal. Patient exhibits appropriate insight and judgment.   ____________________________________________  LABS (pertinent positives/negatives)  Labs Reviewed - No data to display  ____________________________________________    EKG I, Governor Rooks, MD, the attending physician have personally viewed and interpreted all ECGs.  None ____________________________________________  RADIOLOGY All Xrays were viewed by me. Imaging interpreted by Radiologist.  CT head without contrast, CT cervical spine without contrast:  IMPRESSION: 1. No acute intracranial abnormality or displaced calvarial fracture. 2. Stable left mastoid opacification. 3. No acute fracture or dislocation of the cervical spine. __________________________________________  PROCEDURES  Procedure(s) performed: None  Critical Care performed: None  ____________________________________________   ED COURSE / ASSESSMENT AND PLAN  Pertinent labs & imaging results that were available during my care of the  patient were reviewed by me and considered in my medical decision making (see chart for details).  [name] presents after a fall onto his back and dresser to his chest earlier today. He is not having any significant chest pain, his main complaint was neck discomfort and soreness, as well as some dizziness on the way in.  CT of head and neck are reassuring for no dramatic finding. Clinically he does not appear to be having concussion, but I did discuss this with him in terms of the one episode of dizziness. I'm not suspicious of intrathoracic trauma, mild erythema and mild soreness without any significant chest pain.    CONSULTATIONS:   None   Patient / Family / Caregiver informed of clinical course, medical decision-making process, and agree with plan.   I discussed return precautions, follow-up instructions, and discharge instructions with patient and/or family.   ___________________________________________   FINAL CLINICAL IMPRESSION(S) / ED DIAGNOSES   Final diagnoses:  Injury of head, initial encounter  Neck strain, initial encounter  Multiple contusions              Note: This dictation was prepared with Dragon dictation. Any transcriptional errors that result from this process are unintentional  Governor Rooks, MD 12/30/16 1946

## 2017-01-18 ENCOUNTER — Ambulatory Visit (INDEPENDENT_AMBULATORY_CARE_PROVIDER_SITE_OTHER): Payer: Commercial Indemnity | Admitting: Gastroenterology

## 2017-01-18 ENCOUNTER — Encounter: Payer: Self-pay | Admitting: Gastroenterology

## 2017-01-18 ENCOUNTER — Other Ambulatory Visit: Payer: Self-pay

## 2017-01-18 VITALS — BP 164/88 | Wt 175.0 lb

## 2017-01-18 DIAGNOSIS — K5900 Constipation, unspecified: Secondary | ICD-10-CM | POA: Diagnosis not present

## 2017-01-18 DIAGNOSIS — Z791 Long term (current) use of non-steroidal anti-inflammatories (NSAID): Secondary | ICD-10-CM | POA: Diagnosis not present

## 2017-01-18 DIAGNOSIS — K219 Gastro-esophageal reflux disease without esophagitis: Secondary | ICD-10-CM | POA: Diagnosis not present

## 2017-01-18 DIAGNOSIS — R1013 Epigastric pain: Secondary | ICD-10-CM

## 2017-01-18 DIAGNOSIS — Z1211 Encounter for screening for malignant neoplasm of colon: Secondary | ICD-10-CM

## 2017-01-18 DIAGNOSIS — R634 Abnormal weight loss: Secondary | ICD-10-CM

## 2017-01-18 MED ORDER — NA SULFATE-K SULFATE-MG SULF 17.5-3.13-1.6 GM/177ML PO SOLN
1.0000 | Freq: Once | ORAL | 1 refills | Status: AC
Start: 2017-01-18 — End: 2017-01-18

## 2017-01-18 NOTE — Progress Notes (Signed)
Gastroenterology Consultation  Referring Provider:     Donita Brooks, MD Primary Care Physician:  Leo Grosser, MD Primary Gastroenterologist:  Dr. [name]  Reason for Consultation:     Weight loss         HPI:   Keith [name] is a 46 y.o. y/o male referred for consultation & management  by Dr. Lynnea Ferrier TOM, MD.    He has been referred for unexplained weight loss, early satiety ,constipation,abdominal pain. Last Hba1c 6.1, Hb 16.6.  He says he has had abdominal pain , on and off, He says with protonix has no symptoms. Prior to going onto protonix -had bad gas, burping , he would induce throwing up , if he didn't do that would throw up anyway.  He would have have abdominal pain until he "burped", he did have a lot of abdominal distension. Denies use of artificial sugars.   He say she has lost 100 lbs over the last 3 years, was unintentional. He also was depressed after his dad died. He used to " do Bc's heavy, 3-6 per day for a long time", for years and stopped a few months back . He was taking it for migraines. At times he would take 10 at a time.   Rare alcohol. He smokes a pack a day since the age of 35. No family history of cancer.  Has a bowel movement every 3-5 days , this is new since the year. Prior to that wouldn't go for upto 10 days. No change in shape of stool, no blood in stool.   Since he started the protonix - feels better, no nausea or abdominal pain but when he stops the protonix the symptoms return and pretty severe.   Apetite has returned since he started the protonix.  He feels food sits in his stomach for a long time. When he gets that sensation he makes himself throw up . This is much better after starting the protonix.  In 01/2016 was 167 lbs and today is 175 lbs.   BP (!) 164/88   Wt 175 lb (79.4 kg)   BMI 24.41 kg/m    Past Medical History:  Diagnosis Date  . Coronary artery disease   . Diabetes mellitus   . Frequent headaches   .  Hypertension     No past surgical history on file.  Prior to Admission medications   Medication Sig Start Date End Date Taking? Authorizing Provider  dexlansoprazole (DEXILANT) 60 MG capsule Take 1 capsule (60 mg total) by mouth daily. 12/22/16  Yes Donita Brooks, MD  losartan-hydrochlorothiazide (HYZAAR) 100-12.5 MG tablet Take 1 tablet by mouth daily. 12/16/16  Yes Donita Brooks, MD  pantoprazole (PROTONIX) 40 MG tablet Take 1 tablet (40 mg total) by mouth daily. 12/22/16  Yes Donita Brooks, MD    Family History  Problem Relation Age of Onset  . Hypertension Mother   . Hypertension Father      Social History  Substance Use Topics  . Smoking status: Current Every Day Smoker    Packs/day: 0.50    Types: Cigarettes  . Smokeless tobacco: Never Used  . Alcohol use No    Allergies as of 01/18/2017  . (No Known Allergies)    Review of Systems:    All systems reviewed and negative except where noted in HPI.   Physical Exam:  BP (!) 164/88   Wt 175 lb (79.4 kg)   BMI 24.41 kg/m  No LMP for  male patient. Psych:  Alert and cooperative. Normal mood and affect. General:   Alert,  Well-developed, well-nourished, pleasant and cooperative in NAD Head:  Normocephalic and atraumatic. Eyes:  Sclera clear, no icterus.   Conjunctiva pink. Ears:  Normal auditory acuity. Nose:  No deformity, discharge, or lesions. Mouth:  No deformity or lesions,oropharynx pink & moist. Neck:  Supple; no masses or thyromegaly. Lungs:  Respirations even and unlabored.  Clear throughout to auscultation.   No wheezes, crackles, or rhonchi. No acute distress. Heart:  Regular rate and rhythm; no murmurs, clicks, rubs, or gallops. Abdomen:  Normal bowel sounds.  No bruits.  Soft, non-tender and non-distended without masses, hepatosplenomegaly or hernias noted.  No guarding or rebound tenderness.    Msk:  Symmetrical without gross deformities. Good, equal movement & strength bilaterally. Pulses:  Normal  pulses noted. Extremities:  No clubbing or edema.  No cyanosis. Neurologic:  Alert and oriented x3;  grossly normal neurologically. Psych:  Alert and cooperative. Normal mood and affect.  Imaging Studies: Ct Head Wo Contrast  Result Date: 12/30/2016 CLINICAL DATA:  46 y/o  M; status post fall with head injury. EXAM: CT HEAD WITHOUT CONTRAST CT CERVICAL SPINE WITHOUT CONTRAST TECHNIQUE: Multidetector CT imaging of the head and cervical spine was performed following the standard protocol without intravenous contrast. Multiplanar CT image reconstructions of the cervical spine were also generated. COMPARISON:  01/23/2015 CT head. FINDINGS: CT HEAD FINDINGS Brain: No evidence of acute infarction, hemorrhage, hydrocephalus, extra-axial collection or mass lesion/mass effect. Vascular: No hyperdense vessel or unexpected calcification. Skull: Normal. Negative for fracture or focal lesion. Sinuses/Orbits: Stable left mastoid opacification. Normally aerated paranasal sinuses. Orbits are unremarkable. Other: None. CT CERVICAL SPINE FINDINGS Alignment: Normal. Skull base and vertebrae: No acute fracture. No primary bone lesion or focal pathologic process. Soft tissues and spinal canal: No prevertebral fluid or swelling. No visible canal hematoma. Disc levels: Mild cervical discogenic degenerative changes with small disc protrusions and a prominent disc bulge at the C6-7 level eccentric to the left. Upper chest: Negative. Other: 7 mm nodule in right lobe of thyroid. IMPRESSION: 1. No acute intracranial abnormality or displaced calvarial fracture. 2. Stable left mastoid opacification. 3. No acute fracture or dislocation of the cervical spine. Electronically Signed   By: Mitzi Hansen M.D.   On: 12/30/2016 16:56   Ct Cervical Spine Wo Contrast  Result Date: 12/30/2016 CLINICAL DATA:  46 y/o  M; status post fall with head injury. EXAM: CT HEAD WITHOUT CONTRAST CT CERVICAL SPINE WITHOUT CONTRAST TECHNIQUE:  Multidetector CT imaging of the head and cervical spine was performed following the standard protocol without intravenous contrast. Multiplanar CT image reconstructions of the cervical spine were also generated. COMPARISON:  01/23/2015 CT head. FINDINGS: CT HEAD FINDINGS Brain: No evidence of acute infarction, hemorrhage, hydrocephalus, extra-axial collection or mass lesion/mass effect. Vascular: No hyperdense vessel or unexpected calcification. Skull: Normal. Negative for fracture or focal lesion. Sinuses/Orbits: Stable left mastoid opacification. Normally aerated paranasal sinuses. Orbits are unremarkable. Other: None. CT CERVICAL SPINE FINDINGS Alignment: Normal. Skull base and vertebrae: No acute fracture. No primary bone lesion or focal pathologic process. Soft tissues and spinal canal: No prevertebral fluid or swelling. No visible canal hematoma. Disc levels: Mild cervical discogenic degenerative changes with small disc protrusions and a prominent disc bulge at the C6-7 level eccentric to the left. Upper chest: Negative. Other: 7 mm nodule in right lobe of thyroid. IMPRESSION: 1. No acute intracranial abnormality or displaced calvarial fracture. 2. Stable left  mastoid opacification. 3. No acute fracture or dislocation of the cervical spine. Electronically Signed   By: Mitzi Hansen M.D.   On: 12/30/2016 16:56    Assessment and Plan:   COLYN MIRON is a 46 y.o. y/o male has been referred for abdominal pain,nausea,vomiting ,weight loss. All symptoms have resolved after commencing PPI. He also has change in bowel habits, long term use of NSAID's which he has stopped.   My impression is that he has underlying diabetic gastroparesis which contributed to acid reflux , in addition to which he may have had peptic ulcer disease from long term NSAID use.  Plan:  1. EGD/Colonoscopy ASAP 2. Gastric emptying study  3. TSH/Celiac serology  4. Continue PPI. At next visit will address GERD in  detail.   His weight has been stable but if there is a further drop will obtain Ct chest/thorax and abdomen   Follow up in 6-8 weeks  Dr [name] MD

## 2017-01-19 ENCOUNTER — Encounter: Payer: Self-pay | Admitting: Gastroenterology

## 2017-01-20 ENCOUNTER — Other Ambulatory Visit
Admission: RE | Admit: 2017-01-20 | Discharge: 2017-01-20 | Disposition: A | Discharge status: Home / Self Care | Payer: Managed Care, Other (non HMO) | Source: Ambulatory Visit | Attending: Gastroenterology | Admitting: Gastroenterology

## 2017-01-20 DIAGNOSIS — K219 Gastro-esophageal reflux disease without esophagitis: Secondary | ICD-10-CM | POA: Insufficient documentation

## 2017-01-20 LAB — TSH: TSH: 2.774 u[IU]/mL (ref 0.350–4.500)

## 2017-01-21 LAB — CELIAC DISEASE PANEL
Endomysial Ab, IgA: NEGATIVE
IgA: 213 mg/dL (ref 90–386)

## 2017-02-01 ENCOUNTER — Encounter: Admission: RE | Admit: 2017-02-01 | Payer: Managed Care, Other (non HMO) | Source: Ambulatory Visit

## 2017-02-08 ENCOUNTER — Ambulatory Visit
Admission: RE | Admit: 2017-02-08 | Discharge: 2017-02-08 | Disposition: A | Discharge status: Home / Self Care | Payer: Managed Care, Other (non HMO) | Source: Ambulatory Visit | Attending: Gastroenterology | Admitting: Gastroenterology

## 2017-02-08 DIAGNOSIS — K219 Gastro-esophageal reflux disease without esophagitis: Secondary | ICD-10-CM | POA: Diagnosis present

## 2017-02-08 MED ORDER — TECHNETIUM TC 99M SULFUR COLLOID: 2.0000 | Freq: Once | INTRAVENOUS | Status: DC | PRN

## 2017-02-08 MED ORDER — TECHNETIUM TC 99M SULFUR COLLOID
2.0000 | Freq: Once | INTRAVENOUS | Status: AC | PRN
Start: 2017-02-08 — End: 2017-02-08
  Administered 2017-02-08: 2.505 via ORAL

## 2017-02-14 ENCOUNTER — Encounter: Payer: Self-pay | Admitting: Gastroenterology

## 2017-02-15 ENCOUNTER — Telehealth: Payer: Self-pay

## 2017-02-15 ENCOUNTER — Other Ambulatory Visit: Payer: Self-pay

## 2017-02-15 DIAGNOSIS — Z1211 Encounter for screening for malignant neoplasm of colon: Secondary | ICD-10-CM

## 2017-02-15 MED ORDER — PEG 3350-KCL-NA BICARB-NACL 420 G PO SOLR
4000.0000 mL | Freq: Once | ORAL | 0 refills | Status: AC
Start: 2017-02-15 — End: 2017-02-15

## 2017-02-15 NOTE — Telephone Encounter (Signed)
Resent documentation to mychart.

## 2017-02-15 NOTE — Telephone Encounter (Signed)
Spoke with Keith [name] about prep and resent MyChart letter

## 2017-02-19 ENCOUNTER — Ambulatory Visit: Payer: Managed Care, Other (non HMO) | Admitting: Anesthesiology

## 2017-02-19 ENCOUNTER — Encounter: Payer: Self-pay | Admitting: Anesthesiology

## 2017-02-19 ENCOUNTER — Encounter
Admission: RE | Disposition: A | Discharge status: Home / Self Care | Payer: Self-pay | Source: Ambulatory Visit | Attending: Gastroenterology

## 2017-02-19 ENCOUNTER — Ambulatory Visit
Admission: RE | Admit: 2017-02-19 | Discharge: 2017-02-19 | Disposition: A | Discharge status: Home / Self Care | Payer: Managed Care, Other (non HMO) | Source: Ambulatory Visit | Attending: Gastroenterology | Admitting: Gastroenterology

## 2017-02-19 DIAGNOSIS — K2950 Unspecified chronic gastritis without bleeding: Secondary | ICD-10-CM | POA: Insufficient documentation

## 2017-02-19 DIAGNOSIS — R51 Headache: Secondary | ICD-10-CM | POA: Insufficient documentation

## 2017-02-19 DIAGNOSIS — I2510 Atherosclerotic heart disease of native coronary artery without angina pectoris: Secondary | ICD-10-CM | POA: Insufficient documentation

## 2017-02-19 DIAGNOSIS — Z79899 Other long term (current) drug therapy: Secondary | ICD-10-CM | POA: Diagnosis not present

## 2017-02-19 DIAGNOSIS — R634 Abnormal weight loss: Secondary | ICD-10-CM

## 2017-02-19 DIAGNOSIS — Z8249 Family history of ischemic heart disease and other diseases of the circulatory system: Secondary | ICD-10-CM | POA: Diagnosis not present

## 2017-02-19 DIAGNOSIS — I10 Essential (primary) hypertension: Secondary | ICD-10-CM | POA: Diagnosis not present

## 2017-02-19 DIAGNOSIS — R1013 Epigastric pain: Secondary | ICD-10-CM | POA: Diagnosis not present

## 2017-02-19 DIAGNOSIS — K449 Diaphragmatic hernia without obstruction or gangrene: Secondary | ICD-10-CM | POA: Diagnosis not present

## 2017-02-19 DIAGNOSIS — Z1211 Encounter for screening for malignant neoplasm of colon: Secondary | ICD-10-CM

## 2017-02-19 DIAGNOSIS — F17210 Nicotine dependence, cigarettes, uncomplicated: Secondary | ICD-10-CM | POA: Diagnosis not present

## 2017-02-19 DIAGNOSIS — K210 Gastro-esophageal reflux disease with esophagitis: Secondary | ICD-10-CM | POA: Diagnosis not present

## 2017-02-19 HISTORY — PX: COLONOSCOPY WITH PROPOFOL: SHX5780

## 2017-02-19 HISTORY — PX: ESOPHAGOGASTRODUODENOSCOPY (EGD) WITH PROPOFOL: SHX5813

## 2017-02-19 SURGERY — ESOPHAGOGASTRODUODENOSCOPY (EGD) WITH PROPOFOL
Anesthesia: General

## 2017-02-19 MED ORDER — GLYCOPYRROLATE 0.2 MG/ML IJ SOLN
INTRAMUSCULAR | Status: DC | PRN
  Administered 2017-02-19: 0.2 mg via INTRAVENOUS

## 2017-02-19 MED ORDER — PROPOFOL 500 MG/50ML IV EMUL
INTRAVENOUS | Status: AC
  Filled 2017-02-19: qty 50

## 2017-02-19 MED ORDER — GLYCOPYRROLATE 0.2 MG/ML IJ SOLN
INTRAMUSCULAR | Status: AC
  Filled 2017-02-19: qty 1

## 2017-02-19 MED ORDER — SODIUM CHLORIDE 0.9 % IV SOLN
INTRAVENOUS | Status: DC
  Administered 2017-02-19: 1000 mL via INTRAVENOUS

## 2017-02-19 MED ORDER — PROPOFOL 500 MG/50ML IV EMUL
INTRAVENOUS | Status: DC | PRN
  Administered 2017-02-19: 150 ug/kg/min via INTRAVENOUS

## 2017-02-19 MED ORDER — LIDOCAINE HCL (PF) 2 % IJ SOLN
INTRAMUSCULAR | Status: AC
  Filled 2017-02-19: qty 2

## 2017-02-19 MED ORDER — LIDOCAINE HCL (PF) 1 % IJ SOLN
2.0000 mL | Freq: Once | INTRAMUSCULAR | Status: AC
  Administered 2017-02-19: 0.3 mL via INTRADERMAL

## 2017-02-19 MED ORDER — PROPOFOL 10 MG/ML IV BOLUS
INTRAVENOUS | Status: DC | PRN
  Administered 2017-02-19: 30 mg via INTRAVENOUS
  Administered 2017-02-19: 90 mg via INTRAVENOUS

## 2017-02-19 NOTE — Anesthesia Post-op Follow-up Note (Signed)
Anesthesia QCDR form completed.        

## 2017-02-19 NOTE — Op Note (Signed)
[facility] Gastroenterology Patient Name: Keith [name] Procedure Date: 02/19/2017 7:38 AM MRN: [id number] Account #: [id number] Date of Birth: [birth year] Admit Type: Outpatient Age: 46 Room: [facility] ENDO ROOM 4 Gender: Male Note Status: Finalized Procedure:            Colonoscopy Indications:          Weight loss Providers:            [name] MD, MD Referring MD:         Priscille Heidelberg. Pickard (Referring MD) Medicines:            Monitored Anesthesia Care Complications:        No immediate complications. Procedure:            Pre-Anesthesia Assessment:                       - ASA Grade Assessment: II - A patient with mild                        systemic disease.                       After obtaining informed consent, the colonoscope was                        passed under direct vision. Throughout the procedure,                        the patient's blood pressure, pulse, and oxygen                        saturations were monitored continuously. The                        Colonoscope was introduced through the anus and                        advanced to the the cecum, identified by the                        appendiceal orifice, IC valve and transillumination.                        The colonoscopy was performed with ease. The patient                        tolerated the procedure well. The quality of the bowel                        preparation was poor. Findings:      The perianal and digital rectal examinations were normal.      A large amount of semi-solid stool was found in the entire colon,       precluding visualization.      The exam was otherwise without abnormality on direct and retroflexion       views. Impression:           - Preparation of the colon was poor.                       - Stool in the entire examined colon.                       -  The examination was otherwise normal on direct and                        retroflexion views.       - No specimens collected. Recommendation:       - Discharge patient to home (with escort).                       - Resume previous diet.                       - Continue present medications.                       - Repeat colonoscopy in 2 weeks because the bowel                        preparation was suboptimal. Procedure Code(s):    --- Professional ---                       [phone], Colonoscopy, flexible; diagnostic, including                        collection of specimen(s) by brushing or washing, when                        performed (separate procedure) Diagnosis Code(s):    --- Professional ---                       R63.4, Abnormal weight loss CPT copyright 2016 American Medical Association. All rights reserved. The codes documented in this report are preliminary and upon coder review may  be revised to meet current compliance requirements. [name], MD [name] MD, MD 02/19/2017 8:06:25 AM This report has been signed electronically. Number of Addenda: 0 Note Initiated On: 02/19/2017 7:38 AM Scope Withdrawal Time: 0 hours 4 minutes 25 seconds  Total Procedure Duration: 0 hours 10 minutes 6 seconds       [facility]

## 2017-02-19 NOTE — H&P (Signed)
  [name] MD [address], [address] [place], [place] [address] Phone: [phone] Fax : [phone]  Primary Care Physician:  Leo Grosser, MD Primary Gastroenterologist:  Dr. [name]   Pre-Procedure History & Physical: HPI:  Keith [name] is a 46 y.o. male is here for an endoscopy and colonoscopy.   Past Medical History:  Diagnosis Date  . Coronary artery disease   . Diabetes mellitus   . Frequent headaches   . Hypertension     No past surgical history on file.  Prior to Admission medications   Medication Sig Start Date End Date Taking? Authorizing Provider  dexlansoprazole (DEXILANT) 60 MG capsule Take 1 capsule (60 mg total) by mouth daily. 12/22/16   Donita Brooks, MD  losartan-hydrochlorothiazide (HYZAAR) 100-12.5 MG tablet Take 1 tablet by mouth daily. 12/16/16   Donita Brooks, MD  pantoprazole (PROTONIX) 40 MG tablet Take 1 tablet (40 mg total) by mouth daily. 12/22/16   Donita Brooks, MD    Allergies as of 01/18/2017 - Review Complete 01/18/2017  Allergen Reaction Noted  . Other Other (See Comments) 01/18/2017    Family History  Problem Relation Age of Onset  . Hypertension Mother   . Hypertension Father     Social History   Social History  . Marital status: Married    Spouse name: N/A  . Number of children: N/A  . Years of education: N/A   Occupational History  . Not on file.   Social History Main Topics  . Smoking status: Current Every Day Smoker    Packs/day: 0.50    Types: Cigarettes  . Smokeless tobacco: Never Used  . Alcohol use No  . Drug use: No  . Sexual activity: Not on file   Other Topics Concern  . Not on file   Social History Narrative  . No narrative on file    Review of Systems: See HPI, otherwise negative ROS  Physical Exam: BP (!) 174/94   Pulse 77   Temp (!) 96.7 F (35.9 C)   Resp 17   Ht 5\' 7"  (1.702 m)   Wt 174 lb (78.9 kg)   SpO2 100%   BMI 27.25 kg/m  General:   Alert,  pleasant and  cooperative in NAD Head:  Normocephalic and atraumatic. Neck:  Supple; no masses or thyromegaly. Lungs:  Clear throughout to auscultation.    Heart:  Regular rate and rhythm. Abdomen:  Soft, nontender and nondistended. Normal bowel sounds, without guarding, and without rebound.   Neurologic:  Alert and  oriented x4;  grossly normal neurologically.  Impression/Plan: Keith [name] is here for an endoscopy and colonoscopy to be performed for dyspepsia and unexplained weight loss   Risks, benefits, limitations, and alternatives regarding  endoscopy and colonoscopy have been reviewed with the patient.  Questions have been answered.  All parties agreeable.   [name], MD  02/19/2017, 7:25 AM

## 2017-02-19 NOTE — Anesthesia Postprocedure Evaluation (Signed)
Anesthesia Post Note  Patient: Keith [name]  Procedure(s) Performed: Procedure(s) (LRB): ESOPHAGOGASTRODUODENOSCOPY (EGD) WITH PROPOFOL (N/A) COLONOSCOPY WITH PROPOFOL (N/A)  Patient location during evaluation: Endoscopy Anesthesia Type: General Level of consciousness: awake and alert Pain management: pain level controlled Vital Signs Assessment: post-procedure vital signs reviewed and stable Respiratory status: spontaneous breathing, nonlabored ventilation, respiratory function stable and patient connected to nasal cannula oxygen Cardiovascular status: blood pressure returned to baseline and stable Postop Assessment: no signs of nausea or vomiting Anesthetic complications: no     Last Vitals:  Vitals:   02/19/17 0838 02/19/17 0848  BP: (!) 144/99 (!) 154/99  Pulse: 63 64  Resp: 12 (!) 21  Temp:      Last Pain:  Vitals:   02/19/17 0808  TempSrc: Tympanic                 [name]

## 2017-02-19 NOTE — Anesthesia Preprocedure Evaluation (Signed)
Anesthesia Evaluation  Patient identified by MRN, date of birth, ID band Patient awake    Reviewed: Allergy & Precautions, H&P , NPO status , Patient's Chart, lab work & pertinent test results, reviewed documented beta blocker date and time   History of Anesthesia Complications Negative for: history of anesthetic complications  Airway Mallampati: III  TM Distance: >3 FB Neck ROM: full    Dental  (+) Missing, Teeth Intact, Dental Advidsory Given   Pulmonary neg shortness of breath, neg sleep apnea, neg COPD, neg recent URI, Current Smoker,           Cardiovascular Exercise Tolerance: Good hypertension, (-) angina+ CAD  (-) Past MI, (-) Cardiac Stents and (-) CABG (-) dysrhythmias (-) Valvular Problems/Murmurs     Neuro/Psych negative neurological ROS  negative psych ROS   GI/Hepatic Neg liver ROS, GERD  ,  Endo/Other  diabetes (borderline)  Renal/GU negative Renal ROS  negative genitourinary   Musculoskeletal   Abdominal   Peds  Hematology negative hematology ROS (+)   Anesthesia Other Findings Past Medical History: No date: Coronary artery disease No date: Diabetes mellitus No date: Frequent headaches No date: Hypertension   Reproductive/Obstetrics negative OB ROS                             Anesthesia Physical Anesthesia Plan  ASA: II  Anesthesia Plan: General   Post-op Pain Management:    Induction:   Airway Management Planned:   Additional Equipment:   Intra-op Plan:   Post-operative Plan:   Informed Consent: I have reviewed the patients History and Physical, chart, labs and discussed the procedure including the risks, benefits and alternatives for the proposed anesthesia with the patient or authorized representative who has indicated his/her understanding and acceptance.   Dental Advisory Given  Plan Discussed with: Anesthesiologist, CRNA and Surgeon  Anesthesia  Plan Comments:         Anesthesia Quick Evaluation

## 2017-02-19 NOTE — Transfer of Care (Signed)
Immediate Anesthesia Transfer of Care Note  Patient: Keith [name]  Procedure(s) Performed: Procedure(s): ESOPHAGOGASTRODUODENOSCOPY (EGD) WITH PROPOFOL (N/A) COLONOSCOPY WITH PROPOFOL (N/A)  Patient Location: Endoscopy Unit  Anesthesia Type:General  Level of Consciousness: sedated  Airway & Oxygen Therapy: Patient Spontanous Breathing and Patient connected to nasal cannula oxygen  Post-op Assessment: Report given to RN and Post -op Vital signs reviewed and stable  Post vital signs: Reviewed and stable  Last Vitals:  Vitals:   02/19/17 0807 02/19/17 0808  BP:  118/79  Pulse:  67  Resp:  17  Temp: (!) 36.1 C     Last Pain:  Vitals:   02/19/17 0808  TempSrc: Tympanic         Complications: No apparent anesthesia complications

## 2017-02-19 NOTE — Op Note (Addendum)
[facility] Gastroenterology Patient Name: Keith [name] Procedure Date: 02/19/2017 7:39 AM MRN: [id number] Account #: [id number] Date of Birth: 1971-06-16 Admit Type: Outpatient Age: 46 Room: [facility] ENDO ROOM 4 Gender: Male Note Status: Finalized Procedure:            Upper GI endoscopy Indications:          Dyspepsia Providers:            [name] MD, MD Referring MD:         Priscille Heidelberg. Pickard (Referring MD) Medicines:            Monitored Anesthesia Care Complications:        No immediate complications. Procedure:            Pre-Anesthesia Assessment:                       - Prior to the procedure, a History and Physical was                        performed, and patient medications, allergies and                        sensitivities were reviewed. The patient's tolerance of                        previous anesthesia was reviewed.                       - The risks and benefits of the procedure and the                        sedation options and risks were discussed with the                        patient. All questions were answered and informed                        consent was obtained.                       - ASA Grade Assessment: II - A patient with mild                        systemic disease.                       After obtaining informed consent, the endoscope was                        passed under direct vision. Throughout the procedure,                        the patient's blood pressure, pulse, and oxygen                        saturations were monitored continuously. The Endoscope                        was introduced through the mouth, and advanced to the  third part of duodenum. The upper GI endoscopy was                        accomplished with ease. The patient tolerated the                        procedure well. Findings:      The examined duodenum was normal.      A small amount of food (residue) was found in the  gastric body.      A 4 cm hiatal hernia was present.      LA Grade C (one or more mucosal breaks continuous between tops of 2 or       more mucosal folds, less than 75% circumference) esophagitis with no       bleeding was found 32 to 38 cm from the incisors.      Circumferential salmon-colored mucosa was present from 37 to 38 cm. No       other visible abnormalities were present. The maximum longitudinal       extent of these esophageal mucosal changes was 6 cm in length. Impression:           - Normal examined duodenum.                       - A small amount of food (residue) in the stomach.                       - 4 cm hiatal hernia.                       - LA Grade C reflux esophagitis.                       - Salmon-colored mucosa suspicious for long-segment                        Barrett's esophagus.                       - No specimens collected. Recommendation:       - Perform a colonoscopy today.                       - Recommend GEBT (gastric emptying breath test).                       - Perform an upper endoscopy with biopsies in 8 weeks                        to evaluate the response to therapy.                       - Use Prilosec (omeprazole) 40 mg PO daily for 10 weeks. Procedure Code(s):    --- Professional ---                       [phone], Esophagogastroduodenoscopy, flexible, transoral;                        diagnostic, including collection of specimen(s) by  brushing or washing, when performed (separate procedure) Diagnosis Code(s):    --- Professional ---                       K22.8, Other specified diseases of esophagus                       K21.0, Gastro-esophageal reflux disease with esophagitis                       K44.9, Diaphragmatic hernia without obstruction or                        gangrene                       R10.13, Epigastric pain CPT copyright 2016 American Medical Association. All rights reserved. The codes documented in this  report are preliminary and upon coder review may  be revised to meet current compliance requirements. [name], MD [name] MD, MD 02/19/2017 7:52:03 AM This report has been signed electronically. Number of Addenda: 0 Note Initiated On: 02/19/2017 7:39 AM      [facility]

## 2017-02-22 ENCOUNTER — Encounter: Payer: Self-pay | Admitting: Gastroenterology

## 2017-02-22 LAB — SURGICAL PATHOLOGY

## 2017-02-24 ENCOUNTER — Other Ambulatory Visit: Payer: Self-pay

## 2017-02-24 DIAGNOSIS — K2100 Gastro-esophageal reflux disease with esophagitis, without bleeding: Secondary | ICD-10-CM

## 2017-02-24 DIAGNOSIS — R634 Abnormal weight loss: Secondary | ICD-10-CM

## 2017-02-26 ENCOUNTER — Encounter: Payer: Self-pay | Admitting: Gastroenterology

## 2017-03-01 ENCOUNTER — Telehealth: Payer: Self-pay | Admitting: Gastroenterology

## 2017-03-01 NOTE — Telephone Encounter (Signed)
Advised pt of results per Dr. Tobi Bastos:  1. Biopsies show gastritis- avoid nsaids 2. Perform an upper endoscopy with biopsies in 8 weeks to evaluate the response to therapy and determine if he has barrettes  4.  Use Prilosec (omeprazole) 40 mg PO daily for 10 weeks 5. Colonoscopy with 2 day prep   Patient will not be able to have EGD or colonoscopy until 3rd week of August. Advised callback to schedule.

## 2017-03-01 NOTE — Telephone Encounter (Signed)
Patient called for biopsy results. He won't be able to schedule  procedures until August.

## 2017-03-01 NOTE — Telephone Encounter (Signed)
1. Biopsies show gastritis- avoid nsaids 2. Needs gastric emptying study  3. Perform an upper endoscopy with biopsies in 8 weeks to evaluate the response to therapy and determine if he has barrettes  4.  Use Prilosec (omeprazole) 40 mg PO daily for 10 weeks 5. Colonoscopy with 2 day prep

## 2017-04-23 ENCOUNTER — Encounter: Admission: RE | Payer: Self-pay | Source: Ambulatory Visit

## 2017-04-23 ENCOUNTER — Ambulatory Visit
Admission: RE | Admit: 2017-04-23 | Payer: Managed Care, Other (non HMO) | Source: Ambulatory Visit | Admitting: Gastroenterology

## 2017-04-23 SURGERY — COLONOSCOPY WITH PROPOFOL
Anesthesia: General

## 2017-07-11 ENCOUNTER — Inpatient Hospital Stay (HOSPITAL_COMMUNITY)
Admit: 2017-07-11 | Discharge: 2017-07-11 | Disposition: A | Discharge status: Home / Self Care | Payer: Managed Care, Other (non HMO) | Attending: Cardiovascular Disease | Admitting: Cardiovascular Disease

## 2017-07-11 ENCOUNTER — Emergency Department: Payer: Managed Care, Other (non HMO)

## 2017-07-11 ENCOUNTER — Observation Stay
Admission: EM | Admit: 2017-07-11 | Discharge: 2017-07-12 | Disposition: A | Discharge status: Home / Self Care | Payer: Managed Care, Other (non HMO) | Attending: Internal Medicine | Admitting: Internal Medicine

## 2017-07-11 ENCOUNTER — Encounter: Payer: Self-pay | Admitting: Emergency Medicine

## 2017-07-11 ENCOUNTER — Encounter
Admission: EM | Disposition: A | Discharge status: Home / Self Care | Payer: Self-pay | Source: Home / Self Care | Attending: Emergency Medicine

## 2017-07-11 DIAGNOSIS — F17210 Nicotine dependence, cigarettes, uncomplicated: Secondary | ICD-10-CM | POA: Insufficient documentation

## 2017-07-11 DIAGNOSIS — I447 Left bundle-branch block, unspecified: Secondary | ICD-10-CM | POA: Diagnosis not present

## 2017-07-11 DIAGNOSIS — I110 Hypertensive heart disease with heart failure: Secondary | ICD-10-CM | POA: Insufficient documentation

## 2017-07-11 DIAGNOSIS — R0789 Other chest pain: Secondary | ICD-10-CM

## 2017-07-11 DIAGNOSIS — I2109 ST elevation (STEMI) myocardial infarction involving other coronary artery of anterior wall: Secondary | ICD-10-CM

## 2017-07-11 DIAGNOSIS — I25110 Atherosclerotic heart disease of native coronary artery with unstable angina pectoris: Principal | ICD-10-CM | POA: Insufficient documentation

## 2017-07-11 DIAGNOSIS — R079 Chest pain, unspecified: Secondary | ICD-10-CM

## 2017-07-11 DIAGNOSIS — I350 Nonrheumatic aortic (valve) stenosis: Secondary | ICD-10-CM

## 2017-07-11 DIAGNOSIS — I5023 Acute on chronic systolic (congestive) heart failure: Secondary | ICD-10-CM | POA: Diagnosis not present

## 2017-07-11 DIAGNOSIS — E119 Type 2 diabetes mellitus without complications: Secondary | ICD-10-CM | POA: Diagnosis not present

## 2017-07-11 DIAGNOSIS — I5030 Unspecified diastolic (congestive) heart failure: Secondary | ICD-10-CM | POA: Diagnosis not present

## 2017-07-11 DIAGNOSIS — Z8249 Family history of ischemic heart disease and other diseases of the circulatory system: Secondary | ICD-10-CM | POA: Insufficient documentation

## 2017-07-11 DIAGNOSIS — I200 Unstable angina: Secondary | ICD-10-CM

## 2017-07-11 DIAGNOSIS — G43501 Persistent migraine aura without cerebral infarction, not intractable, with status migrainosus: Secondary | ICD-10-CM | POA: Diagnosis not present

## 2017-07-11 DIAGNOSIS — I5021 Acute systolic (congestive) heart failure: Secondary | ICD-10-CM | POA: Diagnosis present

## 2017-07-11 HISTORY — PX: LEFT HEART CATH AND CORONARY ANGIOGRAPHY: CATH118249

## 2017-07-11 LAB — CBC
HEMATOCRIT: 47.6 % (ref 40.0–52.0)
HEMOGLOBIN: 16.8 g/dL (ref 13.0–18.0)
MCH: 30.7 pg (ref 26.0–34.0)
MCHC: 35.3 g/dL (ref 32.0–36.0)
MCV: 86.8 fL (ref 80.0–100.0)
Platelets: 173 10*3/uL (ref 150–440)
RBC: 5.48 MIL/uL (ref 4.40–5.90)
RDW: 13.8 % (ref 11.5–14.5)
WBC: 7 10*3/uL (ref 3.8–10.6)

## 2017-07-11 LAB — BASIC METABOLIC PANEL
ANION GAP: 7 (ref 5–15)
BUN: 11 mg/dL (ref 6–20)
CHLORIDE: 103 mmol/L (ref 101–111)
CO2: 27 mmol/L (ref 22–32)
Calcium: 8.9 mg/dL (ref 8.9–10.3)
Creatinine, Ser: 0.75 mg/dL (ref 0.61–1.24)
GFR calc Af Amer: >60 mL/min (ref >60)
GLUCOSE: 195 mg/dL — AB (ref 65–99)
POTASSIUM: 3.5 mmol/L (ref 3.5–5.1)
SODIUM: 137 mmol/L (ref 135–145)

## 2017-07-11 LAB — PROTIME-INR
INR: 0.92
PROTHROMBIN TIME: 12.4 s (ref 11.4–15.2)

## 2017-07-11 LAB — GLUCOSE, CAPILLARY
Glucose-Capillary: 173 mg/dL — ABNORMAL HIGH (ref 65–99)
Glucose-Capillary: 146 mg/dL — ABNORMAL HIGH (ref 65–99)
Glucose-Capillary: 172 mg/dL — ABNORMAL HIGH (ref 65–99)
Glucose-Capillary: 201 mg/dL — ABNORMAL HIGH (ref 65–99)

## 2017-07-11 LAB — APTT: APTT: 32 s (ref 24–36)

## 2017-07-11 LAB — TROPONIN I: Troponin I: <0.03 ng/mL (ref <0.03)

## 2017-07-11 SURGERY — LEFT HEART CATH AND CORONARY ANGIOGRAPHY
Anesthesia: Moderate Sedation

## 2017-07-11 MED ORDER — ASPIRIN 81 MG PO CHEW
81.0000 mg | CHEWABLE_TABLET | Freq: Every day | ORAL | Status: DC
  Administered 2017-07-11 – 2017-07-12 (×2): 81 mg via ORAL
  Filled 2017-07-11 (×2): qty 1

## 2017-07-11 MED ORDER — PERFLUTREN LIPID MICROSPHERE
1.0000 mL | INTRAVENOUS | Status: AC | PRN
  Administered 2017-07-11: 6 mL via INTRAVENOUS
  Filled 2017-07-11: qty 10

## 2017-07-11 MED ORDER — HEPARIN SODIUM (PORCINE) 1000 UNIT/ML IJ SOLN
INTRAMUSCULAR | Status: AC
  Filled 2017-07-11: qty 1

## 2017-07-11 MED ORDER — VERAPAMIL HCL 2.5 MG/ML IV SOLN
INTRAVENOUS | Status: DC | PRN
  Administered 2017-07-11: 2.5 mg via INTRA_ARTERIAL

## 2017-07-11 MED ORDER — SODIUM CHLORIDE 0.9 % IV SOLN: 250.0000 mL | INTRAVENOUS | Status: DC | PRN

## 2017-07-11 MED ORDER — HEPARIN SODIUM (PORCINE) 5000 UNIT/ML IJ SOLN
4000.0000 [IU] | Freq: Once | INTRAMUSCULAR | Status: AC
  Administered 2017-07-11: 4000 [IU] via INTRAVENOUS

## 2017-07-11 MED ORDER — FENTANYL CITRATE (PF) 100 MCG/2ML IJ SOLN
INTRAMUSCULAR | Status: DC | PRN
  Administered 2017-07-11: 50 ug via INTRAVENOUS

## 2017-07-11 MED ORDER — SODIUM CHLORIDE 0.9% FLUSH: 3.0000 mL | INTRAVENOUS | Status: DC | PRN

## 2017-07-11 MED ORDER — HEPARIN (PORCINE) IN NACL 100-0.45 UNIT/ML-% IJ SOLN: 16.0000 [IU]/kg/h | Freq: Once | INTRAMUSCULAR | Status: DC

## 2017-07-11 MED ORDER — ASPIRIN 81 MG PO CHEW
324.0000 mg | CHEWABLE_TABLET | Freq: Once | ORAL | Status: AC
  Administered 2017-07-11: 324 mg via ORAL

## 2017-07-11 MED ORDER — HEPARIN BOLUS VIA INFUSION
4000.0000 [IU] | Freq: Once | INTRAVENOUS | Status: DC
  Filled 2017-07-11: qty 4000

## 2017-07-11 MED ORDER — ENOXAPARIN SODIUM 40 MG/0.4ML ~~LOC~~ SOLN
40.0000 mg | SUBCUTANEOUS | Status: DC
  Administered 2017-07-12: 40 mg via SUBCUTANEOUS
  Filled 2017-07-11: qty 0.4

## 2017-07-11 MED ORDER — CARVEDILOL 6.25 MG PO TABS
6.2500 mg | ORAL_TABLET | Freq: Two times a day (BID) | ORAL | Status: DC
  Administered 2017-07-11 – 2017-07-12 (×3): 6.25 mg via ORAL
  Filled 2017-07-11 (×3): qty 1

## 2017-07-11 MED ORDER — HEPARIN (PORCINE) IN NACL 100-0.45 UNIT/ML-% IJ SOLN
1200.0000 [IU]/h | Freq: Once | INTRAMUSCULAR | Status: AC
  Administered 2017-07-11: 1200 [IU]/h via INTRAVENOUS
  Filled 2017-07-11: qty 250

## 2017-07-11 MED ORDER — IBUPROFEN 400 MG PO TABS
400.0000 mg | ORAL_TABLET | Freq: Four times a day (QID) | ORAL | Status: DC | PRN
Start: 2017-07-11 — End: 2017-07-12
  Administered 2017-07-11: 400 mg via ORAL
  Filled 2017-07-11: qty 1

## 2017-07-11 MED ORDER — VERAPAMIL HCL 2.5 MG/ML IV SOLN
INTRAVENOUS | Status: AC
  Filled 2017-07-11: qty 2

## 2017-07-11 MED ORDER — SODIUM CHLORIDE 0.9% FLUSH
3.0000 mL | Freq: Two times a day (BID) | INTRAVENOUS | Status: DC
  Administered 2017-07-11 – 2017-07-12 (×3): 3 mL via INTRAVENOUS

## 2017-07-11 MED ORDER — ONDANSETRON HCL 4 MG/2ML IJ SOLN: 4.0000 mg | Freq: Four times a day (QID) | INTRAMUSCULAR | Status: DC | PRN

## 2017-07-11 MED ORDER — LIDOCAINE HCL (PF) 1 % IJ SOLN
INTRAMUSCULAR | Status: AC
  Filled 2017-07-11: qty 30

## 2017-07-11 MED ORDER — IOPAMIDOL (ISOVUE-300) INJECTION 61%
INTRAVENOUS | Status: DC | PRN
  Administered 2017-07-11: 130 mL via INTRA_ARTERIAL

## 2017-07-11 MED ORDER — FUROSEMIDE 40 MG PO TABS
40.0000 mg | ORAL_TABLET | Freq: Two times a day (BID) | ORAL | Status: DC
Start: 2017-07-11 — End: 2017-07-12
  Administered 2017-07-11 – 2017-07-12 (×3): 40 mg via ORAL
  Filled 2017-07-11: qty 2
  Filled 2017-07-11 (×2): qty 1

## 2017-07-11 MED ORDER — MIDAZOLAM HCL 2 MG/2ML IJ SOLN
INTRAMUSCULAR | Status: DC | PRN
  Administered 2017-07-11: 1 mg via INTRAVENOUS

## 2017-07-11 MED ORDER — FENTANYL CITRATE (PF) 100 MCG/2ML IJ SOLN
INTRAMUSCULAR | Status: AC
  Filled 2017-07-11: qty 2

## 2017-07-11 MED ORDER — NITROGLYCERIN 5 MG/ML IV SOLN
INTRAVENOUS | Status: AC
  Filled 2017-07-11: qty 10

## 2017-07-11 MED ORDER — INSULIN ASPART 100 UNIT/ML ~~LOC~~ SOLN
0.0000 [IU] | Freq: Three times a day (TID) | SUBCUTANEOUS | Status: DC
  Administered 2017-07-11: 3 [IU] via SUBCUTANEOUS
  Administered 2017-07-11: 2 [IU] via SUBCUTANEOUS
  Administered 2017-07-11 – 2017-07-12 (×2): 3 [IU] via SUBCUTANEOUS
  Filled 2017-07-11 (×4): qty 1

## 2017-07-11 MED ORDER — ACETAMINOPHEN 325 MG PO TABS
650.0000 mg | ORAL_TABLET | ORAL | Status: DC | PRN
  Administered 2017-07-11: 650 mg via ORAL
  Filled 2017-07-11: qty 2

## 2017-07-11 MED ORDER — MIDAZOLAM HCL 2 MG/2ML IJ SOLN
INTRAMUSCULAR | Status: AC
  Filled 2017-07-11: qty 2

## 2017-07-11 MED ORDER — SACUBITRIL-VALSARTAN 24-26 MG PO TABS
1.0000 | ORAL_TABLET | Freq: Two times a day (BID) | ORAL | Status: DC
  Administered 2017-07-11 – 2017-07-12 (×3): 1 via ORAL
  Filled 2017-07-11 (×4): qty 1

## 2017-07-11 MED ORDER — SODIUM CHLORIDE 0.9 % IV BOLUS (SEPSIS)
1000.0000 mL | Freq: Once | INTRAVENOUS | Status: AC
  Administered 2017-07-11: 1000 mL via INTRAVENOUS

## 2017-07-11 MED ORDER — ATORVASTATIN CALCIUM 20 MG PO TABS
40.0000 mg | ORAL_TABLET | Freq: Every day | ORAL | Status: DC
  Administered 2017-07-11: 40 mg via ORAL
  Filled 2017-07-11: qty 2

## 2017-07-11 SURGICAL SUPPLY — 8 items
CATH HEARTRAIL 6F IL3.5 (CATHETERS) ×2 IMPLANT
CATH INFINITI 5FR ANG PIGTAIL (CATHETERS) ×2 IMPLANT
DEVICE INFLAT 30 PLUS (MISCELLANEOUS) ×2 IMPLANT
DEVICE RAD TR BAND REGULAR (VASCULAR PRODUCTS) ×2 IMPLANT
GLIDESHEATH SLEND SS 6F .021 (SHEATH) ×2 IMPLANT
KIT MANI 3VAL PERCEP (MISCELLANEOUS) ×2 IMPLANT
PACK CARDIAC CATH (CUSTOM PROCEDURE TRAY) ×2 IMPLANT
WIRE ROSEN-J .035X260CM (WIRE) ×2 IMPLANT

## 2017-07-11 NOTE — Progress Notes (Signed)
eLink Physician-Brief Progress Note Patient Name: Keith [name] DOB: 02/09/1971 MRN: [id number]   Date of Service  07/11/2017  HPI/Events of Note  50 M with h/o of DM/HTN/smoker presenting with SSCP a new BBB on EKG.  To cath lab where no specific obstructing lesions were found.  On camera check the patient is HD stable with HR of 73, BP of 152/91, RR of 15 and sats of 90% on [place] O2.  He appears comfortable and in NAD.  eICU Interventions  Plan for medical therapy per cardiology and primary admitting team. Continue to monitor via [facility]     Intervention Category Evaluation Type: New Patient Evaluation  Shamela Haydon 07/11/2017, 6:46 AM

## 2017-07-11 NOTE — ED Triage Notes (Signed)
Patient presents to Emergency Department via EMS with complaints of CP with shortness of breath and sweating that started at 0200 with central press pain 9/10 that faded to 3/10.    1 ppd smoker and prediabetic.  Hx HTN

## 2017-07-11 NOTE — ED Provider Notes (Signed)
[facility] Emergency Department Provider Note   First MD Initiated Contact with Patient 07/11/17 0413     (approximate)  I have reviewed the triage vital signs and the nursing notes.   HISTORY  Chief Complaint Chest Pain   HPI Keith [name] is a 46 y.o. male with bolus of chronic medical conditions presents to the emergency department with acute onset of central chest pressure, dyspnea and diaphoresis which started at 2:15 AM this morning. Patient states his initial pain score is 9 out of 10 but however is improved at this time her current pain score 6 out of 10. Patient denies any nausea or vomiting. Patient denies any cough or fever    Past Medical History:  Diagnosis Date  . Coronary artery disease   . Diabetes mellitus   . Frequent headaches   . Hypertension     Patient Active Problem List   Diagnosis Date Noted  . Migraine aura, persistent, with status migrainosus   . Hypertensive urgency 01/23/2015  . Slurred speech 01/23/2015  . Headache 01/23/2015  . DM (diabetes mellitus) with complications (HCC) 01/23/2015  . TIA (transient ischemic attack) 01/23/2015    Past Surgical History:  Procedure Laterality Date  . COLONOSCOPY WITH PROPOFOL N/A 02/19/2017   Procedure: COLONOSCOPY WITH PROPOFOL;  Surgeon: [name], MD;  Location: [facility] ENDOSCOPY;  Service: Endoscopy;  Laterality: N/A;  . ESOPHAGOGASTRODUODENOSCOPY (EGD) WITH PROPOFOL N/A 02/19/2017   Procedure: ESOPHAGOGASTRODUODENOSCOPY (EGD) WITH PROPOFOL;  Surgeon: [name], MD;  Location: [facility] ENDOSCOPY;  Service: Endoscopy;  Laterality: N/A;    Prior to Admission medications   Medication Sig Start Date End Date Taking? Authorizing Provider  dexlansoprazole (DEXILANT) 60 MG capsule Take 1 capsule (60 mg total) by mouth daily. 12/22/16   Donita Brooks, MD  losartan-hydrochlorothiazide (HYZAAR) 100-12.5 MG tablet Take 1 tablet by mouth daily. 12/16/16   Donita Brooks, MD    pantoprazole (PROTONIX) 40 MG tablet Take 1 tablet (40 mg total) by mouth daily. 12/22/16   Donita Brooks, MD    Allergies Other  Family History  Problem Relation Age of Onset  . Hypertension Mother   . Hypertension Father     Social History Social History  Substance Use Topics  . Smoking status: Current Every Day Smoker    Packs/day: 1.00    Types: Cigarettes  . Smokeless tobacco: Never Used  . Alcohol use No    Review of Systems Constitutional: No fever/chills Eyes: No visual changes. ENT: No sore throat. Cardiovascular: Positive for chest pain. Respiratory: Positive for shortness of breath. Gastrointestinal: No abdominal pain.  No nausea, no vomiting.  No diarrhea.  No constipation. Genitourinary: Negative for dysuria. Musculoskeletal: Negative for neck pain.  Negative for back pain. Integumentary: Negative for rash. Neurological: Negative for headaches, focal weakness or numbness.   ____________________________________________   PHYSICAL EXAM:  VITAL SIGNS: ED Triage Vitals  Enc Vitals Group     BP 07/11/17 0413 (!) 175/105     Pulse Rate 07/11/17 0413 86     Resp 07/11/17 0413 (!) 22     Temp 07/11/17 0413 98.1 F (36.7 C)     Temp Source 07/11/17 0413 Oral     SpO2 07/11/17 0410 92 %     Weight 07/11/17 0419 83.9 kg (185 lb)     Height 07/11/17 0419 1.803 m (5\' 11" )     Head Circumference --      Peak Flow --  Pain Score 07/11/17 0413 3     Pain Loc --      Pain Edu? --      Excl. in GC? --     Constitutional: Alert and oriented. Well appearing and in no acute distress. Eyes: Conjunctivae are normal.  Head: Atraumatic. Mouth/Throat: Mucous membranes are moist. Oropharynx non-erythematous. Neck: No stridor.  Cardiovascular: Normal rate, regular rhythm. Good peripheral circulation. Grossly normal heart sounds. Respiratory: Normal respiratory effort.  No retractions. Lungs CTAB. Gastrointestinal: Soft and nontender. No distention.   Musculoskeletal: No lower extremity tenderness nor edema. No gross deformities of extremities. Neurologic:  Normal speech and language. No gross focal neurologic deficits are appreciated.  Skin:  Skin is warm, dry and intact. No rash noted. Psychiatric: Mood and affect are normal. Speech and behavior are normal.  ____________________________________________   LABS (all labs ordered are listed, but only abnormal results are displayed)  Labs Reviewed  CBC  BASIC METABOLIC PANEL  TROPONIN I   ____________________________________________  EKG  ED ECG REPORT I, Claiborne N BROWN, the attending physician, personally viewed and interpreted this ECG.   Date: 07/11/2017  EKG Time: 4:15 AM  Rate: 88  Rhythm: Normal sinus rhythm ,V1 and V2 V3 ST segment elevation approximately 4 mm at maximum, ST segment depressions lead to 3 aVF and V5 V6  Axis: Normal  Intervals: QTc 528  ST&T Change: V1 and V2 V3 ST segment elevation approximately 4 mm at maximum, ST segment depressions lead to 3 aVF and V5 V6  ____________________________________________  RADIOLOGY I, Hayes N BROWN, personally viewed and evaluated these images (plain radiographs) as part of my medical decision making, as well as reviewing the written report by the radiologist.  No results found.  ____________________________________________   PROCEDURES  Critical Care performed: CRITICAL CARE Performed by: Darci Current   Total critical care time: 45 minutes  Critical care time was exclusive of separately billable procedures and treating other patients.  Critical care was necessary to treat or prevent imminent or life-threatening deterioration.  Critical care was time spent personally by me on the following activities: development of treatment plan with patient and/or surrogate as well as nursing, discussions with consultants, evaluation of patient's response to treatment, examination of patient, obtaining history  from patient or surrogate, ordering and performing treatments and interventions, ordering and review of laboratory studies, ordering and review of radiographic studies, pulse oximetry and re-evaluation of patient's condition.    Procedures   ____________________________________________   INITIAL IMPRESSION / ASSESSMENT AND PLAN / ED COURSE  Pertinent labs & imaging results that were available during my care of the patient were reviewed by me and considered in my medical decision making (see chart for details).  4:20 AM Patient given aspirin 324 mg heparin bolus infusion to follow. Patient discussed with Dr. Arridacardiologist on-call plan to take the patient to the Cath Lab.      ____________________________________________  FINAL CLINICAL IMPRESSION(S) / ED DIAGNOSES  Final diagnoses:  Anterior myocardial infarction (HCC)     MEDICATIONS GIVEN DURING THIS VISIT:  Medications  aspirin chewable tablet 324 mg (not administered)  heparin injection 4,000 Units (not administered)  heparin ADULT infusion 100 units/mL (25000 units/254mL sodium chloride 0.45%) (not administered)     NEW OUTPATIENT MEDICATIONS STARTED DURING THIS VISIT:  New Prescriptions   No medications on file    Modified Medications   No medications on file    Discontinued Medications   No medications on file     Note:  This document was prepared using Dragon voice recognition software and may include unintentional dictation errors.    Darci Current, MD 07/11/17 [phone]

## 2017-07-11 NOTE — Progress Notes (Addendum)
ANTICOAGULATION CONSULT NOTE - Initial Consult  Pharmacy Consult for heparin drip Indication: chest pain/ACS  Allergies  Allergen Reactions  . Other Other (See Comments)    Muscle Relaxers (psychosis)    Patient Measurements: Height: 5\' 11"  (180.3 cm) Weight: 185 lb (83.9 kg) IBW/kg (Calculated) : 75.3 Heparin Dosing Weight: 84kg  Vital Signs: Temp: 98.1 F (36.7 C) (08/26 0413) Temp Source: Oral (08/26 0413) BP: 175/105 (08/26 0413) Pulse Rate: 86 (08/26 0413)  Labs:  Recent Labs  07/11/17 0411  HGB 16.8  HCT 47.6  PLT 173    CrCl cannot be calculated (Patient's most recent lab result is older than the maximum 21 days allowed.).   Medical History: Past Medical History:  Diagnosis Date  . Coronary artery disease   . Diabetes mellitus   . Frequent headaches   . Hypertension     Medications:  No anticoagulation in current PTA med list  Assessment:  Goal of Therapy:  Heparin level 0.3-0.7 units/ml Monitor platelets by anticoagulation protocol: Yes   Plan:  4000 unit bolus and initial rate of 1200 units/hr. First heparin level 6 hours after start of infusion.  Keith [name] S 07/11/2017,4:31 AM

## 2017-07-11 NOTE — Consult Note (Signed)
Name: Keith [name] MRN: [id number] DOB: 1970/12/01    ADMISSION DATE:  07/11/2017  CONSULTATION DATE:  07/11/17  REFERRING MD : Dr. Sheryle Hail  CHIEF COMPLAINT:  Chest Pain  BRIEF PATIENT DESCRIPTION: 46 year old male with ST segment Infarction with new bundle branch block s/p   SIGNIFICANT EVENTS  8/26 Patient admitted to the SDU s/p left heart cath and coronary angiography  STUDIES:  8/26 Cardiac Cath>>There is no culprit for unstable angina. We need to start him on heart failure medications. 8/26 ECHO>> EF of 30-35%   HISTORY OF PRESENT ILLNESS:  Keith [name] is a 46 year old male with known history of CAD,DM, tobacco abuse and Hypertension.  Patient presented on 8/26 with acute onset of chest pain , shortness of breath and dry cough. Patient presented to ED on 8/26 via EMS and EKG was done which was concerning for ST depression MI with BBB. Patient was taken to the cath lab with no specific obstructing lesions. Cardiologist has recommended treatment with coreg,entresto and oral furosemide.  Patient placed in SDU for observation for now.  PAST MEDICAL HISTORY :   has a past medical history of Coronary artery disease; Diabetes mellitus; Frequent headaches; and Hypertension.  has a past surgical history that includes Esophagogastroduodenoscopy (egd) with propofol (N/A, 02/19/2017) and Colonoscopy with propofol (N/A, 02/19/2017). Prior to Admission medications   Medication Sig Start Date End Date Taking? Authorizing Provider  dexlansoprazole (DEXILANT) 60 MG capsule Take 1 capsule (60 mg total) by mouth daily. 12/22/16   Donita Brooks, MD  losartan-hydrochlorothiazide (HYZAAR) 100-12.5 MG tablet Take 1 tablet by mouth daily. 12/16/16   Donita Brooks, MD  pantoprazole (PROTONIX) 40 MG tablet Take 1 tablet (40 mg total) by mouth daily. 12/22/16   Donita Brooks, MD   Allergies  Allergen Reactions  . Other Other (See Comments)    Muscle Relaxers (psychosis)    FAMILY HISTORY:    family history includes Hypertension in his father and mother. SOCIAL HISTORY:  reports that he has been smoking Cigarettes.  He has been smoking about 1.00 pack per day. He has never used smokeless tobacco. He reports that he does not drink alcohol or use drugs.  REVIEW OF SYSTEMS:   Constitutional: Negative for fever, chills, weight loss, malaise/fatigue and diaphoresis.  HENT: Negative for hearing loss, ear pain, nosebleeds, congestion, sore throat, neck pain, tinnitus and ear discharge.   Eyes: Negative for blurred vision, double vision, photophobia, pain, discharge and redness.  Respiratory: Negative for cough, hemoptysis, sputum production, shortness of breath, wheezing and stridor.   Cardiovascular: Negative for chest pain, palpitations, orthopnea, claudication, leg swelling and PND.  Gastrointestinal: Negative for heartburn, nausea, vomiting, abdominal pain, diarrhea, constipation, blood in stool and melena.  Genitourinary: Negative for dysuria, urgency, frequency, hematuria and flank pain.  Musculoskeletal: Negative for myalgias, back pain, joint pain and falls.  Skin: Negative for itching and rash.  Neurological: Negative for dizziness, tingling, tremors, sensory change, speech change, focal weakness, seizures, loss of consciousness, weakness and headaches.  Endo/Heme/Allergies: Negative for environmental allergies and polydipsia. Does not bruise/bleed easily.  SUBJECTIVE: Patient noded his head indicating that he is feeling better  VITAL SIGNS: Temp:  [98.1 F (36.7 C)-98.4 F (36.9 C)] 98.4 F (36.9 C) (08/26 0621) Pulse Rate:  [79-97] 79 (08/26 0621) Resp:  [17-25] 17 (08/26 0621) BP: (152-195)/(91-129) 152/91 (08/26 0621) SpO2:  [92 %-96 %] 96 % (08/26 0621) Weight:  [83.9 kg (185 lb)-95.6 kg (210 lb 12.2  oz)] 95.6 kg (210 lb 12.2 oz) (08/26 1610)  PHYSICAL EXAMINATION: General:  Well built male, in no acute distress Neuro:  Awake,oriented HEENT:  AT,[place],no  jvd Cardiovascular:  S1s2,regular, no m/r/ Lungs: Clear bilaterally, no wheezes,crackles and rhonchi noted Abdomen: soft,NT,ND Musculoskeletal: No edema,cyanosis noted Skin: warm,dry and intact   Recent Labs Lab 07/11/17 0411  NA 137  K 3.5  CL 103  CO2 27  BUN 11  CREATININE 0.75  GLUCOSE 195*    Recent Labs Lab 07/11/17 0411  HGB 16.8  HCT 47.6  WBC 7.0  PLT 173   Dg Chest Port 1 View  Result Date: 07/11/2017 CLINICAL DATA:  Shortness of breath.  Central chest pain. EXAM: PORTABLE CHEST 1 VIEW COMPARISON:  09/17/2014 FINDINGS: Heart size and mediastinal contours are normal for technique. Mild right perihilar atelectasis. No confluent airspace disease. No pulmonary edema, large pleural effusion or pneumothorax. No acute osseous abnormalities are seen. IMPRESSION: Mild right perihilar atelectasis. Electronically Signed   By: Rubye Oaks M.D.   On: 07/11/2017 04:44    ASSESSMENT / PLAN:  Possible ST elevation myocardial infarction with new left bundle branch block Hypertension CAD Tobacco use DM  Plan Cardiology following the patient Continue carvedilol and Entresto per cardiology recommendation Smoking cessation councelling F/u ECHO Continue aspirin  Continue lasix Continue Atorvastatin Blood glucose checks with SSI coverage    Cire Clute,AG-ACNP Pulmonary and Critical Care Medicine [facility]   07/11/2017, 6:31 AM

## 2017-07-11 NOTE — ED Notes (Signed)
Report given to cath lab. 

## 2017-07-11 NOTE — Consult Note (Signed)
Cardiology Consultation:   Patient ID: Keith [name]; [id number]; [birth year]   Admit date: 07/11/2017 Date of Consult: 07/11/2017  Primary Care Provider: Donita Brooks, MD Primary Cardiologist: New   Patient Profile:   Keith [name] is a 46 y.o. male with a hx of hypertension, diabetes and smoking who is being seen today for the evaluation of chest pain at the request of Dr. Manson Passey.  History of Present Illness:   Keith [name] is a 46 year old male with no prior cardiac history who presented with acute onset of substernal chest tightness that started around 2:00 this morning. He has prolonged history of uncontrolled hypertension, tobacco use and type 2 diabetes. He has not been taking his medications on a regular basis. Over the last 2 weeks, he has experienced increased shortness of breath and cough and thought it was due to bronchitis. This morning he started having substernal chest tightness associated with shortness of breath and dry cough. The discomfort was continuous and thus he called EMS. EKG showed left bundle branch block which is new compared to his prior EKG in 2016. The patient was given aspirin and a bolus of unfractionated heparin. A code STEMI was called.  Past Medical History:  Diagnosis Date  . Coronary artery disease   . Diabetes mellitus   . Frequent headaches   . Hypertension     Past Surgical History:  Procedure Laterality Date  . COLONOSCOPY WITH PROPOFOL N/A 02/19/2017   Procedure: COLONOSCOPY WITH PROPOFOL;  Surgeon: [name], MD;  Location: [facility] ENDOSCOPY;  Service: Endoscopy;  Laterality: N/A;  . ESOPHAGOGASTRODUODENOSCOPY (EGD) WITH PROPOFOL N/A 02/19/2017   Procedure: ESOPHAGOGASTRODUODENOSCOPY (EGD) WITH PROPOFOL;  Surgeon: [name], MD;  Location: [facility] ENDOSCOPY;  Service: Endoscopy;  Laterality: N/A;     Home Medications:  Prior to Admission medications   Medication Sig Start Date End Date Taking? Authorizing Provider  dexlansoprazole  (DEXILANT) 60 MG capsule Take 1 capsule (60 mg total) by mouth daily. 12/22/16   Donita Brooks, MD  losartan-hydrochlorothiazide (HYZAAR) 100-12.5 MG tablet Take 1 tablet by mouth daily. 12/16/16   Donita Brooks, MD  pantoprazole (PROTONIX) 40 MG tablet Take 1 tablet (40 mg total) by mouth daily. 12/22/16   Donita Brooks, MD    Inpatient Medications: Scheduled Meds: . heparin  4,000 Units Intravenous Once   Continuous Infusions:  PRN Meds:   Allergies:    Allergies  Allergen Reactions  . Other Other (See Comments)    Muscle Relaxers (psychosis)    Social History:   Social History   Social History  . Marital status: Married    Spouse name: N/A  . Number of children: N/A  . Years of education: N/A   Occupational History  . Not on file.   Social History Main Topics  . Smoking status: Current Every Day Smoker    Packs/day: 1.00    Types: Cigarettes  . Smokeless tobacco: Never Used  . Alcohol use No  . Drug use: No  . Sexual activity: Not on file   Other Topics Concern  . Not on file   Social History Narrative  . No narrative on file    Family History:    Family History  Problem Relation Age of Onset  . Hypertension Mother   . Hypertension Father      ROS:  Please see the history of present illness.  ROS  All other ROS reviewed and negative.     Physical Exam/Data:   Vitals:  07/11/17 0440 07/11/17 0450 07/11/17 0452 07/11/17 0519  BP:   (!) 186/129   Pulse: 88 97 94   Resp: (!) 21 18 (!) 25   Temp:      TempSrc:      SpO2: 96% 94% 96% 92%  Weight:      Height:       No intake or output data in the 24 hours ending 07/11/17 0616 Filed Weights   07/11/17 0419  Weight: 185 lb (83.9 kg)   Body mass index is 25.8 kg/m.  General:  Well nourished, well developed, in no acute distress HEENT: normal Lymph: no adenopathy Neck: no JVD Endocrine:  No thryomegaly Vascular: No carotid bruits; FA pulses 2+ bilaterally without bruits  Cardiac:   normal S1, S2; RRR; no murmur  Lungs:  clear to auscultation bilaterally, no wheezing, rhonchi or rales  Abd: soft, nontender, no hepatomegaly  Ext: no edema Musculoskeletal:  No deformities, BUE and BLE strength normal and equal Skin: warm and dry  Neuro:  CNs 2-12 intact, no focal abnormalities noted Psych:  Normal affect   EKG:  The EKG was personally reviewed and demonstrates:  NSR with LBBB Telemetry:  Telemetry was personally reviewed and demonstrates:  NSR  Relevant CV Studies   Laboratory Data:  Chemistry Recent Labs Lab 07/11/17 0411  NA 137  K 3.5  CL 103  CO2 27  GLUCOSE 195*  BUN 11  CREATININE 0.75  CALCIUM 8.9  GFRNONAA >60  GFRAA >60  ANIONGAP 7    No results for input(s): PROT, ALBUMIN, AST, ALT, ALKPHOS, BILITOT in the last 168 hours. Hematology Recent Labs Lab 07/11/17 0411  WBC 7.0  RBC 5.48  HGB 16.8  HCT 47.6  MCV 86.8  MCH 30.7  MCHC 35.3  RDW 13.8  PLT 173   Cardiac Enzymes Recent Labs Lab 07/11/17 0411  TROPONINI <0.03   No results for input(s): TROPIPOC in the last 168 hours.  BNPNo results for input(s): BNP, PROBNP in the last 168 hours.  DDimer No results for input(s): DDIMER in the last 168 hours.  Radiology/Studies:  Dg Chest Port 1 View  Result Date: 07/11/2017 CLINICAL DATA:  Shortness of breath.  Central chest pain. EXAM: PORTABLE CHEST 1 VIEW COMPARISON:  09/17/2014 FINDINGS: Heart size and mediastinal contours are normal for technique. Mild right perihilar atelectasis. No confluent airspace disease. No pulmonary edema, large pleural effusion or pneumothorax. No acute osseous abnormalities are seen. IMPRESSION: Mild right perihilar atelectasis. Electronically Signed   By: Rubye Oaks M.D.   On: 07/11/2017 04:44    Assessment and Plan:   1. Possible ST elevation myocardial infarction with new left bundle branch block: The patient symptoms and EKG are concerning for possible anterior myocardial infarction manifested  with new left bundle branch block. Thus, emergent cardiac catheterization was recommended and performed which showed mild nonobstructive coronary artery disease. However, his EF was noted to be reduced at 30-35% with moderately elevated left ventricular end-diastolic pressure. Aortic root angiography showed no evidence of ascending aortic aneurysm or dissection. I suspect that the patient's presentation is likely due to acute systolic heart failure related to hypertensive heart disease. I initiated treatment with carvedilol and Entresto (he tolerated losartan in the past but has not been taking the medication on a regular basis). I started gentle diuresis with furosemide 40 mg by mouth twice daily. Uptitrate medications as tolerated by blood pressure and consider adding spironolactone before hospital discharge. 2. Essential hypertension: Blood pressure is uncontrolled.  He was supposed to be taking losartan-hydrochlorothiazide. This was switched as outlined above. 3. Tobacco use: Smoking cessation is advised. 4. Diabetes mellitus: He was supposed to be on metformin but has not been taking the medication.   Signed, Lorine Bears, MD  07/11/2017 6:16 AM

## 2017-07-11 NOTE — H&P (Addendum)
Keith [name] is an 46 y.o. male.   Chief Complaint: chest pain HPI: the patient presents to the emergency department via EMS after awakening with chest pain. The patient states the pain was centralized and did not radiate. He admits to shortness of breath but denies nausea, vomiting or diaphoresis. EKG showed a new left bundle branch block which prompted immediate heart catheterization. He has not found to have significant coronary artery disease but did have an EF of 30-35%. His chest pain resolved prior to heart catheterization and after aspirin administration. He tolerated the procedure without complication and was stable at the time of admission to the ICU. Hospitalist service was called for further management.  Past Medical History:  Diagnosis Date  . Coronary artery disease   . Diabetes mellitus   . Frequent headaches   . Hypertension     Past Surgical History:  Procedure Laterality Date  . COLONOSCOPY WITH PROPOFOL N/A 02/19/2017   Procedure: COLONOSCOPY WITH PROPOFOL;  Surgeon: Jonathon Bellows, MD;  Location: [facility] ENDOSCOPY;  Service: Endoscopy;  Laterality: N/A;  . ESOPHAGOGASTRODUODENOSCOPY (EGD) WITH PROPOFOL N/A 02/19/2017   Procedure: ESOPHAGOGASTRODUODENOSCOPY (EGD) WITH PROPOFOL;  Surgeon: Jonathon Bellows, MD;  Location: [facility] ENDOSCOPY;  Service: Endoscopy;  Laterality: N/A;    Family History  Problem Relation Age of Onset  . Hypertension Mother   . Hypertension Father    Social History:  reports that he has been smoking Cigarettes.  He has been smoking about 1.00 pack per day. He has never used smokeless tobacco. He reports that he does not drink alcohol or use drugs.  Allergies:  Allergies  Allergen Reactions  . Other Other (See Comments)    Muscle Relaxers (psychosis)    Medications Prior to Admission  Medication Sig Dispense Refill  . dexlansoprazole (DEXILANT) 60 MG capsule Take 1 capsule (60 mg total) by mouth daily. 30 capsule 5  . losartan-hydrochlorothiazide (HYZAAR)  100-12.5 MG tablet Take 1 tablet by mouth daily. 90 tablet 3  . pantoprazole (PROTONIX) 40 MG tablet Take 1 tablet (40 mg total) by mouth daily. 30 tablet 11    Results for orders placed or performed during the hospital encounter of 07/11/17 (from the past 48 hour(s))  Basic metabolic panel     Status: Abnormal   Collection Time: 07/11/17  4:11 AM  Result Value Ref Range   Sodium 137 135 - 145 mmol/L   Potassium 3.5 3.5 - 5.1 mmol/L   Chloride 103 101 - 111 mmol/L   CO2 27 22 - 32 mmol/L   Glucose, Bld 195 (H) 65 - 99 mg/dL   BUN 11 6 - 20 mg/dL   Creatinine, Ser 0.75 0.61 - 1.24 mg/dL   Calcium 8.9 8.9 - 10.3 mg/dL   GFR calc non Af Amer >60 >60 mL/min   GFR calc Af Amer >60 >60 mL/min    Comment: (NOTE) The eGFR has been calculated using the CKD EPI equation. This calculation has not been validated in all clinical situations. eGFR's persistently <60 mL/min signify possible Chronic Kidney Disease.    Anion gap 7 5 - 15  CBC     Status: None   Collection Time: 07/11/17  4:11 AM  Result Value Ref Range   WBC 7.0 3.8 - 10.6 K/uL   RBC 5.48 4.40 - 5.90 MIL/uL   Hemoglobin 16.8 13.0 - 18.0 g/dL   HCT 47.6 40.0 - 52.0 %   MCV 86.8 80.0 - 100.0 fL   MCH 30.7 26.0 - 34.0 pg  MCHC 35.3 32.0 - 36.0 g/dL   RDW 13.8 11.5 - 14.5 %   Platelets 173 150 - 440 K/uL  Troponin I     Status: None   Collection Time: 07/11/17  4:11 AM  Result Value Ref Range   Troponin I <0.03 <0.03 ng/mL  APTT     Status: None   Collection Time: 07/11/17  4:11 AM  Result Value Ref Range   aPTT 32 24 - 36 seconds  Protime-INR     Status: None   Collection Time: 07/11/17  4:11 AM  Result Value Ref Range   Prothrombin Time 12.4 11.4 - 15.2 seconds   INR 0.92    Dg Chest Port 1 View  Result Date: 07/11/2017 CLINICAL DATA:  Shortness of breath.  Central chest pain. EXAM: PORTABLE CHEST 1 VIEW COMPARISON:  09/17/2014 FINDINGS: Heart size and mediastinal contours are normal for technique. Mild right  perihilar atelectasis. No confluent airspace disease. No pulmonary edema, large pleural effusion or pneumothorax. No acute osseous abnormalities are seen. IMPRESSION: Mild right perihilar atelectasis. Electronically Signed   By: Jeb Levering M.D.   On: 07/11/2017 04:44    Review of Systems  Constitutional: Negative for chills and fever.  HENT: Negative for sore throat and tinnitus.   Eyes: Negative for blurred vision and redness.  Respiratory: Positive for shortness of breath (resolved). Negative for cough.   Cardiovascular: Positive for chest pain (resolved). Negative for palpitations, orthopnea and PND.  Gastrointestinal: Negative for abdominal pain, diarrhea, nausea and vomiting.  Genitourinary: Negative for dysuria, frequency and urgency.  Musculoskeletal: Negative for joint pain and myalgias.  Skin: Negative for rash.       No lesions  Neurological: Positive for dizziness. Negative for speech change, focal weakness and weakness.  Endo/Heme/Allergies: Does not bruise/bleed easily.       No temperature intolerance  Psychiatric/Behavioral: Negative for depression and suicidal ideas.    Blood pressure (!) 152/91, pulse 79, temperature 98.4 F (36.9 C), temperature source Oral, resp. rate 17, height 5' 11"  (1.803 m), weight 95.6 kg (210 lb 12.2 oz), SpO2 96 %. Physical Exam  Constitutional: He is oriented to person, place, and time. He appears well-developed and well-nourished. No distress.  HENT:  Head: Normocephalic and atraumatic.  Mouth/Throat: Oropharynx is clear and moist.  Eyes: Pupils are equal, round, and reactive to light. Conjunctivae and EOM are normal. No scleral icterus.  Neck: Normal range of motion. Neck supple. No JVD present. No tracheal deviation present. No thyromegaly present.  Cardiovascular: Normal rate, regular rhythm and normal heart sounds.  Exam reveals no gallop and no friction rub.   No murmur heard. Respiratory: Effort normal and breath sounds normal.  No respiratory distress.  GI: Soft. Bowel sounds are normal. He exhibits no distension. There is no tenderness.  Genitourinary:  Genitourinary Comments: Deferred  Musculoskeletal: Normal range of motion. He exhibits no edema.  Lymphadenopathy:    He has no cervical adenopathy.  Neurological: He is alert and oriented to person, place, and time. No cranial nerve deficit.  Skin: Skin is warm and dry. No rash noted. No erythema.  Psychiatric: He has a normal mood and affect. His behavior is normal. Judgment and thought content normal.     Assessment/Plan This is a 46 year old male admitted for NSTEMI. 1. NSTEMI: continue aspirin and secondary prevention. Monitor telemetry 2. Hypertension:uncontrolled; continue carvedilol. Hydralazine as needed. He has been noncompliant with medication in the past. 3. Diabetes mellitus type 2: Check including A1c. Initiate insulin  therapy if needed. 4.CHF: Systolic, chronic. The patient has been started on an Entresto and Lasix. He does not have pulmonary edema at this time 5. DVT prophylaxis:Lovenox 6. GI prophylaxis: resume reflux medicine per home regimen The patient is a full code. Time spent on admission orders and critical care approximately 45 minutes  Harrie Foreman, MD 07/11/2017, 6:53 AM

## 2017-07-11 NOTE — Progress Notes (Signed)
eLink Physician-Brief Progress Note Patient Name: Keith [name] DOB: May 18, 1971 MRN: [id number]   Date of Service  07/11/2017  HPI/Events of Note  Headache - No relief with Tylenol PO. Creatinine = 0.75.  eICU Interventions  Will order: 1. Motrin 400 mg PO Q 6 hours PRN headache.      Intervention Category Intermediate Interventions: Pain - evaluation and management  Sommer,Steven Eugene 07/11/2017, 4:30 PM

## 2017-07-11 NOTE — Progress Notes (Signed)
[employer] [facility] - [place] at [facility]                                                                                                                                                                                  Patient Demographics   Keith [name], is a 46 y.o. male, DOB - Dec 20, 1970, MVH:[id number]  Admit date - 07/11/2017   Admitting Physician Arnaldo Natal, MD  Outpatient Primary MD for the patient is Donita Brooks, MD   LOS - 0  Subjective:  Pt admited with cp, Underwent cardiac catheterization which shows 30% RCA and circumflex lesions, patient currently has some cough but no chest pain    Review of Systems:   CONSTITUTIONAL: No documented fever. No fatigue, weakness. No weight gain, no weight loss.  EYES: No blurry or double vision.  ENT: No tinnitus. No postnasal drip. No redness of the oropharynx.  RESPIRATORY:Positive  cough, no wheeze, no hemoptysis. No dyspnea.  CARDIOVASCULAR: No chest pain. No orthopnea. No palpitations. No syncope.  GASTROINTESTINAL: No nausea, no vomiting or diarrhea. No abdominal pain. No melena or hematochezia.  GENITOURINARY: No dysuria or hematuria.  ENDOCRINE: No polyuria or nocturia. No heat or cold intolerance.  HEMATOLOGY: No anemia. No bruising. No bleeding.  INTEGUMENTARY: No rashes. No lesions.  MUSCULOSKELETAL: No arthritis. No swelling. No gout.  NEUROLOGIC: No numbness, tingling, or ataxia. No seizure-type activity.  PSYCHIATRIC: No anxiety. No insomnia. No ADD.    Vitals:   Vitals:   07/11/17 1100 07/11/17 1200 07/11/17 1300 07/11/17 1520  BP: 139/86 138/82 136/78 (!) 147/93  Pulse: 71 75 71 74  Resp: 20 15 17 18   Temp:      TempSrc:      SpO2: 98% 98% 93% 95%  Weight:      Height:        Wt Readings from Last 3 Encounters:  07/11/17 210 lb 12.2 oz (95.6 kg)  02/19/17 174 lb (78.9 kg)  01/18/17 175 lb (79.4 kg)     Intake/Output Summary (Last 24 hours) at 07/11/17 1821 Last data filed at  07/11/17 0709  Gross per 24 hour  Intake                0 ml  Output              450 ml  Net             -450 ml    Physical Exam:   GENERAL: Pleasant-appearing in no apparent distress.  HEAD, EYES, EARS, NOSE AND THROAT: Atraumatic, normocephalic. Extraocular muscles are intact. Pupils equal and reactive to light. Sclerae anicteric. No conjunctival injection. No oro-pharyngeal erythema.  NECK: Supple. There is no jugular venous distention. No bruits, no lymphadenopathy, no thyromegaly.  HEART: Regular rate and rhythm,. No murmurs, no rubs, no clicks.  LUNGS: Clear to auscultation bilaterally. No rales or rhonchi. No wheezes.  ABDOMEN: Soft, flat, nontender, nondistended. Has good bowel sounds. No hepatosplenomegaly appreciated.  EXTREMITIES: No evidence of any cyanosis, clubbing, or peripheral edema.  +2 pedal and radial pulses bilaterally.  NEUROLOGIC: The patient is alert, awake, and oriented x3 with no focal motor or sensory deficits appreciated bilaterally.  SKIN: Moist and warm with no rashes appreciated.  Psych: Not anxious, depressed LN: No inguinal LN enlargement    Antibiotics   Anti-infectives    None      Medications   Scheduled Meds: . aspirin  81 mg Oral Daily  . atorvastatin  40 mg Oral q1800  . carvedilol  6.25 mg Oral BID WC  . [START ON 07/12/2017] enoxaparin (LOVENOX) injection  40 mg Subcutaneous Q24H  . furosemide  40 mg Oral BID  . insulin aspart  0-15 Units Subcutaneous TID WC  . sacubitril-valsartan  1 tablet Oral BID  . sodium chloride flush  3 mL Intravenous Q12H   Continuous Infusions: . sodium chloride     PRN Meds:.sodium chloride, acetaminophen, ibuprofen, ondansetron (ZOFRAN) IV, sodium chloride flush   Data Review:   Micro Results No results found for this or any previous visit (from the past 240 hour(s)).  Radiology Reports Dg Chest Port 1 View  Result Date: 07/11/2017 CLINICAL DATA:  Shortness of breath.  Central chest pain.  EXAM: PORTABLE CHEST 1 VIEW COMPARISON:  09/17/2014 FINDINGS: Heart size and mediastinal contours are normal for technique. Mild right perihilar atelectasis. No confluent airspace disease. No pulmonary edema, large pleural effusion or pneumothorax. No acute osseous abnormalities are seen. IMPRESSION: Mild right perihilar atelectasis. Electronically Signed   By: Rubye Oaks M.D.   On: 07/11/2017 04:44     CBC  Recent Labs Lab 07/11/17 0411  WBC 7.0  HGB 16.8  HCT 47.6  PLT 173  MCV 86.8  MCH 30.7  MCHC 35.3  RDW 13.8    Chemistries   Recent Labs Lab 07/11/17 0411  NA 137  K 3.5  CL 103  CO2 27  GLUCOSE 195*  BUN 11  CREATININE 0.75  CALCIUM 8.9   ------------------------------------------------------------------------------------------------------------------ estimated creatinine clearance is 136.1 mL/min (by C-G formula based on SCr of 0.75 mg/dL). ------------------------------------------------------------------------------------------------------------------ No results for input(s): HGBA1C in the last 72 hours. ------------------------------------------------------------------------------------------------------------------ No results for input(s): CHOL, HDL, LDLCALC, TRIG, CHOLHDL, LDLDIRECT in the last 72 hours. ------------------------------------------------------------------------------------------------------------------ No results for input(s): TSH, T4TOTAL, T3FREE, THYROIDAB in the last 72 hours.  Invalid input(s): FREET3 ------------------------------------------------------------------------------------------------------------------ No results for input(s): VITAMINB12, FOLATE, FERRITIN, TIBC, IRON, RETICCTPCT in the last 72 hours.  Coagulation profile  Recent Labs Lab 07/11/17 0411  INR 0.92    No results for input(s): DDIMER in the last 72 hours.  Cardiac Enzymes  Recent Labs Lab 07/11/17 0411  TROPONINI <0.03    ------------------------------------------------------------------------------------------------------------------ Invalid input(s): POCBNP    Assessment & Plan   1. Chest pain status post With minimal occlusive coronary artery disease  2. Hypertension:uncontrolled; continue carvedilol. Hydralazine as needed. He has been noncompliant with medication in the past.Continue therapy with aspirin  3. Diabetes mellitus type 2:Continue sliding scale insulin as needed Hemoglobin A1c pending 4.CHF: Systolic, chronic. Continue therapy with Lasix as per cardiology and etnresto 5. DVT prophylaxis:Lovenox 6. GI prophylaxis: resume reflux medicine per home regimen  Code Status Orders        Start     Ordered   07/11/17 0617  Full code  Continuous     07/11/17 0616    Code Status History    Date Active Date Inactive Code Status Order ID Comments User Context   01/23/2015  5:42 PM 01/24/2015  4:20 PM Full Code [id number]  Calvert Cantor, MD ED           Consults  cardiology  DVT Prophylaxis  lovenox  Lab Results  Component Value Date   PLT 173 07/11/2017     Time Spent in minutes    Greater than 50% of time spent in care coordination and counseling patient regarding the condition and plan of care.   Auburn Bilberry M.D on 07/11/2017 at 6:21 PM  Between 7am to 6pm - Pager - [phone]  After 6pm go to [email or web address] - password [email or web address] [facility]  [facility] [place] Hospital[facility]   Office  [phone]

## 2017-07-11 NOTE — Progress Notes (Signed)
*  PRELIMINARY RESULTS* Echocardiogram 2D Echocardiogram has been performed. Definity Contrast used on this exam.  Keith [name] 07/11/2017, 1:54 PM

## 2017-07-12 ENCOUNTER — Ambulatory Visit: Payer: Self-pay | Admitting: *Deleted

## 2017-07-12 ENCOUNTER — Encounter: Payer: Self-pay | Admitting: Cardiovascular Disease

## 2017-07-12 ENCOUNTER — Telehealth: Payer: Self-pay | Admitting: Cardiovascular Disease

## 2017-07-12 DIAGNOSIS — I5021 Acute systolic (congestive) heart failure: Secondary | ICD-10-CM | POA: Diagnosis not present

## 2017-07-12 DIAGNOSIS — I5022 Chronic systolic (congestive) heart failure: Secondary | ICD-10-CM

## 2017-07-12 LAB — BASIC METABOLIC PANEL
Anion gap: 10 (ref 5–15)
BUN: 14 mg/dL (ref 6–20)
CHLORIDE: 103 mmol/L (ref 101–111)
CO2: 25 mmol/L (ref 22–32)
Calcium: 8.6 mg/dL — ABNORMAL LOW (ref 8.9–10.3)
Creatinine, Ser: 0.72 mg/dL (ref 0.61–1.24)
GFR calc non Af Amer: >60 mL/min (ref >60)
Glucose, Bld: 250 mg/dL — ABNORMAL HIGH (ref 65–99)
POTASSIUM: 3.5 mmol/L (ref 3.5–5.1)
SODIUM: 138 mmol/L (ref 135–145)

## 2017-07-12 LAB — ECHOCARDIOGRAM COMPLETE
HEIGHTINCHES: 71 in
WEIGHTICAEL: 3372.16 [oz_av]

## 2017-07-12 LAB — GLUCOSE, CAPILLARY
Glucose-Capillary: 169 mg/dL — ABNORMAL HIGH (ref 65–99)
Glucose-Capillary: 175 mg/dL — ABNORMAL HIGH (ref 65–99)

## 2017-07-12 MED ORDER — PANTOPRAZOLE SODIUM 40 MG PO TBEC
40.0000 mg | DELAYED_RELEASE_TABLET | Freq: Every day | ORAL | Status: DC
  Administered 2017-07-12: 40 mg via ORAL
  Filled 2017-07-12: qty 1

## 2017-07-12 MED ORDER — NICOTINE 14 MG/24HR TD PT24
14.0000 mg | MEDICATED_PATCH | Freq: Every day | TRANSDERMAL | Status: DC
  Administered 2017-07-12: 14 mg via TRANSDERMAL
  Filled 2017-07-12: qty 1

## 2017-07-12 MED ORDER — CARVEDILOL 12.5 MG PO TABS: 12.5000 mg | ORAL_TABLET | Freq: Two times a day (BID) | ORAL | Status: DC

## 2017-07-12 MED ORDER — SACUBITRIL-VALSARTAN 24-26 MG PO TABS: 1.0000 | ORAL_TABLET | Freq: Two times a day (BID) | ORAL | 0 refills | Status: DC

## 2017-07-12 MED ORDER — ASPIRIN 81 MG PO CHEW: 81.0000 mg | CHEWABLE_TABLET | Freq: Every day | ORAL | 2 refills | Status: DC

## 2017-07-12 MED ORDER — SPIRONOLACTONE 25 MG PO TABS: 25.0000 mg | ORAL_TABLET | Freq: Every day | ORAL | 1 refills | Status: DC

## 2017-07-12 MED ORDER — CARVEDILOL 12.5 MG PO TABS: 12.5000 mg | ORAL_TABLET | Freq: Two times a day (BID) | ORAL | 1 refills | Status: DC

## 2017-07-12 MED ORDER — FUROSEMIDE 40 MG PO TABS: 40.0000 mg | ORAL_TABLET | Freq: Every day | ORAL | Status: DC

## 2017-07-12 MED ORDER — ATORVASTATIN CALCIUM 40 MG PO TABS: 40.0000 mg | ORAL_TABLET | Freq: Every day | ORAL | 2 refills | Status: DC

## 2017-07-12 MED ORDER — SPIRONOLACTONE 25 MG PO TABS
25.0000 mg | ORAL_TABLET | Freq: Every day | ORAL | Status: DC
  Administered 2017-07-12: 25 mg via ORAL
  Filled 2017-07-12: qty 1

## 2017-07-12 MED ORDER — CARVEDILOL 6.25 MG PO TABS
6.2500 mg | ORAL_TABLET | Freq: Once | ORAL | Status: AC
  Administered 2017-07-12: 6.25 mg via ORAL
  Filled 2017-07-12: qty 1

## 2017-07-12 MED ORDER — FUROSEMIDE 40 MG PO TABS: 40.0000 mg | ORAL_TABLET | Freq: Every day | ORAL | 1 refills | Status: DC

## 2017-07-12 NOTE — Progress Notes (Signed)
[facility] - [place] at [facility] Keith [name] was admitted to the Hospital on 07/11/2017 and Discharged  07/12/2017 and should be excused from work/school   for 10  days starting 07/11/2017 , may return to work/school without any restrictions.  [name] M.D on 07/12/2017,at 10:31 AM  [facility] - [place] at [facility]  [phone]

## 2017-07-12 NOTE — Care Management (Addendum)
RNCM notified by RN that patient is discharging and may be more appropriate for OBServation status. Entresto web site info along with Good Rx card shared with patient and wife in case his [employer] does not cover it.

## 2017-07-12 NOTE — Progress Notes (Signed)
Patient Name: Keith [name] Date of Encounter: 07/12/2017  Primary Cardiologist: New to [facility] - consult by [facility] Problem List     Active Problems:   Unstable angina (HCC)   Acute systolic heart failure (HCC)   Other chest pain     Subjective   No chest pain or SOB. LHC on 6/24 showed nonobstructive disease with EF 30-35%. Echo pending. BP improving, though remains elevated in the 140-150 systolic range. No labs this morning. Documented UOP of 500 mL. TTE pending.   Patient reports he has had HTN for years and is "not very good at taking medications."   Inpatient Medications    Scheduled Meds: . aspirin  81 mg Oral Daily  . atorvastatin  40 mg Oral q1800  . carvedilol  12.5 mg Oral BID WC  . carvedilol  6.25 mg Oral Once  . enoxaparin (LOVENOX) injection  40 mg Subcutaneous Q24H  . furosemide  40 mg Oral BID  . insulin aspart  0-15 Units Subcutaneous TID WC  . sacubitril-valsartan  1 tablet Oral BID  . sodium chloride flush  3 mL Intravenous Q12H   Continuous Infusions: . sodium chloride     PRN Meds: sodium chloride, acetaminophen, ibuprofen, ondansetron (ZOFRAN) IV, sodium chloride flush   Vital Signs    Vitals:   07/11/17 1300 07/11/17 1520 07/11/17 2013 07/12/17 0432  BP: 136/78 (!) 147/93 (!) 146/84 (!) 151/85  Pulse: 71 74 71 73  Resp: 17 18 18    Temp:   97.9 F (36.6 C) 97.6 F (36.4 C)  TempSrc:   Oral Oral  SpO2: 93% 95% 92% 95%  Weight:      Height:        Intake/Output Summary (Last 24 hours) at 07/12/17 0804 Last data filed at 07/12/17 0432  Gross per 24 hour  Intake              243 ml  Output              295 ml  Net              -52 ml   Filed Weights   07/11/17 0419 07/11/17 0621  Weight: 185 lb (83.9 kg) 210 lb 12.2 oz (95.6 kg)    Physical Exam    GEN: Well nourished, well developed, in no acute distress.  HEENT: Grossly normal.  Neck: Supple, no JVD, carotid bruits, or masses. Cardiac: RRR, no murmurs, rubs, or  gallops. No clubbing, cyanosis, edema.  Radials/DP/PT 2+ and equal bilaterally.  Respiratory:  Coarse breath sounds right base. GI: Soft, nontender, nondistended, BS + x 4. MS: no deformity or atrophy. Skin: warm and dry, no rash. Neuro:  Strength and sensation are intact. Psych: AAOx3.  Normal affect.  Labs    CBC  Recent Labs  07/11/17 0411  WBC 7.0  HGB 16.8  HCT 47.6  MCV 86.8  PLT 173   Basic Metabolic Panel  Recent Labs  07/11/17 0411  NA 137  K 3.5  CL 103  CO2 27  GLUCOSE 195*  BUN 11  CREATININE 0.75  CALCIUM 8.9   Liver Function Tests No results for input(s): AST, ALT, ALKPHOS, BILITOT, PROT, ALBUMIN in the last 72 hours. No results for input(s): LIPASE, AMYLASE in the last 72 hours. Cardiac Enzymes  Recent Labs  07/11/17 0411  TROPONINI <0.03   BNP Invalid input(s): POCBNP D-Dimer No results for input(s): DDIMER in the last 72 hours. Hemoglobin A1C  No results for input(s): HGBA1C in the last 72 hours. Fasting Lipid Panel No results for input(s): CHOL, HDL, LDLCALC, TRIG, CHOLHDL, LDLDIRECT in the last 72 hours. Thyroid Function Tests No results for input(s): TSH, T4TOTAL, T3FREE, THYROIDAB in the last 72 hours.  Invalid input(s): FREET3  Telemetry    NSR, 70s bpm - Personally Reviewed  ECG    n/a - Personally Reviewed  Radiology    Dg Chest Port 1 View  Result Date: 07/11/2017 IMPRESSION: Mild right perihilar atelectasis. Electronically Signed   By: Rubye Oaks M.D.   On: 07/11/2017 04:44    Cardiac Studies   LHC 07/11/2017: Conclusion     Prox Cx lesion, 30 %stenosed.  Prox RCA lesion, 30 %stenosed.  Mid RCA to Dist RCA lesion, 30 %stenosed.  There is moderate to severe left ventricular systolic dysfunction.  LV end diastolic pressure is moderately elevated.  There is trivial (1+) mitral regurgitation.   1. Mild nonobstructive coronary artery disease. 2. Moderately to severely reduced LV systolic function  with an EF of 30-35% likely due to hypertensive heart disease. 3. Moderately elevated left ventricular end-diastolic pressure. 4. Aortic root angiography showed no evidence of ascending aortic aneurysm or dissection.  Recommendations: I suspect that the patient's symptoms are likely due to acute systolic heart failure due to hypertensive heart disease with new left bundle branch block. There is no culprit for unstable angina. We need to start him on heart failure medications. I initiated treatment with carvedilol and Entresto (the patient tolerated losartan in the past). Gentle diuresis with oral furosemide. Smoking cessation is advised.     TTE 07/11/2017: Study Conclusions  - Procedure narrative: Transthoracic echocardiography. Image   quality was poor. The study was technically difficult, as a   result of poor acoustic windows and poor sound wave transmission.   Intravenous contrast (Definity) was administered. - Left ventricle: The cavity size was mildly dilated. There was   mild concentric hypertrophy. Systolic function was moderately   reduced. The estimated ejection fraction was in the range of 35%   to 40%. Diffuse hypokinesis. Features are consistent with a   pseudonormal left ventricular filling pattern, with concomitant   abnormal relaxation and increased filling pressure (grade 2   diastolic dysfunction). - Left atrium: The atrium was mildly dilated.  Patient Profile     46 y.o. male with history of poorly controlled HTN, noncompliance, ongoing tobacco abuse at 1 pack daily since age 56, and DM who presented to [facility] with chest tightness and LBBB. Cardiac cath showed nonobstructive disease with EF 30-35% and hypertensive heart disease.  Assessment & Plan    1. Acute systolic CHF/chest tightness/new LBBB: -Emergent LHC on 8/26 showed nonobstructive CAD -Symptoms felt to be 2/2 acute systolic CHF and hypertensive heart disease -Titrate Coreg to 12.5 mg bid -Continue  Entresto 24/26 mg bid -Plan to add spironolactone today if renal function and potassium are normal -TTE as above -Will need to optimize medications followed by repeat limited TTE as an outpatient to evaluate for improved EF -Medication and dietary compliance is advised -Daily weights, strict Is and Os -CHF education  2. Hypertensive heart disease: -Improving -Titrate Coreg as above -Continue Entresto -Plan to add spironolactone today if renal function and potassium are ok -Compliance advised  3. Ongoing tobacco abuse: -Cessation advised -He is interested in patches  4. DM2: -Hold metformin 48 hours s/p LHC  Signed, Eula Listen, PA-C [facility] Pager: [phone] 07/12/2017, 8:04 AM

## 2017-07-12 NOTE — Progress Notes (Signed)
IV and tele removed from patient. Discharge instructions given to patient along with hard copy prescriptions. Verbalized understanding. No distress at this time. Wife is at bedside and will be transporting patient home.

## 2017-07-12 NOTE — Progress Notes (Addendum)
I contacted patient via telephone,  as he was discharged from the hospital this a.m. before I could see him.  Explained cardiac and pulmonary rehab programs to patient.  Confirmed patient's insurance which is [employer].   Explained to patient he is a candidate for cardiac rehab due to his dx of Chronic Systolic HF. Echo performed on 07/11/2017 with resulting EF of 35 - 40%.  Note:  Patient continues to smoke.  He has been smoking 1 pack/day since age 63.  Reviewed with patient the Cardiac Rehab class schedule compared to his work schedule.  Patient's work schedule is Monday  - Friday 1 p.m. until he is finished.  Morning classes would work for him.  I explained I would be reaching out to Dr. Kirke Corin to obtain a referral to Cardiac Rehab.  Once we have the order then we will contact him to get him enrolled for an Orientation appointment.  Patient agreed with plan and thanked me for calling.    Army Melia, RN, BSN, [facility] Cardiovascular and Pulmonary Nurse Navigator  11:30 - 11:50

## 2017-07-12 NOTE — Discharge Instructions (Signed)
Heart healthy and ADA diet. °

## 2017-07-12 NOTE — Telephone Encounter (Signed)
[facility] calling to schedule TCM   [facility] for MI s/p Cath Dr. Kirke Corin  Needs 1 week fu   Scheduled 9/6 with Arida  Added to wait list

## 2017-07-12 NOTE — Telephone Encounter (Signed)
Patient contacted regarding discharge from [facility] on 07/12/17.   Patient understands to follow up with provider ? On 07/22/17 at 0800am at [facility] with Dr Kirke Corin.  Patient understands discharge instructions? Yes  Patient understands medications and regiment? Yes Patient understands to bring all medications to this visit? Yes

## 2017-07-12 NOTE — Discharge Summary (Signed)
[facility]   PATIENT NAME: Keith [name]    MR#:  [id number]  DATE OF BIRTH:  1971/04/25  DATE OF ADMISSION:  07/11/2017   ADMITTING PHYSICIAN: Arnaldo Natal, MD  DATE OF DISCHARGE: 07/12/2017 10:37 AM  PRIMARY CARE PHYSICIAN: Donita Brooks, MD   ADMISSION DIAGNOSIS:  Anterior myocardial infarction (HCC) [I21.09] Acute systolic heart failure (HCC) [I50.21] DISCHARGE DIAGNOSIS:  Active Problems:   Unstable angina (HCC)   Acute systolic heart failure (HCC)   Other chest pain  SECONDARY DIAGNOSIS:   Past Medical History:  Diagnosis Date  . Coronary artery disease   . Diabetes mellitus   . Frequent headaches   . Hypertension    HOSPITAL COURSE:   1. Acute systolic CHF due to hypertension LV EF: 35% -   40%. Increased Coreg to 12.5 mg bid, Continue Entresto 24/26 mg bid, decreased Lasix to 40 mg by mouth daily and add spironolactone.  2. Chest pain with CAD LHC showed nonobstructive CAD. Continue aspirin and Lipitor.  3.  Hypertensive heart disease: Continue hypertension as mentioned above.  4. DM2. Metformin was on hold due to cardiac catheter. The patient was on sliding scale.   5. Tobacco abuse. Smoking cessation was counseled for 4 minutes.  I discussed with Dr. Kirke Corin. DISCHARGE CONDITIONS:  Stable, discharged to home today. CONSULTS OBTAINED:  Treatment Team:  [name], MD DRUG ALLERGIES:   Allergies  Allergen Reactions  . Other Other (See Comments)    Muscle Relaxers (psychosis)   DISCHARGE MEDICATIONS:   Allergies as of 07/12/2017      Reactions   Other Other (See Comments)   Muscle Relaxers (psychosis)      Medication List    STOP taking these medications   dexlansoprazole 60 MG capsule Commonly known as:  DEXILANT   losartan-hydrochlorothiazide 100-12.5 MG tablet Commonly known as:  HYZAAR     TAKE these medications   aspirin 81 MG chewable tablet Chew 1 tablet (81 mg  total) by mouth daily.   atorvastatin 40 MG tablet Commonly known as:  LIPITOR Take 1 tablet (40 mg total) by mouth daily at 6 PM.   carvedilol 12.5 MG tablet Commonly known as:  COREG Take 1 tablet (12.5 mg total) by mouth 2 (two) times daily with a meal.   furosemide 40 MG tablet Commonly known as:  LASIX Take 1 tablet (40 mg total) by mouth daily.   pantoprazole 40 MG tablet Commonly known as:  PROTONIX Take 1 tablet (40 mg total) by mouth daily.   sacubitril-valsartan 24-26 MG Commonly known as:  ENTRESTO Take 1 tablet by mouth 2 (two) times daily.   spironolactone 25 MG tablet Commonly known as:  ALDACTONE Take 1 tablet (25 mg total) by mouth daily.            Discharge Care Instructions        Start     Ordered   07/13/17 0000  aspirin 81 MG chewable tablet  Daily     07/12/17 0926   07/13/17 0000  furosemide (LASIX) 40 MG tablet  Daily     07/12/17 0926   07/12/17 0000  atorvastatin (LIPITOR) 40 MG tablet  Daily-1800     07/12/17 0926   07/12/17 0000  carvedilol (COREG) 12.5 MG tablet  2 times daily with meals     07/12/17 0926   07/12/17 0000  sacubitril-valsartan (ENTRESTO) 24-26 MG  2 times daily  07/12/17 0926   07/12/17 0000  spironolactone (ALDACTONE) 25 MG tablet  Daily     07/12/17 0926   07/12/17 0000  Increase activity slowly     07/12/17 0933   07/12/17 0000  Diet - low sodium heart healthy     07/12/17 0933       DISCHARGE INSTRUCTIONS:  See AVS.  If you experience worsening of your admission symptoms, develop shortness of breath, life threatening emergency, suicidal or homicidal thoughts you must seek medical attention immediately by calling 911 or calling your MD immediately  if symptoms less severe.  You Must read complete instructions/literature along with all the possible adverse reactions/side effects for all the Medicines you take and that have been prescribed to you. Take any new Medicines after you have completely understood  and accpet all the possible adverse reactions/side effects.   Please note  You were cared for by a hospitalist during your hospital stay. If you have any questions about your discharge medications or the care you received while you were in the hospital after you are discharged, you can call the unit and asked to speak with the hospitalist on call if the hospitalist that took care of you is not available. Once you are discharged, your primary care physician will handle any further medical issues. Please note that NO REFILLS for any discharge medications will be authorized once you are discharged, as it is imperative that you return to your primary care physician (or establish a relationship with a primary care physician if you do not have one) for your aftercare needs so that they can reassess your need for medications and monitor your lab values.    On the day of Discharge:  VITAL SIGNS:  Blood pressure (!) 146/74, pulse 62, temperature 97.6 F (36.4 C), temperature source Oral, resp. rate 19, height 5\' 11"  (1.803 m), weight 210 lb 12.2 oz (95.6 kg), SpO2 96 %. PHYSICAL EXAMINATION:  GENERAL:  46 y.o.-year-old patient lying in the bed with no acute distress.  EYES: Pupils equal, round, reactive to light and accommodation. No scleral icterus. Extraocular muscles intact.  HEENT: Head atraumatic, normocephalic. Oropharynx and nasopharynx clear.  NECK:  Supple, no jugular venous distention. No thyroid enlargement, no tenderness.  LUNGS: Normal breath sounds bilaterally, no wheezing, rales,rhonchi or crepitation. No use of accessory muscles of respiration.  CARDIOVASCULAR: S1, S2 normal. No murmurs, rubs, or gallops.  ABDOMEN: Soft, non-tender, non-distended. Bowel sounds present. No organomegaly or mass.  EXTREMITIES: No pedal edema, cyanosis, or clubbing.  NEUROLOGIC: Cranial nerves II through XII are intact. Muscle strength 5/5 in all extremities. Sensation intact. Gait not checked.  PSYCHIATRIC:  The patient is alert and oriented x 3.  SKIN: No obvious rash, lesion, or ulcer.  DATA REVIEW:   CBC  Recent Labs Lab 07/11/17 0411  WBC 7.0  HGB 16.8  HCT 47.6  PLT 173    Chemistries   Recent Labs Lab 07/12/17 0852  NA 138  K 3.5  CL 103  CO2 25  GLUCOSE 250*  BUN 14  CREATININE 0.72  CALCIUM 8.6*     Microbiology Results  No results found for this or any previous visit.  RADIOLOGY:  No results found.   Management plans discussed with the patient, his wife and they are in agreement.  CODE STATUS: Prior   TOTAL TIME TAKING CARE OF THIS PATIENT: 35 minutes.    [name] M.D on 07/12/2017 at 4:05 PM  Between 7am to 6pm - Pager -  [phone]  After 6pm go to [email or web address] - Social research officer, government  [facility]  Office  [phone]  CC: Primary care physician; Donita Brooks, MD   Note: This dictation was prepared with Dragon dictation along with smaller phrase technology. Any transcriptional errors that result from this process are unintentional.

## 2017-07-14 ENCOUNTER — Telehealth: Payer: Self-pay | Admitting: Family Medicine

## 2017-07-14 NOTE — Telephone Encounter (Signed)
Was able to reach patient.  Reviewed discharge summary and medication list.  Pt say he feels well.  Confirmed his follow up appt with Cardiology and here with Dr Tanya Nones.  Told to call if has any questions or problems before appts

## 2017-07-14 NOTE — Telephone Encounter (Signed)
Tried to call patient to check on him post hosp.  No answer, left message.  He does have follow up appt for 07/26/17 with Dr Tanya Nones.

## 2017-07-20 ENCOUNTER — Telehealth: Payer: Self-pay | Admitting: Cardiovascular Disease

## 2017-07-20 NOTE — Telephone Encounter (Signed)
Patient dropped off fmla forms.  Sent interoffice mail to [employer] with signed ROI and $25 money order .

## 2017-07-22 ENCOUNTER — Ambulatory Visit (INDEPENDENT_AMBULATORY_CARE_PROVIDER_SITE_OTHER): Payer: Managed Care, Other (non HMO) | Admitting: Cardiovascular Disease

## 2017-07-22 ENCOUNTER — Other Ambulatory Visit: Payer: Self-pay | Admitting: *Deleted

## 2017-07-22 ENCOUNTER — Ambulatory Visit: Payer: Managed Care, Other (non HMO) | Admitting: Cardiovascular Disease

## 2017-07-22 ENCOUNTER — Encounter: Payer: Self-pay | Admitting: Cardiovascular Disease

## 2017-07-22 ENCOUNTER — Telehealth: Payer: Self-pay | Admitting: *Deleted

## 2017-07-22 VITALS — BP 169/95 | HR 76 | Ht 71.0 in | Wt 211.2 lb

## 2017-07-22 DIAGNOSIS — I160 Hypertensive urgency: Secondary | ICD-10-CM

## 2017-07-22 DIAGNOSIS — E118 Type 2 diabetes mellitus with unspecified complications: Secondary | ICD-10-CM | POA: Diagnosis not present

## 2017-07-22 DIAGNOSIS — I420 Dilated cardiomyopathy: Secondary | ICD-10-CM | POA: Diagnosis not present

## 2017-07-22 DIAGNOSIS — I25118 Atherosclerotic heart disease of native coronary artery with other forms of angina pectoris: Secondary | ICD-10-CM | POA: Diagnosis not present

## 2017-07-22 DIAGNOSIS — I5021 Acute systolic (congestive) heart failure: Secondary | ICD-10-CM

## 2017-07-22 MED ORDER — FUROSEMIDE 40 MG PO TABS: 40.0000 mg | ORAL_TABLET | Freq: Every day | ORAL | 3 refills | Status: DC

## 2017-07-22 MED ORDER — CARVEDILOL 25 MG PO TABS: 25.0000 mg | ORAL_TABLET | Freq: Two times a day (BID) | ORAL | 3 refills | Status: DC

## 2017-07-22 MED ORDER — OMEPRAZOLE 20 MG PO TBEC: 2.0000 | DELAYED_RELEASE_TABLET | Freq: Every day | ORAL | Status: DC

## 2017-07-22 MED ORDER — SPIRONOLACTONE 25 MG PO TABS: 25.0000 mg | ORAL_TABLET | Freq: Every day | ORAL | 3 refills | Status: DC

## 2017-07-22 MED ORDER — POTASSIUM CHLORIDE CRYS ER 20 MEQ PO TBCR: 20.0000 meq | EXTENDED_RELEASE_TABLET | Freq: Every day | ORAL | 4 refills | Status: DC

## 2017-07-22 MED ORDER — PANTOPRAZOLE SODIUM 40 MG PO TBEC: 40.0000 mg | DELAYED_RELEASE_TABLET | Freq: Every day | ORAL | 4 refills | Status: DC

## 2017-07-22 MED ORDER — LOSARTAN POTASSIUM 100 MG PO TABS: 100.0000 mg | ORAL_TABLET | Freq: Every day | ORAL | 4 refills | Status: DC

## 2017-07-22 MED ORDER — ATORVASTATIN CALCIUM 40 MG PO TABS: 40.0000 mg | ORAL_TABLET | Freq: Every day | ORAL | 4 refills | Status: DC

## 2017-07-22 NOTE — Progress Notes (Signed)
Cardiology Office Note  Date:  07/22/2017   ID:  Keith [name], DOB [birth year], MRN [id number]  PCP:  Donita Brooks, MD   Chief Complaint  Patient presents with  . other    F/u cardiac cath. Pt not taking Entresto due to cost. Meds reviewed verbally with pt.    HPI:  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 Reports it was $500.  Suspect this was the price before approval from insurance  EKG personally reviewed by myself on todays visit Shows normal sinus rhythm rate 76 bpm left bundle branch block  Hospital records reviewed with him as below  LHC 07/11/2017: Conclusion     Prox Cx lesion, 30 %stenosed.  Prox RCA lesion, 30 %stenosed.  Mid RCA to Dist RCA lesion, 30 %stenosed.  There is moderate to severe left ventricular systolic dysfunction.  LV end diastolic pressure is moderately elevated.  There is trivial (1+) mitral regurgitation.  1. Mild nonobstructive coronary artery disease. 2. Moderately to severely reduced LV systolic function with an EF of 30-35% likely due to hypertensive heart disease. 3. Moderately elevated left ventricular end-diastolic pressure. 4. Aortic root angiography showed no evidence of ascending aortic aneurysm or dissection.  Recommendations:  acute systolic heart failure due to hypertensive heart disease with new left bundle branch block.       TTE 07/11/2017: Study Conclusions  - Procedure narrative:  Transthoracic echocardiography. Image quality was poor. The study was technically difficult, as a result of poor acoustic windows and poor sound wave transmission. Intravenous contrast (Definity) was administered. - Left ventricle: The cavity size was mildly dilated. There was mild concentric hypertrophy. Systolic function was moderately reduced. The estimated ejection fraction was in the range of 35% to 40%. Diffuse hypokinesis. Features are consistent with a pseudonormal left ventricular filling pattern, with concomitant abnormal relaxation and increased filling pressure (grade 2 diastolic dysfunction). - Left atrium: The atrium was mildly dilated.  PMH:   has a past medical history of Coronary artery disease; Diabetes mellitus; Frequent headaches; and Hypertension.  PSH:    Past Surgical History:  Procedure Laterality Date  . COLONOSCOPY WITH PROPOFOL N/A 02/19/2017   Procedure: COLONOSCOPY WITH PROPOFOL;  Surgeon: [name], MD;  Location: [facility] ENDOSCOPY;  Service: Endoscopy;  Laterality: N/A;  . ESOPHAGOGASTRODUODENOSCOPY (EGD) WITH PROPOFOL N/A 02/19/2017   Procedure: ESOPHAGOGASTRODUODENOSCOPY (EGD) WITH PROPOFOL;  Surgeon: [name], MD;  Location: [facility] ENDOSCOPY;  Service: Endoscopy;  Laterality: N/A;  . LEFT HEART CATH AND CORONARY ANGIOGRAPHY N/A 07/11/2017   Procedure: LEFT HEART CATH AND CORONARY ANGIOGRAPHY;  Surgeon: [name], MD;  Location: [facility] INVASIVE CV LAB;  Service: Cardiovascular;  Laterality: N/A;    Current Outpatient Prescriptions  Medication Sig Dispense Refill  . aspirin 81 MG chewable tablet Chew 1 tablet (81 mg total) by mouth daily. 30 tablet 2  . atorvastatin (LIPITOR) 40 MG tablet Take 1 tablet (40 mg total) by mouth daily at 6 PM. 90 tablet 4  . carvedilol (COREG) 25 MG tablet Take 1 tablet (25 mg total) by mouth 2 (  two) times daily with a meal. 180 tablet 3  . furosemide (LASIX) 40 MG tablet Take 1 tablet (40 mg total) by mouth  daily. 90 tablet 3  . pantoprazole (PROTONIX) 40 MG tablet Take 1 tablet (40 mg total) by mouth daily. 90 tablet 4  . spironolactone (ALDACTONE) 25 MG tablet Take 1 tablet (25 mg total) by mouth daily. 90 tablet 3  . losartan (COZAAR) 100 MG tablet Take 1 tablet (100 mg total) by mouth daily. 90 tablet 4  . potassium chloride SA (K-DUR,KLOR-CON) 20 MEQ tablet Take 1 tablet (20 mEq total) by mouth daily. 90 tablet 4   No current facility-administered medications for this visit.      Allergies:   Other   Social History:  The patient  reports that he has been smoking Cigarettes.  He has been smoking about 0.25 packs per day. He has never used smokeless tobacco. He reports that he does not drink alcohol or use drugs.   Family History:   family history includes Hypertension in his father and mother.    Review of Systems: Review of Systems  Constitutional: Negative.   Respiratory: Negative.   Cardiovascular: Negative.   Gastrointestinal: Negative.   Musculoskeletal: Negative.   Neurological: Negative.   Psychiatric/Behavioral: Negative.   All other systems reviewed and are negative.    PHYSICAL EXAM: VS:  BP (!) 169/95 (BP Location: Left Arm, Patient Position: Sitting, Cuff Size: Normal)   Pulse 76   Ht 5\' 11"  (1.803 m)   Wt 211 lb 4 oz (95.8 kg)   BMI 29.46 kg/m  , BMI Body mass index is 29.46 kg/m. GEN: Well nourished, well developed, in no acute distress  HEENT: normal  Neck: no JVD, carotid bruits, or masses Cardiac: RRR; no murmurs, rubs, or gallops,no edema  Respiratory:  clear to auscultation bilaterally, normal work of breathing GI: soft, nontender, nondistended, + BS MS: no deformity or atrophy  Skin: warm and dry, no rash Neuro:  Strength and sensation are intact Psych: euthymic mood, full affect    Recent Labs: 12/14/2016: ALT 10 01/20/2017: TSH 2.774 07/11/2017: Hemoglobin 16.8; Platelets 173 07/12/2017: BUN 14; Creatinine, Ser 0.72; Potassium 3.5; Sodium 138     Lipid Panel Lab Results  Component Value Date   CHOL 168 01/24/2015   HDL 34 (L) 01/24/2015   LDLCALC 109 (H) 01/24/2015   TRIG 125 01/24/2015      Wt Readings from Last 3 Encounters:  07/22/17 211 lb 4 oz (95.8 kg)  07/11/17 210 lb 12.2 oz (95.6 kg)  02/19/17 174 lb (78.9 kg)       ASSESSMENT AND PLAN:  Dilated cardiomyopathy (HCC) - Plan: EKG 12-Lead, ECHOCARDIOGRAM COMPLETE  Etiology of cardiomyopathy likely secondary to hypertensive heart disease We'll work on his blood pressure as detailed below  Acute systolic heart failure (HCC) Appears relatively euvolemic If he does get bloating, weight gain, recommended he take extra Lasix after lunch We will add potassium 20 mEq every other day Potassium has been running low for the past several years Consider repeat echocardiogram 3 months  Hypertensive urgency Blood pressure continues to run high Unable to afford entresto Recommended he start losartan 100 mg daily. Discussed medication with him in detail. Also will increase carvedilol up to 25 mill grams twice a day Continue Aldactone  Coronary artery disease of native artery of native heart with stable angina pectoris (HCC) Currently with no symptoms of angina. No further workup at this time. Continue current medication regimen.  DM (diabetes mellitus) with complications (HCC) We have encouraged continued exercise, careful diet management in an effort to lose weight.   Disposition:   F/U  3 months with Dr. Kirke Corin   Total encounter time more than 45 minutes  Greater than 50% was spent in counseling and coordination of care with the patient    Orders Placed This Encounter  Procedures  . EKG 12-Lead  . ECHOCARDIOGRAM COMPLETE     Signed, Dossie Arbour, M.D., Ph.D. 07/22/2017  [facility] [place], [place] [phone]

## 2017-07-22 NOTE — Patient Instructions (Addendum)
Medication Instructions:   Please increase the coreg up to 25 mg twice a day  Please start losartan one a day  Please take extra furosemide as needed after lunch for bloating/SOB  Monitor Blood pressure  Labwork:  No new labs needed  Testing/Procedures:  Echo in 3 months for cardiomyopathy   Follow-Up: It was a pleasure seeing you in the office today. Please call us if you have new issues that need to be addressed before your next appt.  [phone]  Your physician wants you to follow-up in: 3 months with Dr. Katheren Puller will receive a reminder letter in the mail two months in advance. If you don't receive a letter, please call our office to schedule the follow-up appointment.  If you need a refill on your cardiac medications before your next appointment, please call your pharmacy.

## 2017-07-22 NOTE — Telephone Encounter (Signed)
Patient saw Dr. Mariah Milling today in clinic who prescribed Protonix for patient.  Received notification from patient's pharmacy that it is not covered.  Dr. Mariah Milling made aware and advised for patient to get omeprazole 20 mg tablets, take 40 mg (2 tablets) by mouth once a day and if this works or does not work, he should contact his PCP for further refills and management. Patient verbalized understanding.

## 2017-07-26 ENCOUNTER — Inpatient Hospital Stay: Payer: Managed Care, Other (non HMO) | Admitting: Family Medicine

## 2017-07-27 ENCOUNTER — Inpatient Hospital Stay: Payer: Self-pay | Admitting: Family Medicine

## 2017-07-28 ENCOUNTER — Telehealth: Payer: Self-pay | Admitting: Cardiovascular Disease

## 2017-07-28 NOTE — Telephone Encounter (Signed)
Received records request [place] [employer] FML[employer], forwarded to [facility] for processing.

## 2017-08-09 ENCOUNTER — Telehealth: Payer: Self-pay | Admitting: Cardiovascular Disease

## 2017-08-09 NOTE — Telephone Encounter (Signed)
Received forms from [facility] for Dr. Kirke Corin to review and sign. Delivered to Mardene Celeste, RN

## 2017-08-09 NOTE — Telephone Encounter (Signed)
[employer] folder placed in Dr. Jari Sportsman in basket

## 2017-08-10 NOTE — Telephone Encounter (Signed)
[employer] folder given to Sabrina Gilley 

## 2017-08-10 NOTE — Telephone Encounter (Signed)
Sent interoffice completed [employer] forms

## 2017-08-13 ENCOUNTER — Telehealth: Payer: Self-pay | Admitting: Cardiovascular Disease

## 2017-08-13 NOTE — Telephone Encounter (Signed)
Clarification given for weight restrictions by Dr. Kirke Corin. Notified pt. Keith [name] faxed updated form to [place] at his employment.

## 2017-08-13 NOTE — Telephone Encounter (Signed)
Pt states he received the completed paperwork but is discrepancy regarding the weight he can lift. Please call.

## 2017-08-13 NOTE — Telephone Encounter (Signed)
Patient gave verbal authorization to speak with Inetta Fermo at his employment [employer] to advise her of status of [employer] paperwork

## 2017-09-24 ENCOUNTER — Emergency Department (HOSPITAL_COMMUNITY)
Admission: EM | Admit: 2017-09-24 | Discharge: 2017-09-24 | Disposition: A | Discharge status: Home / Self Care | Payer: Managed Care, Other (non HMO) | Attending: Emergency Medicine | Admitting: Emergency Medicine

## 2017-09-24 ENCOUNTER — Encounter (HOSPITAL_COMMUNITY): Payer: Self-pay

## 2017-09-24 ENCOUNTER — Emergency Department (HOSPITAL_COMMUNITY): Payer: Managed Care, Other (non HMO)

## 2017-09-24 DIAGNOSIS — I5023 Acute on chronic systolic (congestive) heart failure: Secondary | ICD-10-CM | POA: Diagnosis not present

## 2017-09-24 DIAGNOSIS — I2510 Atherosclerotic heart disease of native coronary artery without angina pectoris: Secondary | ICD-10-CM | POA: Diagnosis not present

## 2017-09-24 DIAGNOSIS — Z79899 Other long term (current) drug therapy: Secondary | ICD-10-CM | POA: Insufficient documentation

## 2017-09-24 DIAGNOSIS — R0602 Shortness of breath: Secondary | ICD-10-CM | POA: Diagnosis not present

## 2017-09-24 DIAGNOSIS — Z7982 Long term (current) use of aspirin: Secondary | ICD-10-CM | POA: Insufficient documentation

## 2017-09-24 DIAGNOSIS — E119 Type 2 diabetes mellitus without complications: Secondary | ICD-10-CM | POA: Diagnosis not present

## 2017-09-24 DIAGNOSIS — I110 Hypertensive heart disease with heart failure: Secondary | ICD-10-CM | POA: Diagnosis not present

## 2017-09-24 DIAGNOSIS — I10 Essential (primary) hypertension: Secondary | ICD-10-CM

## 2017-09-24 DIAGNOSIS — F17210 Nicotine dependence, cigarettes, uncomplicated: Secondary | ICD-10-CM | POA: Diagnosis not present

## 2017-09-24 DIAGNOSIS — R079 Chest pain, unspecified: Secondary | ICD-10-CM | POA: Diagnosis present

## 2017-09-24 DIAGNOSIS — Z8673 Personal history of transient ischemic attack (TIA), and cerebral infarction without residual deficits: Secondary | ICD-10-CM | POA: Insufficient documentation

## 2017-09-24 DIAGNOSIS — I509 Heart failure, unspecified: Secondary | ICD-10-CM

## 2017-09-24 LAB — HEPATIC FUNCTION PANEL
ALBUMIN: 3.8 g/dL (ref 3.5–5.0)
ALT: 25 U/L (ref 17–63)
AST: 26 U/L (ref 15–41)
Alkaline Phosphatase: 72 U/L (ref 38–126)
BILIRUBIN DIRECT: 0.2 mg/dL (ref 0.1–0.5)
Indirect Bilirubin: 0.9 mg/dL (ref 0.3–0.9)
TOTAL PROTEIN: 6.6 g/dL (ref 6.5–8.1)
Total Bilirubin: 1.1 mg/dL (ref 0.3–1.2)

## 2017-09-24 LAB — BASIC METABOLIC PANEL
Anion gap: 8 (ref 5–15)
BUN: 9 mg/dL (ref 6–20)
CO2: 24 mmol/L (ref 22–32)
CREATININE: 0.62 mg/dL (ref 0.61–1.24)
Calcium: 8.3 mg/dL — ABNORMAL LOW (ref 8.9–10.3)
Chloride: 102 mmol/L (ref 101–111)
Glucose, Bld: 220 mg/dL — ABNORMAL HIGH (ref 65–99)
POTASSIUM: 3.5 mmol/L (ref 3.5–5.1)
SODIUM: 134 mmol/L — AB (ref 135–145)

## 2017-09-24 LAB — CBC
HEMATOCRIT: 44.8 % (ref 39.0–52.0)
Hemoglobin: 15.4 g/dL (ref 13.0–17.0)
MCH: 30.4 pg (ref 26.0–34.0)
MCHC: 34.4 g/dL (ref 30.0–36.0)
MCV: 88.4 fL (ref 78.0–100.0)
PLATELETS: 178 10*3/uL (ref 150–400)
RBC: 5.07 MIL/uL (ref 4.22–5.81)
RDW: 13.3 % (ref 11.5–15.5)
WBC: 5.9 10*3/uL (ref 4.0–10.5)

## 2017-09-24 LAB — I-STAT TROPONIN, ED
Troponin i, poc: 0.01 ng/mL (ref 0.00–0.08)
Troponin i, poc: 0.02 ng/mL (ref 0.00–0.08)

## 2017-09-24 LAB — LIPASE, BLOOD: Lipase: 35 U/L (ref 11–51)

## 2017-09-24 LAB — BRAIN NATRIURETIC PEPTIDE: B Natriuretic Peptide: 154.8 pg/mL — ABNORMAL HIGH (ref 0.0–100.0)

## 2017-09-24 MED ORDER — FUROSEMIDE 10 MG/ML IJ SOLN
40.0000 mg | Freq: Once | INTRAMUSCULAR | Status: AC
  Administered 2017-09-24: 40 mg via INTRAVENOUS
  Filled 2017-09-24: qty 4

## 2017-09-24 MED ORDER — CARVEDILOL 12.5 MG PO TABS
25.0000 mg | ORAL_TABLET | Freq: Two times a day (BID) | ORAL | Status: DC
  Administered 2017-09-24: 25 mg via ORAL
  Filled 2017-09-24: qty 2

## 2017-09-24 MED ORDER — LOSARTAN POTASSIUM 50 MG PO TABS
100.0000 mg | ORAL_TABLET | Freq: Every day | ORAL | Status: DC
  Administered 2017-09-24: 100 mg via ORAL
  Filled 2017-09-24: qty 2

## 2017-09-24 NOTE — ED Triage Notes (Signed)
Per [facility], Pt is coming from work with complaints of CP two hours ago. Pt was having episodes throughout the day where he would get diaphoretic and dizzy. Chronic dry cough that has acutely gotten worse. Hx of the same two months ago with stent placement. Elevation noted in some of the leads. Given 324 mg of ASA, 1 Nitro, and 4 mg of Zofran.   18 Gauge in Left FA.   Vitals per EMS: 184/120, 90 HR, 90% on RA, 95% on 6L, 18 RR.

## 2017-09-24 NOTE — ED Notes (Signed)
Ambulated with pulse ox in hallway and pt reading was no lower than 91%. Denies SOB and CP and walked comfortably without assistance.

## 2017-09-24 NOTE — ED Notes (Signed)
ED Provider at bedside. 

## 2017-09-24 NOTE — ED Provider Notes (Signed)
[facility] Provider Note  CSN: [id number] Arrival date & time: 09/24/17  1633 History   Chief Complaint Chief Complaint  Patient presents with  . Chest Pain   HPI Keith [name] is a 46 y.o. male.  The history is provided by the patient.  Chest Pain   This is a new problem. The current episode started 3 to 5 hours ago. The problem occurs constantly. The problem has been gradually improving. The pain is associated with walking. The pain is present in the substernal region. The pain is mild. The quality of the pain is described as pressure-like. The pain does not radiate. Duration of episode(s) is 3 hours. Associated symptoms include shortness of breath. Pertinent negatives include no abdominal pain, no back pain, no cough, no fever, no palpitations and no vomiting. Risk factors include lack of exercise, male gender and smoking/tobacco exposure.  His past medical history is significant for CAD, CHF, diabetes, hypertension and MI.  Pertinent negatives for past medical history include no seizures.   Past Medical History:  Diagnosis Date  . Coronary artery disease   . Diabetes mellitus   . Frequent headaches   . Hypertension    Patient Active Problem List   Diagnosis Date Noted  . Acute systolic heart failure (HCC) 07/11/2017  . Unstable angina (HCC)   . Other chest pain   . Migraine aura, persistent, with status migrainosus   . Hypertensive urgency 01/23/2015  . Slurred speech 01/23/2015  . Headache 01/23/2015  . DM (diabetes mellitus) with complications (HCC) 01/23/2015  . TIA (transient ischemic attack) 01/23/2015   History reviewed. No pertinent surgical history.  Home Medications    Prior to Admission medications   Medication Sig Start Date End Date Taking? Authorizing Provider  aspirin 81 MG chewable tablet Chew 1 tablet (81 mg total) by mouth daily. 07/13/17   Shaune Pollackhen, Qing, MD  atorvastatin (LIPITOR) 40 MG tablet Take 1 tablet (40  mg total) by mouth daily at 6 PM. 07/22/17   Gollan, Tollie Pizzaimothy J, MD  carvedilol (COREG) 25 MG tablet Take 1 tablet (25 mg total) by mouth 2 (two) times daily with a meal. 07/22/17   Gollan, Tollie Pizzaimothy J, MD  furosemide (LASIX) 40 MG tablet Take 1 tablet (40 mg total) by mouth daily. 07/22/17   Antonieta IbaGollan, Timothy J, MD  losartan (COZAAR) 100 MG tablet Take 1 tablet (100 mg total) by mouth daily. 07/22/17   Antonieta IbaGollan, Timothy J, MD  Omeprazole 20 MG TBEC Take 2 tablets (40 mg total) by mouth daily. 07/22/17   Antonieta IbaGollan, Timothy J, MD  potassium chloride SA (K-DUR,KLOR-CON) 20 MEQ tablet Take 1 tablet (20 mEq total) by mouth daily. 07/22/17   Antonieta IbaGollan, Timothy J, MD  spironolactone (ALDACTONE) 25 MG tablet Take 1 tablet (25 mg total) by mouth daily. 07/22/17   Antonieta IbaGollan, Timothy J, MD    Family History Family History  Problem Relation Age of Onset  . Hypertension Mother   . Hypertension Father     Social History Social History   Tobacco Use  . Smoking status: Current Every Day Smoker    Packs/day: 0.50    Types: Cigarettes  . Smokeless tobacco: Never Used  . Tobacco comment: Pt currently smoking around 10 cigs daily.  Substance Use Topics  . Alcohol use: Yes    Comment: Occasionally   . Drug use: No   Allergies   Other   Review of Systems Review of Systems  Constitutional: Negative for chills and  fever.  HENT: Negative for ear pain and sore throat.   Eyes: Negative for pain and visual disturbance.  Respiratory: Positive for chest tightness and shortness of breath. Negative for cough.   Cardiovascular: Positive for chest pain. Negative for palpitations.  Gastrointestinal: Negative for abdominal pain and vomiting.  Genitourinary: Negative for dysuria and hematuria.  Musculoskeletal: Negative for arthralgias and back pain.  Skin: Negative for color change and rash.  Neurological: Negative for seizures and syncope.  All other systems reviewed and are negative.  Physical Exam Updated Vital Signs BP (!)  150/91   Pulse 71   Temp 98.4 F (36.9 C) (Oral)   Resp 18   Ht 5\' 11"  (1.803 m)   Wt 90.7 kg (200 lb)   SpO2 93%   BMI 27.89 kg/m   Physical Exam  Constitutional: He appears well-developed and well-nourished.  HENT:  Head: Normocephalic and atraumatic.  Eyes: Conjunctivae and EOM are normal. Pupils are equal, round, and reactive to light.  Neck: Neck supple.  Cardiovascular: Normal rate, regular rhythm and intact distal pulses.  No murmur heard. Pulmonary/Chest: Effort normal. No respiratory distress.  Minimal crackles at the bases  Abdominal: Soft. There is no tenderness.  Musculoskeletal: Normal range of motion.       Right lower leg: He exhibits no tenderness and no edema.       Left lower leg: He exhibits no tenderness and no edema.  Neurological: He is alert.  Skin: Skin is warm and dry.  Psychiatric: He has a normal mood and affect.  Nursing note and vitals reviewed.  ED Treatments / Results  Labs (all labs ordered are listed, but only abnormal results are displayed) Labs Reviewed  BASIC METABOLIC PANEL - Abnormal; Notable for the following components:      Result Value   Sodium 134 (*)    Glucose, Bld 220 (*)    Calcium 8.3 (*)    All other components within normal limits  CBC  HEPATIC FUNCTION PANEL  LIPASE, BLOOD  I-STAT TROPONIN, ED    EKG  EKG Interpretation  Date/Time:  Friday September 24 2017 16:39:34 EST Ventricular Rate:  81 PR Interval:    QRS Duration: 169 QT Interval:  454 QTC Calculation: 528 R Axis:   34 Text Interpretation:  Sinus rhythm Probable left atrial enlargement Left bundle branch block Baseline wander in lead(s) V1 V2 LBBB similar to previous, some EKG changes since prior Confirmed by Richardean Canal [phone]) on 09/24/2017 5:05:03 PM      Radiology Dg Chest 2 View  Result Date: 09/24/2017 CLINICAL DATA:  Per [facility], Pt is coming from work with complaints of mid CP two hours ago. Pt was having episodes throughout the day where he  would get diaphoretic and dizzy. Chronic dry cough that has acutely gotten worse. Hx of the same two m.*comment was truncated* EXAM: CHEST  2 VIEW COMPARISON:  07/11/2017 FINDINGS: Normal cardiac silhouette. There is fine interstitial pattern suggesting interstitial edema. No pleural fluid. No pneumothorax. Aorta is ectatic. IMPRESSION: Interstitial edema pattern, increased from comparison exam. Electronically Signed   By: Genevive Bi M.D.   On: 09/24/2017 17:32   Procedures Procedures (including critical care time)  Medications Ordered in ED Medications - No data to display  Initial Impression / Assessment and Plan / ED Course  I have reviewed the triage vital signs and the nursing notes.  Pertinent labs & imaging results that were available during my care of the patient were reviewed  by me and considered in my medical decision making (see chart for details).  Keith [name] is a 46 y.o. male with PMH of HTN, CHF, and MI who presents with shortness of breath and chest pain.   EKG I obtained reveals no anatomical ischemia representing STEMI, New-onset Arrhythmia, or ischemic equivalent. Therefore do not suspect ACS at this time. No concerns for Pericardial Tamponade on EKG and in light of patients hemodynamic stability. No pain related to supine or prone positions and given EKG doubt Pericarditis.   CXR unremarkable for focal airspace disease, patient is afebrile,  cough, and WBC shows no leukocytosis, do not suspect Pneumonia. Without evidence of Pneumothorax. CXR without concern for Esophageal Tear and there is no recent intractable emesis or esophageal instrumentation.  No peritonitis or free air on CXR worrisome for Perforated Abdominal Viscous.  CXR: mild pulmonary edema Labs: troponin neg x2,CBC wnl, BMP remarkable for hyperglycemia 220, BNP 154  Unlikely Pulmonary Embolism as patient denies estrogen supplementation.  Age <50 years. (46 y.o.). Denies malignancy with treatment in  last 6 months. No previous Hx of DVT/PE. Patient denies hemoptysis. No unilateral leg swelling observed on exam. Oxygenation saturation has been maintained >95% & HR has been <100 since since arrival to the ED. No recent surgery or trauma to lower extremities or travel involving prolonged car or plane ride. Therefore will not obtain CTA Chest or D-dimer.  Pain is not described as tearing and does not radiate to back, doubt Aortic Dissection. Pulses present bilaterally in upper and lower extremities. CXR does not show widened mediastinum.  At this time patient symptoms most likely due to mild CHF exacerbation due to poorly controlled hypertension and medication non-compliance as he has not taken any of his medication for the past 4-5 days.  Patient given lasix and his home hypertension medications in the ED.  Symptoms resolved while in the ED.  At bedside, detailed discussion had stating that during an ED evaluation we cannot rule-out or guarantee with a high degree of certainty that the symptoms do not represent something serious such as a heart attack or some other serious medical condition. Patient was offered admission for further evaluation, but after lengthy discussion and consideration of the risks/benefits, the patient chooses to follow up as outpatient. The patient recently had cardiac cath 3 months ago that did not reveal any critical coronary artery stenosis. Counseled the patient at length regarding the importance of taking his medications to avoid future complications. He states his understanding and agrees to follow up with his PCP in 3-5 days. The patient states he does not need any refills and will pick up his prescriptions from the pharmacy tomorrow morning.   Final Clinical Impressions(s) / ED Diagnoses   Final diagnoses:  Chest pain, unspecified type  Shortness of breath  Hypertension, unspecified type  Acute on chronic congestive heart failure, unspecified heart failure type [facility])    ED Discharge Orders    None       Lamont Snowball, MD 09/25/17 1701    Charlynne Pander, MD 09/26/17 [phone]

## 2017-10-22 ENCOUNTER — Other Ambulatory Visit: Payer: Managed Care, Other (non HMO)

## 2017-10-22 ENCOUNTER — Ambulatory Visit: Payer: Managed Care, Other (non HMO) | Admitting: Cardiovascular Disease

## 2017-11-24 ENCOUNTER — Encounter: Payer: Self-pay | Admitting: Cardiovascular Disease

## 2018-02-11 ENCOUNTER — Ambulatory Visit: Payer: Managed Care, Other (non HMO) | Admitting: Physician Assistant

## 2018-02-11 ENCOUNTER — Encounter: Payer: Self-pay | Admitting: Physician Assistant

## 2018-02-11 VITALS — BP 160/102 | HR 88 | Temp 98.5°F | Resp 16 | Ht 71.0 in | Wt 225.0 lb

## 2018-02-11 DIAGNOSIS — E1159 Type 2 diabetes mellitus with other circulatory complications: Secondary | ICD-10-CM | POA: Diagnosis not present

## 2018-02-11 DIAGNOSIS — E118 Type 2 diabetes mellitus with unspecified complications: Secondary | ICD-10-CM

## 2018-02-11 DIAGNOSIS — I200 Unstable angina: Secondary | ICD-10-CM | POA: Diagnosis not present

## 2018-02-11 DIAGNOSIS — I10 Essential (primary) hypertension: Secondary | ICD-10-CM

## 2018-02-11 DIAGNOSIS — I152 Hypertension secondary to endocrine disorders: Secondary | ICD-10-CM

## 2018-02-11 DIAGNOSIS — R635 Abnormal weight gain: Secondary | ICD-10-CM

## 2018-02-11 DIAGNOSIS — I5021 Acute systolic (congestive) heart failure: Secondary | ICD-10-CM | POA: Diagnosis not present

## 2018-02-11 LAB — POCT GLYCOSYLATED HEMOGLOBIN (HGB A1C): Hemoglobin A1C: 9.2

## 2018-02-11 MED ORDER — CARVEDILOL 6.25 MG PO TABS: 6.2500 mg | ORAL_TABLET | Freq: Two times a day (BID) | ORAL | 3 refills | Status: DC

## 2018-02-11 NOTE — Progress Notes (Signed)
Patient: Keith [name] Male    DOB: 03/25/1971   47 y.o.   MRN: [id number] Visit Date: 02/11/2018  Today's Provider: [name], PA-C   Chief Complaint  Patient presents with  . Diabetes  . Hypertension   Subjective:    Keith [name] is a 47 y/o presenting today to establish care. He has a number of chronic illnesses.  Heart Failure He was hospitalized last year 07/11/2017 for chest pain. He saw Dr. Mariah Milling for follow up 07/2017. He had a cardiac cath that showed nonobstructive coronary disease. His echo showed 30-35% EF which was suspected to be due to uncontrolled HTN. He also displayed dilated cardiomyopathy with left ventricular enlargement. He was prescribed Entresto but did not pick this up because of cost. He was being treated for systolic heart failure with Lasix for weight gain. He was started on 100 mg Losartan daily and carvedilol was increased to 25 mg twice daily. He was instructed to continue aldactone. Recommended to have echo in 3 mo. Patient has not followed up since then.   Patient says he is only taking Lasix and Losartan. He says he is only taking the Lasix because he experienced bloating after eating which cause him to vomit. When he took lasix, he described increased urination and decrease in these symptoms so now he takes this. He reports his doctor told him that was the only two he was supposed to be taking. He is additionally not taking a statin, reports he never filled this medication.   Diabetes Patient has had diabetes for multiple years. He says he is confused whether or not he has diabetes because one doctor told him it was bad and then another doctor told him it was not so bad. He used to be on Metformin and Actos. Last year his A1c was 5.1. He is taking neither Metformin nor actos. He says he didn't feel like taking them anymore. He is not checking his sugars because he hates needles.  Erectile Dysfunction   He is also reporting inability  to sustain an erection.  Blackened Toe He reports black portion of toe. Wears steel toed boots and his shoes knock against his big toe.   Diabetes  He presents for his follow-up diabetic visit. He has type 2 diabetes mellitus. There are no hypoglycemic associated symptoms. Associated symptoms include foot ulcerations. Risk factors for coronary artery disease include diabetes mellitus, hypertension, male sex and tobacco exposure. When asked about current treatments, none were reported. His weight is increasing steadily. Meal planning includes carbohydrate counting. Home blood sugar record trend: Pt does not check blood sugars at home. An ACE inhibitor/angiotensin II receptor blocker is being taken. Eye exam is not current.  Hypertension  This is a chronic problem. Associated symptoms include shortness of breath. Risk factors for coronary artery disease include diabetes mellitus, male gender and smoking/tobacco exposure.  Breathing Problem  He complains of cough, difficulty breathing, shortness of breath and wheezing. The problem has been gradually worsening. The cough is non-productive. His symptoms are alleviated by nothing.       Allergies  Allergen Reactions  . Other Other (See Comments)    Muscle Relaxers (caused psychotic break)     Current Outpatient Medications:  .  aspirin 81 MG chewable tablet, Chew 1 tablet (81 mg total) by mouth daily., Disp: 30 tablet, Rfl: 2 .  furosemide (LASIX) 40 MG tablet, Take 1 tablet (40 mg total) by mouth daily., Disp: 90  tablet, Rfl: 3 .  losartan (COZAAR) 100 MG tablet, Take 1 tablet (100 mg total) by mouth daily., Disp: 90 tablet, Rfl: 4 .  Acetaminophen (TYLENOL PO), Take 250 mg 3 (three) times daily by mouth., Disp: , Rfl:  .  atorvastatin (LIPITOR) 40 MG tablet, Take 1 tablet (40 mg total) by mouth daily at 6 PM. (Patient not taking: Reported on 09/24/2017), Disp: 90 tablet, Rfl: 4 .  carvedilol (COREG) 25 MG tablet, Take 1 tablet (25 mg total) by  mouth 2 (two) times daily with a meal. (Patient not taking: Reported on 09/24/2017), Disp: 180 tablet, Rfl: 3 .  Omeprazole 20 MG TBEC, Take 2 tablets (40 mg total) by mouth daily. (Patient not taking: Reported on 09/24/2017), Disp: 30 each, Rfl:  .  potassium chloride SA (K-DUR,KLOR-CON) 20 MEQ tablet, Take 1 tablet (20 mEq total) by mouth daily. (Patient not taking: Reported on 09/24/2017), Disp: 90 tablet, Rfl: 4 .  spironolactone (ALDACTONE) 25 MG tablet, Take 1 tablet (25 mg total) by mouth daily. (Patient not taking: Reported on 09/24/2017), Disp: 90 tablet, Rfl: 3  Review of Systems  Constitutional: Positive for unexpected weight change.  Respiratory: Positive for cough, chest tightness, shortness of breath and wheezing.   Gastrointestinal: Positive for abdominal distention.    Social History   Tobacco Use  . Smoking status: Current Every Day Smoker    Packs/day: 1.00    Types: Cigarettes  . Smokeless tobacco: Never Used  Substance Use Topics  . Alcohol use: Yes    Comment: Occasionally    Objective:   BP (!) 160/102 (BP Location: Right Arm, Patient Position: Sitting, Cuff Size: Large)   Pulse 88   Temp 98.5 F (36.9 C) (Oral)   Resp 16   Ht 5\' 11"  (1.803 m)   Wt 225 lb (102.1 kg)   BMI 31.38 kg/m  Vitals:   02/11/18 1023  BP: (!) 160/102  Pulse: 88  Resp: 16  Temp: 98.5 F (36.9 C)  TempSrc: Oral  Weight: 225 lb (102.1 kg)  Height: 5\' 11"  (1.803 m)     Physical Exam  Constitutional: He is oriented to person, place, and time. He appears well-developed and well-nourished.  HENT:  Right Ear: External ear normal.  Left Ear: External ear normal.  Mouth/Throat: Oropharynx is clear and moist.  Eyes: Conjunctivae are normal.  Neck: Neck supple.  Cardiovascular: Normal rate and regular rhythm.  Pulmonary/Chest: Effort normal and breath sounds normal.  Abdominal: Soft. Bowel sounds are normal. He exhibits no distension and no ascites.  Neurological: He is alert and  oriented to person, place, and time.  Skin: Skin is warm and dry.  Psychiatric: He has a normal mood and affect. His behavior is normal.        Assessment & Plan:     1. DM (diabetes mellitus) with complications (HCC)  A1c is 9.2. I have clarified for patient that he indeed has diabetes and his diabetes was likely controlled at prior points, not cured. He does not have a meter so I am writing for one today. He is not currently on diabetic medications. I will check lab results to make sure his organ function supports Metformin and we will restart this. With heart disease and obesity, would be good candidate for either GLP1 or SGLT2 as second medication, which he will likely need. Will also need statin.  - Lipid Profile - CBC with Differential - Ambulatory referral to Ophthalmology - POCT HgB A1C  2. Unstable angina (  HCC)   3. Acute systolic heart failure (HCC)  Needs to follow up with cardiology. He is not compliant with medications, Dr. Windell Hummingbird note communicates he should be on lasix, losartan, aldactone, and carvedilol. Will restart carvedilol at dose below but he must follow up with them.  - Lipid Profile - Comprehensive Metabolic Panel (CMET) - CBC with Differential - carvedilol (COREG) 6.25 MG tablet; Take 1 tablet (6.25 mg total) by mouth 2 (two) times daily with a meal.  Dispense: 60 tablet; Refill: 3  4. Hypertension associated with diabetes (HCC)  - Lipid Profile - Comprehensive Metabolic Panel (CMET) - carvedilol (COREG) 6.25 MG tablet; Take 1 tablet (6.25 mg total) by mouth 2 (two) times daily with a meal.  Dispense: 60 tablet; Refill: 3  5. Weight gain  - TSH  Return in about 1 month (around 03/14/2018).  The entirety of the information documented in the History of Present Illness, Review of Systems and Physical Exam were personally obtained by me. Portions of this information were initially documented by Kavin Leech, CMA and reviewed by me for thoroughness and  accuracy.         [name], PA-C  [facility]

## 2018-02-11 NOTE — Patient Instructions (Signed)

## 2018-02-12 LAB — COMPREHENSIVE METABOLIC PANEL
ALT: 20 IU/L (ref 0–44)
AST: 22 IU/L (ref 0–40)
Albumin/Globulin Ratio: 1.8 (ref 1.2–2.2)
Albumin: 4.4 g/dL (ref 3.5–5.5)
Alkaline Phosphatase: 87 IU/L (ref 39–117)
BUN/Creatinine Ratio: 11 (ref 9–20)
BUN: 9 mg/dL (ref 6–24)
Bilirubin Total: 1.2 mg/dL (ref 0.0–1.2)
CO2: 28 mmol/L (ref 20–29)
Calcium: 9.8 mg/dL (ref 8.7–10.2)
Chloride: 97 mmol/L (ref 96–106)
Creatinine, Ser: 0.81 mg/dL (ref 0.76–1.27)
GFR calc Af Amer: 122 mL/min/{1.73_m2} (ref >59)
GFR calc non Af Amer: 106 mL/min/{1.73_m2} (ref >59)
Globulin, Total: 2.5 g/dL (ref 1.5–4.5)
Glucose: 227 mg/dL — ABNORMAL HIGH (ref 65–99)
Potassium: 4 mmol/L (ref 3.5–5.2)
Sodium: 138 mmol/L (ref 134–144)
Total Protein: 6.9 g/dL (ref 6.0–8.5)

## 2018-02-12 LAB — CBC WITH DIFFERENTIAL/PLATELET
Basophils Absolute: 0 10*3/uL (ref 0.0–0.2)
Basos: 0 %
EOS (ABSOLUTE): 0.2 10*3/uL (ref 0.0–0.4)
Eos: 3 %
Hematocrit: 46 % (ref 37.5–51.0)
Hemoglobin: 16.1 g/dL (ref 13.0–17.7)
Immature Grans (Abs): 0 10*3/uL (ref 0.0–0.1)
Immature Granulocytes: 0 %
Lymphocytes Absolute: 1.1 10*3/uL (ref 0.7–3.1)
Lymphs: 20 %
MCH: 29.8 pg (ref 26.6–33.0)
MCHC: 35 g/dL (ref 31.5–35.7)
MCV: 85 fL (ref 79–97)
Monocytes Absolute: 0.4 10*3/uL (ref 0.1–0.9)
Monocytes: 7 %
Neutrophils Absolute: 3.9 10*3/uL (ref 1.4–7.0)
Neutrophils: 70 %
Platelets: 186 10*3/uL (ref 150–379)
RBC: 5.4 x10E6/uL (ref 4.14–5.80)
RDW: 14 % (ref 12.3–15.4)
WBC: 5.6 10*3/uL (ref 3.4–10.8)

## 2018-02-12 LAB — LIPID PANEL
Chol/HDL Ratio: 5 ratio (ref 0.0–5.0)
Cholesterol, Total: 170 mg/dL (ref 100–199)
HDL: 34 mg/dL — ABNORMAL LOW (ref >39)
LDL Calculated: 99 mg/dL (ref 0–99)
Triglycerides: 183 mg/dL — ABNORMAL HIGH (ref 0–149)
VLDL Cholesterol Cal: 37 mg/dL (ref 5–40)

## 2018-02-12 LAB — TSH: TSH: 3.65 u[IU]/mL (ref 0.450–4.500)

## 2018-02-15 ENCOUNTER — Telehealth: Payer: Self-pay

## 2018-02-15 MED ORDER — METFORMIN HCL 500 MG PO TABS: ORAL_TABLET | ORAL | 3 refills | Status: DC

## 2018-02-15 NOTE — Telephone Encounter (Signed)
-----   Message from [name], [place] sent at 02/15/2018 10:14 AM EDT ----- Cholesterol is elevated for somebody with diabetes and we will need to restart the cholesterol medication, but we can consider this at next visit. Sugar is high, which is expected. Otherwise labwork is normal. Would like him to restart his Metformin, increasing dose over next 4 weeks and I will write this in the script. Would like him to be checking his sugars at least 3 times a week and bring in log to next visit. Please be sure he has scheduled follow up with cardiology.

## 2018-02-15 NOTE — Telephone Encounter (Signed)
Advised patient as below. Patient reports that he is currently out of cholesterol medication. Could we send this into [facility] on [address]? Patient also reports that he will start a log about his blood sugars. He also mentions that he has not had his appt with the Cardiologist yet but will try to get in this week.

## 2018-02-15 NOTE — Addendum Note (Signed)
Addended by: [name] on: 02/15/2018 10:16 AM   Modules accepted: Orders

## 2018-02-15 NOTE — Addendum Note (Signed)
Addended by: [name] on: 02/15/2018 01:03 PM   Modules accepted: Orders

## 2018-02-16 NOTE — Telephone Encounter (Signed)
Lowella Bandy called this patient, it has been documented in his most recent result note, please refer to that if needed.

## 2018-03-02 ENCOUNTER — Encounter: Payer: Self-pay | Admitting: Emergency Medicine

## 2018-03-02 ENCOUNTER — Telehealth: Payer: Self-pay

## 2018-03-02 ENCOUNTER — Other Ambulatory Visit: Payer: Self-pay

## 2018-03-02 ENCOUNTER — Emergency Department: Payer: Managed Care, Other (non HMO)

## 2018-03-02 ENCOUNTER — Observation Stay
Admission: EM | Admit: 2018-03-02 | Discharge: 2018-03-04 | Disposition: A | Discharge status: Home / Self Care | Payer: Managed Care, Other (non HMO) | Attending: Internal Medicine | Admitting: Internal Medicine

## 2018-03-02 DIAGNOSIS — Z888 Allergy status to other drugs, medicaments and biological substances status: Secondary | ICD-10-CM | POA: Diagnosis not present

## 2018-03-02 DIAGNOSIS — I2510 Atherosclerotic heart disease of native coronary artery without angina pectoris: Secondary | ICD-10-CM | POA: Diagnosis not present

## 2018-03-02 DIAGNOSIS — Z79899 Other long term (current) drug therapy: Secondary | ICD-10-CM | POA: Insufficient documentation

## 2018-03-02 DIAGNOSIS — E785 Hyperlipidemia, unspecified: Secondary | ICD-10-CM | POA: Diagnosis not present

## 2018-03-02 DIAGNOSIS — R4781 Slurred speech: Secondary | ICD-10-CM | POA: Diagnosis present

## 2018-03-02 DIAGNOSIS — I447 Left bundle-branch block, unspecified: Secondary | ICD-10-CM | POA: Insufficient documentation

## 2018-03-02 DIAGNOSIS — Z794 Long term (current) use of insulin: Secondary | ICD-10-CM | POA: Insufficient documentation

## 2018-03-02 DIAGNOSIS — R05 Cough: Secondary | ICD-10-CM

## 2018-03-02 DIAGNOSIS — Z833 Family history of diabetes mellitus: Secondary | ICD-10-CM | POA: Insufficient documentation

## 2018-03-02 DIAGNOSIS — I110 Hypertensive heart disease with heart failure: Secondary | ICD-10-CM | POA: Insufficient documentation

## 2018-03-02 DIAGNOSIS — Z7982 Long term (current) use of aspirin: Secondary | ICD-10-CM | POA: Insufficient documentation

## 2018-03-02 DIAGNOSIS — R059 Cough, unspecified: Secondary | ICD-10-CM

## 2018-03-02 DIAGNOSIS — F17210 Nicotine dependence, cigarettes, uncomplicated: Secondary | ICD-10-CM | POA: Diagnosis not present

## 2018-03-02 DIAGNOSIS — R51 Headache: Secondary | ICD-10-CM | POA: Diagnosis not present

## 2018-03-02 DIAGNOSIS — I5022 Chronic systolic (congestive) heart failure: Secondary | ICD-10-CM | POA: Diagnosis not present

## 2018-03-02 DIAGNOSIS — Z8249 Family history of ischemic heart disease and other diseases of the circulatory system: Secondary | ICD-10-CM | POA: Insufficient documentation

## 2018-03-02 DIAGNOSIS — G459 Transient cerebral ischemic attack, unspecified: Principal | ICD-10-CM

## 2018-03-02 DIAGNOSIS — E119 Type 2 diabetes mellitus without complications: Secondary | ICD-10-CM | POA: Insufficient documentation

## 2018-03-02 DIAGNOSIS — I340 Nonrheumatic mitral (valve) insufficiency: Secondary | ICD-10-CM | POA: Diagnosis not present

## 2018-03-02 LAB — GLUCOSE, CAPILLARY
GLUCOSE-CAPILLARY: 251 mg/dL — AB (ref 65–99)
GLUCOSE-CAPILLARY: 191 mg/dL — AB (ref 65–99)

## 2018-03-02 LAB — COMPREHENSIVE METABOLIC PANEL
ALT: 18 U/L (ref 17–63)
AST: 26 U/L (ref 15–41)
Albumin: 4.3 g/dL (ref 3.5–5.0)
Alkaline Phosphatase: 72 U/L (ref 38–126)
Anion gap: 8 (ref 5–15)
BILIRUBIN TOTAL: 1.3 mg/dL — AB (ref 0.3–1.2)
BUN: 15 mg/dL (ref 6–20)
CHLORIDE: 99 mmol/L — AB (ref 101–111)
CO2: 29 mmol/L (ref 22–32)
Calcium: 8.9 mg/dL (ref 8.9–10.3)
Creatinine, Ser: 0.89 mg/dL (ref 0.61–1.24)
Glucose, Bld: 236 mg/dL — ABNORMAL HIGH (ref 65–99)
Potassium: 3.4 mmol/L — ABNORMAL LOW (ref 3.5–5.1)
Sodium: 136 mmol/L (ref 135–145)
TOTAL PROTEIN: 7.6 g/dL (ref 6.5–8.1)

## 2018-03-02 LAB — PROTIME-INR
INR: 0.94
Prothrombin Time: 12.5 seconds (ref 11.4–15.2)

## 2018-03-02 LAB — DIFFERENTIAL
BASOS PCT: 0 %
Basophils Absolute: 0 10*3/uL (ref 0–0.1)
EOS ABS: 0.1 10*3/uL (ref 0–0.7)
Eosinophils Relative: 2 %
LYMPHS ABS: 0.7 10*3/uL — AB (ref 1.0–3.6)
Lymphocytes Relative: 13 %
MONO ABS: 0.4 10*3/uL (ref 0.2–1.0)
MONOS PCT: 7 %
NEUTROS ABS: 4.3 10*3/uL (ref 1.4–6.5)
Neutrophils Relative %: 78 %

## 2018-03-02 LAB — CBC
HEMATOCRIT: 44.1 % (ref 40.0–52.0)
Hemoglobin: 15.2 g/dL (ref 13.0–18.0)
MCH: 30.3 pg (ref 26.0–34.0)
MCHC: 34.4 g/dL (ref 32.0–36.0)
MCV: 87.9 fL (ref 80.0–100.0)
Platelets: 166 10*3/uL (ref 150–440)
RBC: 5.02 MIL/uL (ref 4.40–5.90)
RDW: 13.7 % (ref 11.5–14.5)
WBC: 5.5 10*3/uL (ref 3.8–10.6)

## 2018-03-02 LAB — APTT: aPTT: 34 seconds (ref 24–36)

## 2018-03-02 LAB — ETHANOL: Alcohol, Ethyl (B): <10 mg/dL (ref <10)

## 2018-03-02 LAB — TROPONIN I

## 2018-03-02 MED ORDER — SENNOSIDES-DOCUSATE SODIUM 8.6-50 MG PO TABS: 1.0000 | ORAL_TABLET | Freq: Every evening | ORAL | Status: DC | PRN

## 2018-03-02 MED ORDER — FUROSEMIDE 40 MG PO TABS
40.0000 mg | ORAL_TABLET | Freq: Every day | ORAL | Status: DC
  Administered 2018-03-02 – 2018-03-04 (×3): 40 mg via ORAL
  Filled 2018-03-02 (×3): qty 1

## 2018-03-02 MED ORDER — LORAZEPAM 2 MG/ML IJ SOLN
1.0000 mg | INTRAMUSCULAR | Status: AC
  Administered 2018-03-03: 1 mg via INTRAVENOUS
  Filled 2018-03-02: qty 1

## 2018-03-02 MED ORDER — GUAIFENESIN 100 MG/5ML PO SOLN
5.0000 mL | ORAL | Status: DC | PRN
  Administered 2018-03-02 – 2018-03-04 (×3): 100 mg via ORAL
  Filled 2018-03-02 (×2): qty 5
  Filled 2018-03-02: qty 10
  Filled 2018-03-02: qty 5

## 2018-03-02 MED ORDER — ATORVASTATIN CALCIUM 20 MG PO TABS
40.0000 mg | ORAL_TABLET | Freq: Every day | ORAL | Status: DC
  Administered 2018-03-03: 40 mg via ORAL
  Filled 2018-03-02: qty 2

## 2018-03-02 MED ORDER — ACETAMINOPHEN 325 MG PO TABS
650.0000 mg | ORAL_TABLET | ORAL | Status: DC | PRN
  Administered 2018-03-02 – 2018-03-04 (×6): 650 mg via ORAL
  Filled 2018-03-02 (×6): qty 2

## 2018-03-02 MED ORDER — INSULIN ASPART 100 UNIT/ML ~~LOC~~ SOLN
0.0000 [IU] | Freq: Three times a day (TID) | SUBCUTANEOUS | Status: DC
  Administered 2018-03-03: 17:00:00 2 [IU] via SUBCUTANEOUS
  Administered 2018-03-03 (×2): 3 [IU] via SUBCUTANEOUS
  Administered 2018-03-04: 09:00:00 2 [IU] via SUBCUTANEOUS
  Filled 2018-03-02 (×4): qty 1

## 2018-03-02 MED ORDER — ASPIRIN 81 MG PO CHEW
324.0000 mg | CHEWABLE_TABLET | Freq: Once | ORAL | Status: AC
Start: 2018-03-02 — End: 2018-03-02
  Administered 2018-03-02: 324 mg via ORAL
  Filled 2018-03-02: qty 4

## 2018-03-02 MED ORDER — ACETAMINOPHEN 650 MG RE SUPP: 650.0000 mg | RECTAL | Status: DC | PRN

## 2018-03-02 MED ORDER — ACETAMINOPHEN 160 MG/5ML PO SOLN: 650.0000 mg | ORAL | Status: DC | PRN

## 2018-03-02 MED ORDER — ENOXAPARIN SODIUM 40 MG/0.4ML ~~LOC~~ SOLN
40.0000 mg | SUBCUTANEOUS | Status: DC
  Filled 2018-03-02: qty 0.4

## 2018-03-02 MED ORDER — ASPIRIN EC 325 MG PO TBEC
325.0000 mg | DELAYED_RELEASE_TABLET | Freq: Every day | ORAL | Status: DC
  Administered 2018-03-03 – 2018-03-04 (×2): 325 mg via ORAL
  Filled 2018-03-02 (×2): qty 1

## 2018-03-02 MED ORDER — STROKE: EARLY STAGES OF RECOVERY BOOK
Freq: Once | Status: AC
  Administered 2018-03-02: 20:00:00

## 2018-03-02 MED ORDER — NICOTINE 21 MG/24HR TD PT24
21.0000 mg | MEDICATED_PATCH | Freq: Every day | TRANSDERMAL | Status: DC
  Administered 2018-03-02 – 2018-03-04 (×3): 21 mg via TRANSDERMAL
  Filled 2018-03-02 (×3): qty 1

## 2018-03-02 MED ORDER — INSULIN ASPART 100 UNIT/ML ~~LOC~~ SOLN: 0.0000 [IU] | Freq: Every day | SUBCUTANEOUS | Status: DC

## 2018-03-02 NOTE — ED Notes (Signed)
CODE STROKE CALLED TO [phone] 

## 2018-03-02 NOTE — Consult Note (Addendum)
   [facility] Tele[facility]urology Consult Services  Impression:  Symptoms could represent a TIA/stroke with numerous localizations, but self-reported dysarthria and tunnel vision could be a hypoperfusion event from cardiac issue or a toxic/metabolic process.  Migraine is on the differential, as well.   Not a tpa candidate due to: resolved, normal exam Not an NIR candidate due to: resolved, presentation not suggestive of LVO   Differential Diagnosis:   1. Cardioembolic stroke  2. Small vessel disease/lacune  3. Thromboembolic, artery-to-artery mechanism  4. Hypercoagulable state-related infarct  5. Transient ischemic attack  6. Thrombotic mechanism, large artery disease   Comments:   Door time: 1555 [facility] contacted: 1601 [facility] at bedside: 1605 NIHSS assessment time: 1608  Recommendations:  ASA MRI brain wo inpatient neurology consultation Inpatient stroke evaluation as per Neurology/ Internal Medicine Discussed with ED MD  -----------------------------------------------------------------------------------------  CC: stroke alert  History of Present Illness  Patient is a 47 year old man with a history of HTN, DM who presented with slurred speech.  States at midnight started feeling nauseated and generally weak/fatigued, like he had just run a marathon.  He went to work today and around noon developed mild slurred speech and felt his tongue was thick.  He also felt some tunneling of the vision.  Symptoms resolved now.  Had similar episode with dysarthria with a complex migraine, but he states this was different.  Diagnostic: CT head wo - nothing acute  Exam: NIHSS score: 0 1a LOC: 0  1b Questions: 0  1c Commands: 0  2 Gaze: 0  3 VF: 0  4 Face: 0  5a Motor arm left: 0  5b Motor arm right: 0  6a Motor leg left: 0 6b Motor leg right: 0 7 Ataxia: 0  8 Sensory: 0   9: Language: 0  10: Speech: 0  11: Extinction: 0       Medical Decision Making:    - Extensive number of diagnosis or management options are considered above.   - Extensive amount of complex data reviewed.   - High risk of complication and/or morbidity or mortality are associated with differential diagnostic considerations above.  - There may be Uncertain outcome and increased probability of prolonged functional impairment or high probability of severe prolonged functional impairment associated with some of these differential diagnosis.   Medical Data Reviewed:  1.Data reviewed include clinical labs, radiology,  Medical Tests;   2.Tests results discussed w/performing or interpreting physician;   3.Obtaining/reviewing old medical records;  4.Obtaining case history from another source;  5.Independent review of image, tracing or specimen.    Patient was informed the Neurology Consult would happen via telehealth (remote video) and consented to receiving care in this manner.

## 2018-03-02 NOTE — ED Triage Notes (Signed)
FIRST NURSE NOTE-c/o generalized weakness and slurring words since last night 2000.  Ambulatory with steady gait. Feels like going to pass out.  Pulled next for triage to get accurate timeline and sx.

## 2018-03-02 NOTE — Progress Notes (Signed)
   03/02/18 1558  Clinical Encounter Type  Visited With Family  Visit Type Code  Spiritual Encounters  Spiritual Needs Emotional   Chaplain responded to code stroke, patient not in room.  Chaplain engaged woman waiting in the room.  She would prefer that chaplain check in periodically.

## 2018-03-02 NOTE — ED Notes (Signed)
Attempted to call report. RN in isolation room at this time and will call back for report.

## 2018-03-02 NOTE — ED Triage Notes (Addendum)
PT arrived with wife with complaints of slurred speech since 1300. Pt reports weakness since 1200 last night. . Pt alert and oriented x 4. Pt does report his tongue sensation being "off," like his tongue was getting in the way. Unilateral weakness upon assessment. MD informed of situation and states to initiate code stroke.

## 2018-03-02 NOTE — Telephone Encounter (Signed)
Pt is c/o presyncope. States he feels almost euphoric, as if in a daze. He is c/o almost "blacking out" when he coughs. He states he had a slight headache this morning, and was vomiting last night while falling asleep. He also states he has some SOB. Denies chest pain. Also c/o "struggling to find my words" and weakness, "like I ran a marathon". Pt has a H/O TIA. Per [name], pt was advised to go to the ER. He agrees with plan.

## 2018-03-02 NOTE — ED Provider Notes (Signed)
[facility] Emergency Department Provider Note   ____________________________________________   I have reviewed the triage vital signs and the nursing notes.   HISTORY  Chief Complaint Slurred speech  History limited by: Not Limited   HPI Keith [name] is a 47 y.o. male who presents to the emergency department today because of concerns for slurred speech.  Patient states the symptoms started this morning.  He states that he felt like he knew what words he wanted to say he was just having a hard time getting them out.  Part of this was that he felt like his tongue was swollen.  The patient states he has had somewhat similar symptoms in the past.  He does state that he has been under a lot of stress recently at work.  By the time my examination patient was feeling better.  He denies any other recent illnesses.   Per medical record review patient has a history of slurred speech found secondary to complicated migraine with negative MRI and admission roughly 3 years ago.  Past Medical History:  Diagnosis Date  . CHF (congestive heart failure) (HCC)   . Coronary artery disease   . Diabetes mellitus   . Frequent headaches   . Hypertension     Patient Active Problem List   Diagnosis Date Noted  . Acute systolic heart failure (HCC) 07/11/2017  . Unstable angina (HCC)   . Other chest pain   . Migraine aura, persistent, with status migrainosus   . Hypertension associated with diabetes (HCC) 01/23/2015  . Slurred speech 01/23/2015  . Headache 01/23/2015  . DM (diabetes mellitus) with complications (HCC) 01/23/2015  . TIA (transient ischemic attack) 01/23/2015    Past Surgical History:  Procedure Laterality Date  . COLONOSCOPY WITH PROPOFOL N/A 02/19/2017   Procedure: COLONOSCOPY WITH PROPOFOL;  Surgeon: [name], MD;  Location: [facility] ENDOSCOPY;  Service: Endoscopy;  Laterality: N/A;  . ESOPHAGOGASTRODUODENOSCOPY (EGD) WITH PROPOFOL N/A 02/19/2017   Procedure: ESOPHAGOGASTRODUODENOSCOPY (EGD) WITH PROPOFOL;  Surgeon: [name], MD;  Location: [facility] ENDOSCOPY;  Service: Endoscopy;  Laterality: N/A;  . LEFT HEART CATH AND CORONARY ANGIOGRAPHY N/A 07/11/2017   Procedure: LEFT HEART CATH AND CORONARY ANGIOGRAPHY;  Surgeon: [name], MD;  Location: [facility] INVASIVE CV LAB;  Service: Cardiovascular;  Laterality: N/A;    Prior to Admission medications   Medication Sig Start Date End Date Taking? Authorizing Provider  Acetaminophen (TYLENOL PO) Take 250 mg 3 (three) times daily by mouth.    [provider]  aspirin 81 MG chewable tablet Chew 1 tablet (81 mg total) by mouth daily. 07/13/17   [name], MD  atorvastatin (LIPITOR) 40 MG tablet Take 1 tablet (40 mg total) by mouth daily at 6 PM. Patient not taking: Reported on 09/24/2017 07/22/17   Antonieta Iba, MD  carvedilol (COREG) 6.25 MG tablet Take 1 tablet (6.25 mg total) by mouth 2 (two) times daily with a meal. 02/11/18   [name], PA-C  furosemide (LASIX) 40 MG tablet Take 1 tablet (40 mg total) by mouth daily. 07/22/17   Antonieta Iba, MD  losartan (COZAAR) 100 MG tablet Take 1 tablet (100 mg total) by mouth daily. 07/22/17   Antonieta Iba, MD  metFORMIN (GLUCOPHAGE) 500 MG tablet Take 500mg  daily x 1 wk. Then take 500 mg twice daily x 1 wk. Then take 1000 mg in the morning and 500 mg at night x 1 wk. Then take 1000 mg twice daily x 1  wk. 02/15/18   [name], PA-C  Omeprazole 20 MG TBEC Take 2 tablets (40 mg total) by mouth daily. Patient not taking: Reported on 09/24/2017 07/22/17   Antonieta Iba, MD  potassium chloride SA (K-DUR,KLOR-CON) 20 MEQ tablet Take 1 tablet (20 mEq total) by mouth daily. Patient not taking: Reported on 09/24/2017 07/22/17   Antonieta Iba, MD  spironolactone (ALDACTONE) 25 MG tablet Take 1 tablet (25 mg total) by mouth daily. Patient not taking: Reported on 09/24/2017 07/22/17   Antonieta Iba, MD    Allergies Other  Family  History  Problem Relation Age of Onset  . Hypertension Mother   . Diabetes Mother   . Hypertension Father   . Diabetes Father     Social History Social History   Tobacco Use  . Smoking status: Current Every Day Smoker    Packs/day: 1.00    Types: Cigarettes  . Smokeless tobacco: Never Used  Substance Use Topics  . Alcohol use: Yes    Comment: Occasionally   . Drug use: No    Review of Systems Constitutional: No fever/chills Eyes: No visual changes. ENT: Sensation of tongue swelling Cardiovascular: Denies chest pain. Respiratory: Denies shortness of breath. Gastrointestinal: No abdominal pain.  No nausea, no vomiting.  No diarrhea.   Genitourinary: Negative for dysuria. Musculoskeletal: Negative for back pain. Skin: Negative for rash. Neurological: Positive for slurred speech.  ____________________________________________   PHYSICAL EXAM:  VITAL SIGNS: ED Triage Vitals  Enc Vitals Group     BP 03/02/18 1548 (!) 142/80     Pulse Rate 03/02/18 1548 86     Resp 03/02/18 1548 18     Temp 03/02/18 1548 98.5 F (36.9 C)     Temp Source 03/02/18 1548 Oral     SpO2 03/02/18 1548 94 %     Weight 03/02/18 1549 225 lb (102.1 kg)     Height 03/02/18 1549 5\' 4"  (1.626 m)     Head Circumference --      Peak Flow --      Pain Score 03/02/18 1551 0   Constitutional: Alert and oriented. Well appearing and in no distress. Eyes: Conjunctivae are normal.  ENT   Head: Normocephalic and atraumatic.   Nose: No congestion/rhinnorhea.   Mouth/Throat: Mucous membranes are moist.   Neck: No stridor. Hematological/Lymphatic/Immunilogical: No cervical lymphadenopathy. Cardiovascular: Normal rate, regular rhythm.  No murmurs, rubs, or gallops.  Respiratory: Normal respiratory effort without tachypnea nor retractions. Breath sounds are clear and equal bilaterally. No wheezes/rales/rhonchi. Gastrointestinal: Soft and non tender. No rebound. No guarding.  Genitourinary:  Deferred Musculoskeletal: Normal range of motion in all extremities. No lower extremity edema. Neurologic:  Normal speech and language. No gross focal neurologic deficits are appreciated.  Skin:  Skin is warm, dry and intact. No rash noted. Psychiatric: Mood and affect are normal. Speech and behavior are normal. Patient exhibits appropriate insight and judgment.  ____________________________________________    LABS (pertinent positives/negatives)  INR 0.94 CBC wbc 5.5, hgb 15.2, plt 166 CMP na 136, k 3.4, glu 236 ____________________________________________   EKG  I, Phineas Semen, attending physician, personally viewed and interpreted this EKG  EKG Time: 1516 Rate: 84 Rhythm: sinus rhythm Axis: left axis deviation Intervals: qtc 515 QRS: left bundle branch block ST changes: no st elevation equivalent Impression: abnormal ekg   ____________________________________________    RADIOLOGY  CT head No acute findings  I, Alexander Aument, personally discussed these images and results by phone with the on-call  radiologist and used this discussion as part of my medical decision making.   ____________________________________________   PROCEDURES  Procedures  ____________________________________________   INITIAL IMPRESSION / ASSESSMENT AND PLAN / ED COURSE  Pertinent labs & imaging results that were available during my care of the patient were reviewed by me and considered in my medical decision making (see chart for details).  Patient presented to the emergency department today because of concerns for slurred speech.  By the time of my exam the patient's symptoms had resolved.  Patient had already been evaluated by the neurologist who does recommend full stroke workup.  Per chart review patient has had similar symptoms in the past with complicated migraines.  Discussed findings and plan with patient.  ____________________________________________   FINAL CLINICAL  IMPRESSION(S) / ED DIAGNOSES  Final diagnoses:  Slurred speech     Note: This dictation was prepared with Dragon dictation. Any transcriptional errors that result from this process are unintentional     Phineas Semen, MD 03/02/18 1757

## 2018-03-02 NOTE — H&P (Signed)
[facility] [place] [facility]   PATIENT NAME: Keith [name]    MR#:  [id number]  DATE OF BIRTH:  13-Feb-1971  DATE OF ADMISSION:  03/02/2018  PRIMARY CARE PHYSICIAN: [name], PA-C   REQUESTING/REFERRING PHYSICIAN: Phineas Semen, MD  CHIEF COMPLAINT:   Slurry speech HISTORY OF PRESENT ILLNESS:  Keith [name]  is a 47 y.o. male with a known history of systolic congestive heart failure, coronary artery disease, diabetes metas, frequent migraine headaches and hypertension is presenting to the ED with a chief complaint of slurry speech.  Patient is reporting that the slurry speech started at around 12 noon and lasted until 3:30 PM.  Patient came into the emergency department code stroke called and patient was seen by telemetry neurologist, CT head is negative but the telemetry neurologist has recommended complete stroke workup.  When the patient was having dysarthria patient felt like the tongue was swollen.  During my examination patient is feeling fine and slurry speech is almost resolved.  Denies any headache during my examination and also denies any lower extremity weakness.  PAST MEDICAL HISTORY:   Past Medical History:  Diagnosis Date  . CHF (congestive heart failure) (HCC)   . Coronary artery disease   . Diabetes mellitus   . Frequent headaches   . Hypertension     PAST SURGICAL HISTOIRY:   Past Surgical History:  Procedure Laterality Date  . COLONOSCOPY WITH PROPOFOL N/A 02/19/2017   Procedure: COLONOSCOPY WITH PROPOFOL;  Surgeon: [name], MD;  Location: [facility] ENDOSCOPY;  Service: Endoscopy;  Laterality: N/A;  . ESOPHAGOGASTRODUODENOSCOPY (EGD) WITH PROPOFOL N/A 02/19/2017   Procedure: ESOPHAGOGASTRODUODENOSCOPY (EGD) WITH PROPOFOL;  Surgeon: [name], MD;  Location: [facility] ENDOSCOPY;  Service: Endoscopy;  Laterality: N/A;  . LEFT HEART CATH AND CORONARY ANGIOGRAPHY N/A 07/11/2017   Procedure: LEFT HEART CATH AND CORONARY ANGIOGRAPHY;   Surgeon: [name], MD;  Location: [facility] INVASIVE CV LAB;  Service: Cardiovascular;  Laterality: N/A;    SOCIAL HISTORY:   Social History   Tobacco Use  . Smoking status: Current Every Day Smoker    Packs/day: 1.00    Types: Cigarettes  . Smokeless tobacco: Never Used  Substance Use Topics  . Alcohol use: Yes    Comment: Occasionally     FAMILY HISTORY:   Family History  Problem Relation Age of Onset  . Hypertension Mother   . Diabetes Mother   . Hypertension Father   . Diabetes Father     DRUG ALLERGIES:   Allergies  Allergen Reactions  . Other Other (See Comments)    Muscle Relaxers (caused psychotic break)    REVIEW OF SYSTEMS:  CONSTITUTIONAL: No fever, fatigue or weakness.  EYES: No blurred or double vision.  EARS, NOSE, AND THROAT: No tinnitus or ear pain.  RESPIRATORY: No cough, shortness of breath, wheezing or hemoptysis.  CARDIOVASCULAR: No chest pain, orthopnea, edema.  GASTROINTESTINAL: No nausea, vomiting, diarrhea or abdominal pain.  GENITOURINARY: No dysuria, hematuria.  ENDOCRINE: No polyuria, nocturia,  HEMATOLOGY: No anemia, easy bruising or bleeding SKIN: No rash or lesion. MUSCULOSKELETAL: No joint pain or arthritis.   NEUROLOGIC: No tingling, numbness, weakness.  PSYCHIATRY: No anxiety or depression.   MEDICATIONS AT HOME:   Prior to Admission medications   Medication Sig Start Date End Date Taking? Authorizing Provider  aspirin 81 MG chewable tablet Chew 1 tablet (81 mg total) by mouth daily. 07/13/17  Yes [name], MD  furosemide (LASIX) 40 MG tablet Take  1 tablet (40 mg total) by mouth daily. 07/22/17  Yes Antonieta Iba, MD  losartan (COZAAR) 100 MG tablet Take 1 tablet (100 mg total) by mouth daily. 07/22/17  Yes Antonieta Iba, MD  metFORMIN (GLUCOPHAGE) 500 MG tablet Take 500mg  daily x 1 wk. Then take 500 mg twice daily x 1 wk. Then take 1000 mg in the morning and 500 mg at night x 1 wk. Then take 1000 mg twice daily x 1  wk. 02/15/18  Yes [name], PA-C  atorvastatin (LIPITOR) 40 MG tablet Take 1 tablet (40 mg total) by mouth daily at 6 PM. Patient not taking: Reported on 09/24/2017 07/22/17   Antonieta Iba, MD  carvedilol (COREG) 6.25 MG tablet Take 1 tablet (6.25 mg total) by mouth 2 (two) times daily with a meal. Patient not taking: Reported on 03/02/2018 02/11/18   [name], PA-C  Omeprazole 20 MG TBEC Take 2 tablets (40 mg total) by mouth daily. Patient not taking: Reported on 09/24/2017 07/22/17   Antonieta Iba, MD  potassium chloride SA (K-DUR,KLOR-CON) 20 MEQ tablet Take 1 tablet (20 mEq total) by mouth daily. Patient not taking: Reported on 09/24/2017 07/22/17   Antonieta Iba, MD  spironolactone (ALDACTONE) 25 MG tablet Take 1 tablet (25 mg total) by mouth daily. Patient not taking: Reported on 09/24/2017 07/22/17   Antonieta Iba, MD      VITAL SIGNS:  Blood pressure 137/82, pulse 74, temperature 98 F (36.7 C), temperature source Oral, resp. rate (!) 21, height 5\' 4"  (1.626 m), weight 102.1 kg (225 lb), SpO2 94 %.  PHYSICAL EXAMINATION:  GENERAL:  47 y.o.-year-old patient lying in the bed with no acute distress.  EYES: Pupils equal, round, reactive to light and accommodation. No scleral icterus. Extraocular muscles intact.  HEENT: Head atraumatic, normocephalic. Oropharynx and nasopharynx clear.  NECK:  Supple, no jugular venous distention. No thyroid enlargement, no tenderness.  LUNGS: Normal breath sounds bilaterally, no wheezing, rales,rhonchi or crepitation. No use of accessory muscles of respiration.  CARDIOVASCULAR: S1, S2 normal. No murmurs, rubs, or gallops.  ABDOMEN: Soft, nontender, nondistended. Bowel sounds present. No organomegaly or mass.  EXTREMITIES: No pedal edema, cyanosis, or clubbing.  NEUROLOGIC: Cranial nerves II through XII are intact. Muscle strength 5/5 in all extremities. Sensation intact. Gait not checked.  PSYCHIATRIC: The patient is alert and  oriented x 3.  SKIN: No obvious rash, lesion, or ulcer.   LABORATORY PANEL:   CBC Recent Labs  Lab 03/02/18 1610  WBC 5.5  HGB 15.2  HCT 44.1  PLT 166   ------------------------------------------------------------------------------------------------------------------  Chemistries  Recent Labs  Lab 03/02/18 1610  NA 136  K 3.4*  CL 99*  CO2 29  GLUCOSE 236*  BUN 15  CREATININE 0.89  CALCIUM 8.9  AST 26  ALT 18  ALKPHOS 72  BILITOT 1.3*   ------------------------------------------------------------------------------------------------------------------  Cardiac Enzymes Recent Labs  Lab 03/02/18 1610  TROPONINI <0.03   ------------------------------------------------------------------------------------------------------------------  RADIOLOGY:  Ct Head Code Stroke Wo Contrast  Result Date: 03/02/2018 CLINICAL DATA:  Code stroke.  Slurred speech EXAM: CT HEAD WITHOUT CONTRAST TECHNIQUE: Contiguous axial images were obtained from the base of the skull through the vertex without intravenous contrast. COMPARISON:  CT head 12/30/2016 FINDINGS: Brain: No evidence of acute infarction, hemorrhage, hydrocephalus, extra-axial collection or mass lesion/mass effect. Vascular: Negative for hyperdense vessel Skull: Negative Sinuses/Orbits: Chronic left mastoid effusion. Remaining sinuses clear. Other: None ASPECTS [facility] Stroke Program Early CT Score) -  Ganglionic level infarction (caudate, lentiform nuclei, internal capsule, insula, M1-M3 cortex): 7 - Supraganglionic infarction (M4-M6 cortex): 3 Total score (0-10 with 10 being normal): 10 IMPRESSION: 1. Negative CT head 2. ASPECTS is 10 3. These results were called by telephone at the time of interpretation on 03/02/2018 at 4:06 pm to Dr. Derrill Kay , who verbally acknowledged these results. Electronically Signed   By: Marlan Palau M.D.   On: 03/02/2018 16:07    EKG:   Orders placed or performed during the hospital encounter of  03/02/18  . EKG 12-Lead  . EKG 12-Lead  . ED EKG  . ED EKG    IMPRESSION AND PLAN:  Braden Cimo  is a 47 y.o. male with a known history of systolic congestive heart failure, coronary artery disease, diabetes metas, frequent migraine headaches and hypertension is presenting to the ED with a chief complaint of slurry speech.  Patient is reporting that the slurry speech started at around 12 noon and lasted until 3:30 PM.  Patient came into the emergency department code stroke called and patient was seen by telemetry neurologist, CT head is negative but the telemetry neurologist has recommended complete stroke workup   #TIA versus CVA with dysarthria differential diagnoses hemiplegic migraine Admit to MedSurg unit CT head is negative Telemetry neurologist has recommended complete stroke workup We will get MRI of the brain MRA of the brain carotid Dopplers 2D echocardiogram PT OT evaluation Patient has passed bedside swallow evaluation  neurology consulted Check TSH fasting lipid panel and hemoglobin A1c   #Essential hypertension We will hold Coreg and Cozaar while trying to rule out TIA versus CVA to allow permissive hypertension  #History of systolic congestive heart failure Patient is not fluid overloaded at this time.  Continue Lasix and patient has been not taking spironolactone will hold off at this time Echocardiogram ordered  #Diabetes mellitus hold metformin and provide sliding scale insulin and check hemoglobin A1c  #History of coronary artery disease Continue aspirin and statin Coreg and Cozaar are on hold in view of TIA to allow permissive hypertension  Tobacco abuse disorder counseled patient to quit smoking for 5 minutes.  Offered nicotine patch but patient refused to use nicotine patch but willing to quit smoking  All the records are reviewed and case discussed with ED provider. Management plans discussed with the patient, family and they are in agreement.  CODE  STATUS: fc , wife HCPOA  TOTAL TIME TAKING CARE OF THIS PATIENT: 43  minutes.   Note: This dictation was prepared with Dragon dictation along with smaller phrase technology. Any transcriptional errors that result from this process are unintentional.  [name] M.D on 03/02/2018 at 7:38 PM  Between 7am to 6pm - Pager - [phone]  After 6pm go to [email or web address] - password [email or web address] [facility]  [facility] [place]  Office  [phone]  CC: Primary care physician; [name], PA-C

## 2018-03-02 NOTE — Progress Notes (Signed)
Family Meeting Note  Advance Directive:yes  Today a meeting took place with the Patient, wife at bedside    The following clinical team members were present during this meeting:MD  The following were discussed:Patient's diagnosis: TIA, diabetes metas, hypertension, systolic CHF, history of coronary artery disease, tobacco abuse disorder, treatment plan of care discussed in detail with the patient and wife at bedside.  They both verbalized understanding of the plan. , Patient's progosis: Unable to determine and Goals for treatment: Full Code, wife is healthcare power of attorney  Additional follow-up to be provided: Hospitalist and neurologist  Time spent during discussion:18 min  [name], MD

## 2018-03-02 NOTE — Progress Notes (Signed)
CODE STROKE- PHARMACY COMMUNICATION   Time CODE STROKE called/page received:15:56  Time response to CODE STROKE was made (in person or via phone): 16:01  Time Stroke Kit retrieved from [place] (only if needed):  Name of Provider/Nurse contacted: Aldona Bar RN  Past Medical History:  Diagnosis Date  . CHF (congestive heart failure) ([place])   . Coronary artery disease   . Diabetes mellitus   . Frequent headaches   . Hypertension    Prior to Admission medications   Medication Sig Start Date End Date Taking? Authorizing Provider  Acetaminophen (TYLENOL PO) Take 250 mg 3 (three) times daily by mouth.    [provider]  aspirin 81 MG chewable tablet Chew 1 tablet (81 mg total) by mouth daily. 07/13/17   Demetrios Loll, MD  atorvastatin (LIPITOR) 40 MG tablet Take 1 tablet (40 mg total) by mouth daily at 6 PM. Patient not taking: Reported on 09/24/2017 07/22/17   Minna Merritts, MD  carvedilol (COREG) 6.25 MG tablet Take 1 tablet (6.25 mg total) by mouth 2 (two) times daily with a meal. 02/11/18   Trinna Post, PA-C  furosemide (LASIX) 40 MG tablet Take 1 tablet (40 mg total) by mouth daily. 07/22/17   Minna Merritts, MD  losartan (COZAAR) 100 MG tablet Take 1 tablet (100 mg total) by mouth daily. 07/22/17   Minna Merritts, MD  metFORMIN (GLUCOPHAGE) 500 MG tablet Take 519m daily x 1 wk. Then take 500 mg twice daily x 1 wk. Then take 1000 mg in the morning and 500 mg at night x 1 wk. Then take 1000 mg twice daily x 1 wk. 02/15/18   PTrinna Post PA-C  Omeprazole 20 MG TBEC Take 2 tablets (40 mg total) by mouth daily. Patient not taking: Reported on 09/24/2017 07/22/17   GMinna Merritts MD  potassium chloride SA (K-DUR,KLOR-CON) 20 MEQ tablet Take 1 tablet (20 mEq total) by mouth daily. Patient not taking: Reported on 09/24/2017 07/22/17   GMinna Merritts MD  spironolactone (ALDACTONE) 25 MG tablet Take 1 tablet (25 mg total) by mouth daily. Patient not taking: Reported on  09/24/2017 07/22/17   GMinna Merritts MD    NLaural Benes,PharmD Clinical Pharmacist  03/02/2018  4:01 PM

## 2018-03-02 NOTE — ED Notes (Signed)
CODE  STROKE  CALLED  TO 333/  INFORMED  DR  Pershing Proud  MD

## 2018-03-02 NOTE — Code Documentation (Signed)
Pt arrives with complaints of fatigue and dysarthria, per pt at midnight he began to feel tired with nausea and vomiting, at 1300 today while at work pt developed slurred speech, wife states she could not understand him on the phone, code stroke activated in triage, Dr. Raynelle Chary provided CT clearance, non con head CT preformed, pt taken to room 26, NIHSS 0, tele-neuro evaluation, no tPA due to resolving symptoms, speech appears clear and WNL, pt states hx of migraines and stress, report off to [employer]

## 2018-03-02 NOTE — Telephone Encounter (Signed)
Noted, thank you

## 2018-03-03 ENCOUNTER — Observation Stay: Payer: Managed Care, Other (non HMO)

## 2018-03-03 ENCOUNTER — Observation Stay (HOSPITAL_BASED_OUTPATIENT_CLINIC_OR_DEPARTMENT_OTHER)
Admit: 2018-03-03 | Discharge: 2018-03-03 | Disposition: A | Discharge status: Home / Self Care | Payer: Managed Care, Other (non HMO) | Attending: Internal Medicine | Admitting: Internal Medicine

## 2018-03-03 DIAGNOSIS — G459 Transient cerebral ischemic attack, unspecified: Secondary | ICD-10-CM | POA: Diagnosis not present

## 2018-03-03 DIAGNOSIS — R4781 Slurred speech: Secondary | ICD-10-CM

## 2018-03-03 LAB — ECHOCARDIOGRAM COMPLETE
HEIGHTINCHES: 64 in
Weight: 3600 oz

## 2018-03-03 LAB — HEMOGLOBIN A1C
HEMOGLOBIN A1C: 8.5 % — AB (ref 4.8–5.6)
Mean Plasma Glucose: 197.25 mg/dL

## 2018-03-03 LAB — LIPID PANEL
CHOLESTEROL: 134 mg/dL (ref 0–200)
HDL: 31 mg/dL — ABNORMAL LOW (ref >40)
LDL Cholesterol: 70 mg/dL (ref 0–99)
TRIGLYCERIDES: 165 mg/dL — AB (ref <150)
Total CHOL/HDL Ratio: 4.3 RATIO
VLDL: 33 mg/dL (ref 0–40)

## 2018-03-03 LAB — TSH: TSH: 3.644 u[IU]/mL (ref 0.350–4.500)

## 2018-03-03 LAB — GLUCOSE, CAPILLARY
GLUCOSE-CAPILLARY: 245 mg/dL — AB (ref 65–99)
GLUCOSE-CAPILLARY: 170 mg/dL — AB (ref 65–99)
GLUCOSE-CAPILLARY: 179 mg/dL — AB (ref 65–99)
Glucose-Capillary: 212 mg/dL — ABNORMAL HIGH (ref 65–99)

## 2018-03-03 NOTE — Discharge Summary (Signed)
Keith [name], is a 47 y.o. male  DOB 02-May-1971  MRN [id number].  Admission date:  03/02/2018  Admitting Physician  [name], MD  Discharge Date:  03/03/2018   Primary MD  [name], PA-C  Recommendations for primary care physician for things to follow:   Follow with PCP in 1 week   Admission Diagnosis  Slurred speech [R47.81]   Discharge Diagnosis  Slurred speech [R47.81]    Active Problems:   TIA (transient ischemic attack)      Past Medical History:  Diagnosis Date  . CHF (congestive heart failure) (HCC)   . Coronary artery disease   . Diabetes mellitus   . Frequent headaches   . Hypertension     Past Surgical History:  Procedure Laterality Date  . COLONOSCOPY WITH PROPOFOL N/A 02/19/2017   Procedure: COLONOSCOPY WITH PROPOFOL;  Surgeon: [name], MD;  Location: [facility] ENDOSCOPY;  Service: Endoscopy;  Laterality: N/A;  . ESOPHAGOGASTRODUODENOSCOPY (EGD) WITH PROPOFOL N/A 02/19/2017   Procedure: ESOPHAGOGASTRODUODENOSCOPY (EGD) WITH PROPOFOL;  Surgeon: [name], MD;  Location: [facility] ENDOSCOPY;  Service: Endoscopy;  Laterality: N/A;  . LEFT HEART CATH AND CORONARY ANGIOGRAPHY N/A 07/11/2017   Procedure: LEFT HEART CATH AND CORONARY ANGIOGRAPHY;  Surgeon: [name], MD;  Location: [facility]IVE [facility];  Service: Cardiovascular;  Laterality: N/A;       History of present illness and  Hospital Course:     Kindly see H&P for history of present illness and admission details, please review complete Labs, Consult reports and Test reports for all details in brief  HPI  from the history and physical done on the day of admission 47 year old male patient with history of chronic systolic heart failure, CAD, diabetes mellitus type 2, hypertension came in because of slurred speech, patient had  slurred speech yesterday afternoon from the fall number 3:30 PM.  Admitted for evaluation of TIA.    Hospital Course  #1 transient slurred speech without focal weakness.  Patient felt generalized weakness yesterday.  And he feels completely normal, his slurred speech resolved.  Initial head CT normal, patient had MRI of the brain which did not show any acute strokes.  MRA of the brain is negative for any vessel occlusion.  Ultrasound of carotids did not show any hemodynamically significant stenosis.  Echocardiogram results are pending.  Patient already on aspirin, statin at home.  His LDL is 70.  Seen by physical therapy, speech therapy did not recommend any services.  Patient did well.  #2. hyperlipidemia: Continue statins 3.  History of chronic systolic heart failure,; appears euvolemic.  Continue Lasix 40 mg daily, Coreg, KCl, spironolactone.  35-40% by echocardiogram done in August 2018. 4.  Diabetes mellitus type 2: Patient is on metformin.  Uncontrolled.   Discharge Condition: Stable   Follow UP      Discharge Instructions  and  Discharge Medications   Stable   Allergies as of 03/03/2018      Reactions   Other Other (See Comments)   Muscle Relaxers (caused psychotic break)      Medication List    TAKE these medications   aspirin 81 MG chewable tablet Chew 1 tablet (81 mg total) by mouth daily.   atorvastatin 40 MG tablet Commonly known as:  LIPITOR Take 1 tablet (40 mg total) by mouth daily at 6 PM.   carvedilol 6.25 MG tablet Commonly known as:  COREG Take 1 tablet (6.25 mg total) by mouth 2 (two) times daily  with a meal.   furosemide 40 MG tablet Commonly known as:  LASIX Take 1 tablet (40 mg total) by mouth daily.   losartan 100 MG tablet Commonly known as:  COZAAR Take 1 tablet (100 mg total) by mouth daily.   metFORMIN 500 MG tablet Commonly known as:  GLUCOPHAGE Take 500mg  daily x 1 wk. Then take 500 mg twice daily x 1 wk. Then take 1000 mg in the  morning and 500 mg at night x 1 wk. Then take 1000 mg twice daily x 1 wk.   Omeprazole 20 MG Tbec Take 2 tablets (40 mg total) by mouth daily.   potassium chloride SA 20 MEQ tablet Commonly known as:  K-DUR,KLOR-CON Take 1 tablet (20 mEq total) by mouth daily.   spironolactone 25 MG tablet Commonly known as:  ALDACTONE Take 1 tablet (25 mg total) by mouth daily.         Diet and Activity recommendation: See Discharge Instructions above   Consults obtained -neurology   Major procedures and Radiology Reports - PLEASE review detailed and final reports for all details, in brief -    Mr Brain Wo Contrast  Result Date: 03/03/2018 CLINICAL DATA:  Initial evaluation for acute slurred speech, dysarthria. History of CHF, CAD, diabetes. EXAM: MRI HEAD WITHOUT CONTRAST MRA HEAD WITHOUT CONTRAST TECHNIQUE: Multiplanar, multiecho pulse sequences of the brain and surrounding structures were obtained without intravenous contrast. Angiographic images of the head were obtained using MRA technique without contrast. COMPARISON:  Prior head CT from 03/02/2018. FINDINGS: MRI HEAD FINDINGS Brain: Study mildly degraded by motion artifact. Cerebral volume within normal limits for age. Mild scattered subcentimeter T2/FLAIR hyperintensities noted within the periventricular, deep, and subcortical white matter both cerebral hemispheres, nonspecific, but felt to be within normal limits for age. Minimal streaky FLAIR signal abnormality noted within the superior aspect of the left cerebellar hemisphere. No con cord signal abnormality seen within this region on corresponding sequences. Finding is of uncertain etiology, but may be related to prior insult, of doubtful significance as well. No abnormal foci of restricted diffusion to suggest acute or subacute ischemia. Gray-white matter differentiation maintained. No other areas of chronic infarction. No evidence for acute or chronic intracranial hemorrhage. No mass  lesion, midline shift or mass effect. No hydrocephalus. No extra-axial fluid collection. Major dural sinuses grossly patent. Pituitary gland suprasellar region normal. Midline structures intact and normal. Vascular: Major intracranial vascular flow voids maintained. Skull and upper cervical spine: Craniocervical junction within normal limits. Upper cervical spine within normal limits. Bone marrow signal intensity within normal limits. No scalp soft tissue abnormality. Sinuses/Orbits: Globes and orbital soft tissues within normal limits. Paranasal sinuses are clear. Left mastoid effusion noted. Other: None. MRA HEAD FINDINGS ANTERIOR CIRCULATION: Distal cervical segments of the internal carotid arteries are patent with antegrade flow. Petrous, cavernous, and supraclinoid segments widely patent bilaterally. Origin of the ophthalmic arteries patent. ICA termini widely patent. A1 segments, anterior communicating artery common anterior cerebral arteries widely patent bilaterally. No M1 stenosis or occlusion. Distal MCA branches well perfused and symmetric probable mild distal small vessel atheromatous irregularity noted. POSTERIOR CIRCULATION: Visualized vertebral arteries widely patent to the vertebrobasilar junction. Right vertebral artery slightly dominant. Posterior inferior cerebral arteries patent proximally. Basilar artery widely patent to its distal aspect. Superior cerebellar and posterior cerebral arteries well perfused bilaterally without flow-limiting stenosis. Small left posterior communicating artery noted. Probable distal small vessel atheromatous irregularity noted. No aneurysm. IMPRESSION: MRI HEAD IMPRESSION 1. Negative brain MRI for age.  No acute intracranial infarct or other process identified. 2. Left mastoid effusion. MRA HEAD IMPRESSION 1. Negative intracranial MRA. No large vessel occlusion. No hemodynamically significant or correctable stenosis. 2. Mild distal small vessel atheromatous  irregularity. Electronically Signed   By: Rise Mu M.D.   On: 03/03/2018 02:19   US Carotid Bilateral (at [facility] And [facility] Only)  Result Date: 03/03/2018 CLINICAL DATA:  47 year old male with history of TIA EXAM: BILATERAL CAROTID DUPLEX ULTRASOUND TECHNIQUE: Wallace Cullens scale imaging, color Doppler and duplex ultrasound were performed of bilateral carotid and vertebral arteries in the neck. COMPARISON:  No prior duplex FINDINGS: Criteria: Quantification of carotid stenosis is based on velocity parameters that correlate the residual internal carotid diameter with NASCET-based stenosis levels, using the diameter of the distal internal carotid lumen as the denominator for stenosis measurement. The following velocity measurements were obtained: RIGHT ICA:  Systolic 72 cm/sec, Diastolic 33 cm/sec CCA:  89 cm/sec SYSTOLIC ICA/CCA RATIO:  0.8 ECA:  93 cm/sec LEFT ICA:  Systolic 74 cm/sec, Diastolic for 30 cm/sec CCA:  83 cm/sec SYSTOLIC ICA/CCA RATIO:  0.9 ECA:  99 cm/sec Right Brachial SBP: Not acquired Left Brachial SBP: Not acquired RIGHT CAROTID ARTERY: No significant calcified disease of the right common carotid artery. Intermediate waveform maintained. Homogeneous plaque without significant calcifications at the right carotid bifurcation. Low resistance waveform of the right ICA. No significant tortuosity. RIGHT VERTEBRAL ARTERY: Antegrade flow with low resistance waveform. LEFT CAROTID ARTERY: No significant calcified disease of the left common carotid artery. Intermediate waveform maintained. Homogeneous plaque at the left carotid bifurcation without significant calcifications. Low resistance waveform of the left ICA. LEFT VERTEBRAL ARTERY:  Antegrade flow with low resistance waveform. IMPRESSION: Color duplex indicates minimal homogeneous plaque, with no hemodynamically significant stenosis by duplex criteria in the extracranial cerebrovascular circulation. Signed, Yvone Neu. Loreta Ave, DO [facility] Electronically Signed   By: Gilmer Mor D.O.   On: 03/03/2018 08:55   Mr [name] Head/brain AS Cm  Result Date: 03/03/2018 CLINICAL DATA:  Initial evaluation for acute slurred speech, dysarthria. History of CHF, CAD, diabetes. EXAM: MRI HEAD WITHOUT CONTRAST MRA HEAD WITHOUT CONTRAST TECHNIQUE: Multiplanar, multiecho pulse sequences of the brain and surrounding structures were obtained without intravenous contrast. Angiographic images of the head were obtained using MRA technique without contrast. COMPARISON:  Prior head CT from 03/02/2018. FINDINGS: MRI HEAD FINDINGS Brain: Study mildly degraded by motion artifact. Cerebral volume within normal limits for age. Mild scattered subcentimeter T2/FLAIR hyperintensities noted within the periventricular, deep, and subcortical white matter both cerebral hemispheres, nonspecific, but felt to be within normal limits for age. Minimal streaky FLAIR signal abnormality noted within the superior aspect of the left cerebellar hemisphere. No con cord signal abnormality seen within this region on corresponding sequences. Finding is of uncertain etiology, but may be related to prior insult, of doubtful significance as well. No abnormal foci of restricted diffusion to suggest acute or subacute ischemia. Gray-white matter differentiation maintained. No other areas of chronic infarction. No evidence for acute or chronic intracranial hemorrhage. No mass lesion, midline shift or mass effect. No hydrocephalus. No extra-axial fluid collection. Major dural sinuses grossly patent. Pituitary gland suprasellar region normal. Midline structures intact and normal. Vascular: Major intracranial vascular flow voids maintained. Skull and upper cervical spine: Craniocervical junction within normal limits. Upper cervical spine within normal limits. Bone marrow signal intensity within normal limits. No scalp soft tissue abnormality.  Sinuses/Orbits: Globes and orbital soft tissues within normal limits.  Paranasal sinuses are clear. Left mastoid effusion noted. Other: None. MRA HEAD FINDINGS ANTERIOR CIRCULATION: Distal cervical segments of the internal carotid arteries are patent with antegrade flow. Petrous, cavernous, and supraclinoid segments widely patent bilaterally. Origin of the ophthalmic arteries patent. ICA termini widely patent. A1 segments, anterior communicating artery common anterior cerebral arteries widely patent bilaterally. No M1 stenosis or occlusion. Distal MCA branches well perfused and symmetric probable mild distal small vessel atheromatous irregularity noted. POSTERIOR CIRCULATION: Visualized vertebral arteries widely patent to the vertebrobasilar junction. Right vertebral artery slightly dominant. Posterior inferior cerebral arteries patent proximally. Basilar artery widely patent to its distal aspect. Superior cerebellar and posterior cerebral arteries well perfused bilaterally without flow-limiting stenosis. Small left posterior communicating artery noted. Probable distal small vessel atheromatous irregularity noted. No aneurysm. IMPRESSION: MRI HEAD IMPRESSION 1. Negative brain MRI for age. No acute intracranial infarct or other process identified. 2. Left mastoid effusion. MRA HEAD IMPRESSION 1. Negative intracranial MRA. No large vessel occlusion. No hemodynamically significant or correctable stenosis. 2. Mild distal small vessel atheromatous irregularity. Electronically Signed   By: Rise Mu M.D.   On: 03/03/2018 02:19   Ct Head Code Stroke Wo Contrast  Result Date: 03/02/2018 CLINICAL DATA:  Code stroke.  Slurred speech EXAM: CT HEAD WITHOUT CONTRAST TECHNIQUE: Contiguous axial images were obtained from the base of the skull through the vertex without intravenous contrast. COMPARISON:  CT head 12/30/2016 FINDINGS: Brain: No evidence of acute infarction, hemorrhage, hydrocephalus, extra-axial  collection or mass lesion/mass effect. Vascular: Negative for hyperdense vessel Skull: Negative Sinuses/Orbits: Chronic left mastoid effusion. Remaining sinuses clear. Other: None ASPECTS (Alberta Stroke Program Early CT Score) - Ganglionic level infarction (caudate, lentiform nuclei, internal capsule, insula, M1-M3 cortex): 7 - Supraganglionic infarction (M4-M6 cortex): 3 Total score (0-10 with 10 being normal): 10 IMPRESSION: 1. Negative CT head 2. ASPECTS is 10 3. These results were called by telephone at the time of interpretation on 03/02/2018 at 4:06 pm to Dr. Derrill Kay , who verbally acknowledged these results. Electronically Signed   By: Marlan Palau M.D.   On: 03/02/2018 16:07    Micro Results    No results found for this or any previous visit (from the past 240 hour(s)).     Today   Subjective:   William Dalton today has no headache,no chest abdominal pain,no new weakness tingling or numbness, feels much better wants to go home today.   Objective:   Blood pressure (!) 137/97, pulse 83, temperature 97.6 F (36.4 C), temperature source Oral, resp. rate 18, height 5\' 4"  (1.626 m), weight 102.1 kg (225 lb), SpO2 97 %.  No intake or output data in the 24 hours ending 03/03/18 1153  Exam Awake Alert, Oriented x 3, No new F.N deficits, Normal affect [place].AT,PERRAL Supple Neck,No JVD, No cervical lymphadenopathy appriciated.  Symmetrical Chest wall movement, Good air movement bilaterally, CTAB RRR,No Gallops,Rubs or new Murmurs, No Parasternal Heave +ve B.Sounds, Abd Soft, Non tender, No organomegaly appriciated, No rebound -guarding or rigidity. No Cyanosis, Clubbing or edema, No new Rash or bruise  Data Review   CBC w Diff:  Lab Results  Component Value Date   WBC 5.5 03/02/2018   HGB 15.2 03/02/2018   HGB 16.1 02/11/2018   HCT 44.1 03/02/2018   HCT 46.0 02/11/2018   PLT 166 03/02/2018   PLT 186 02/11/2018   LYMPHOPCT 13 03/02/2018   MONOPCT 7 03/02/2018   EOSPCT 2  03/02/2018   BASOPCT 0 03/02/2018    CMP:  Lab Results  Component Value Date   NA 136 03/02/2018   NA 138 02/11/2018   K 3.4 (L) 03/02/2018   CL 99 (L) 03/02/2018   CO2 29 03/02/2018   BUN 15 03/02/2018   BUN 9 02/11/2018   CREATININE 0.89 03/02/2018   CREATININE 0.72 12/14/2016   PROT 7.6 03/02/2018   PROT 6.9 02/11/2018   ALBUMIN 4.3 03/02/2018   ALBUMIN 4.4 02/11/2018   BILITOT 1.3 (H) 03/02/2018   BILITOT 1.2 02/11/2018   ALKPHOS 72 03/02/2018   AST 26 03/02/2018   ALT 18 03/02/2018  .   Total Time in preparing paper work, data evaluation and todays exam - 35 minutes  Katha Hamming M.D on 03/03/2018 at 11:53 AM    Note: This dictation was prepared with Dragon dictation along with smaller phrase technology. Any transcriptional errors that result from this process are unintentional.

## 2018-03-03 NOTE — Progress Notes (Signed)
OT Screen Note  Patient Details Name: Keith [name] MRN: [id number] DOB: [birth year]   Cancelled Treatment:    Reason Eval/Treat Not Completed: OT screened, no needs identified, will sign off. Order received, chart reviewed. Pt reporting feeling back to baseline independence, denies any strength/coordination/sensation/visual/language deficits. No skilled OT needs identified, will sign off. Please re-consult if additional needs arise.   Richrd Prime, MPH, MS, OTR/L ascom [phone] 03/03/18, 9:44 AM

## 2018-03-03 NOTE — Consult Note (Addendum)
Referring Physician: Luberta Mutter    Chief Complaint: Slurred speech  HPI: Keith [name] is an 47 y.o. male with a history of DM and HTN who presents with report of weakness and slurred speech.  Patient reports awakening yesterday and feeling weak.  On the way to work felt even weaker.  Developed tunnel vision and slurred speech.  Patient drove himself to the ED.  Code stroke called at that time.  Symptoms resolved.  Initial NIHSS of 0.  Date last known well: Date: 03/02/2018 Time last known well: Time: 13:00 tPA Given: No: Symptoms resolved  Past Medical History:  Diagnosis Date  . CHF (congestive heart failure) (HCC)   . Coronary artery disease   . Diabetes mellitus   . Frequent headaches   . Hypertension     Past Surgical History:  Procedure Laterality Date  . COLONOSCOPY WITH PROPOFOL N/A 02/19/2017   Procedure: COLONOSCOPY WITH PROPOFOL;  Surgeon: [name], MD;  Location: [facility] ENDOSCOPY;  Service: Endoscopy;  Laterality: N/A;  . ESOPHAGOGASTRODUODENOSCOPY (EGD) WITH PROPOFOL N/A 02/19/2017   Procedure: ESOPHAGOGASTRODUODENOSCOPY (EGD) WITH PROPOFOL;  Surgeon: [name], MD;  Location: [facility] ENDOSCOPY;  Service: Endoscopy;  Laterality: N/A;  . LEFT HEART CATH AND CORONARY ANGIOGRAPHY N/A 07/11/2017   Procedure: LEFT HEART CATH AND CORONARY ANGIOGRAPHY;  Surgeon: [name], MD;  Location: [facility] IN[facility]ASIVE [facility];  Service: Cardiovascular;  Laterality: N/A;    Family History  Problem Relation Age of Onset  . Hypertension Mother   . Diabetes Mother   . Hypertension Father   . Diabetes Father    Social History:  reports that he has been smoking cigarettes.  He has been smoking about 1.00 pack per day. He has never used smokeless tobacco. He reports that he drinks alcohol. He reports that he does not use drugs.  Allergies:  Allergies  Allergen Reactions  . Other Other (See Comments)    Muscle Relaxers (caused psychotic break)    Medications:  I have reviewed the  patient's current medications. Prior to Admission:  Medications Prior to Admission  Medication Sig Dispense Refill Last Dose  . aspirin 81 MG chewable tablet Chew 1 tablet (81 mg total) by mouth daily. 30 tablet 2 Unknown at Unknown  . furosemide (LASIX) 40 MG tablet Take 1 tablet (40 mg total) by mouth daily. 90 tablet 3 03/02/2018 at 0800  . losartan (COZAAR) 100 MG tablet Take 1 tablet (100 mg total) by mouth daily. 90 tablet 4 03/02/2018 at 0800  . metFORMIN (GLUCOPHAGE) 500 MG tablet Take 500mg  daily x 1 wk. Then take 500 mg twice daily x 1 wk. Then take 1000 mg in the morning and 500 mg at night x 1 wk. Then take 1000 mg twice daily x 1 wk. 180 tablet 3 03/02/2018 at 0800  . atorvastatin (LIPITOR) 40 MG tablet Take 1 tablet (40 mg total) by mouth daily at 6 PM. (Patient not taking: Reported on 09/24/2017) 90 tablet 4 Not Taking at Unknown time  . carvedilol (COREG) 6.25 MG tablet Take 1 tablet (6.25 mg total) by mouth 2 (two) times daily with a meal. (Patient not taking: Reported on 03/02/2018) 60 tablet 3 Not Taking at Unknown time  . Omeprazole 20 MG TBEC Take 2 tablets (40 mg total) by mouth daily. (Patient not taking: Reported on 09/24/2017) 30 each  Not Taking at Unknown time  . potassium chloride SA (K-DUR,KLOR-CON) 20 MEQ tablet Take 1 tablet (20 mEq total) by mouth daily. (Patient not taking: Reported  on 09/24/2017) 90 tablet 4 Not Taking at Unknown time  . spironolactone (ALDACTONE) 25 MG tablet Take 1 tablet (25 mg total) by mouth daily. (Patient not taking: Reported on 09/24/2017) 90 tablet 3 Not Taking at Unknown time   Scheduled: . aspirin EC  325 mg Oral Daily  . atorvastatin  40 mg Oral q1800  . enoxaparin (LOVENOX) injection  40 mg Subcutaneous Q24H  . furosemide  40 mg Oral Daily  . insulin aspart  0-5 Units Subcutaneous QHS  . insulin aspart  0-9 Units Subcutaneous TID WC  . nicotine  21 mg Transdermal Daily    ROS: History obtained from the patient  General ROS: negative  for - chills, fatigue, fever, night sweats, weight gain or weight loss Psychological ROS: negative for - behavioral disorder, hallucinations, memory difficulties, mood swings or suicidal ideation Ophthalmic ROS: negative for - blurry vision, double vision, eye pain or loss of vision ENT ROS: negative for - epistaxis, nasal discharge, oral lesions, sore throat, tinnitus or vertigo Allergy and Immunology ROS: negative for - hives or itchy/watery eyes Hematological and Lymphatic ROS: negative for - bleeding problems, bruising or swollen lymph nodes Endocrine ROS: negative for - galactorrhea, hair pattern changes, polydipsia/polyuria or temperature intolerance Respiratory ROS: cough, congestion Cardiovascular ROS: negative for - chest pain, dyspnea on exertion, edema or irregular heartbeat Gastrointestinal ROS: negative for - abdominal pain, diarrhea, hematemesis, nausea/vomiting or stool incontinence Genito-Urinary ROS: negative for - dysuria, hematuria, incontinence or urinary frequency/urgency Musculoskeletal ROS: negative for - joint swelling or muscular weakness Neurological ROS: as noted in HPI Dermatological ROS: negative for rash and skin lesion changes  Physical Examination: Blood pressure (!) 137/97, pulse 83, temperature 97.6 F (36.4 C), temperature source Oral, resp. rate 18, height 5\' 4"  (1.626 m), weight 102.1 kg (225 lb), SpO2 97 %.  HEENT-  Normocephalic, no lesions, without obvious abnormality.  Normal external eye and conjunctiva.  Normal TM's bilaterally.  Normal auditory canals and external ears. Normal external nose, mucus membranes and septum.  Normal pharynx. Cardiovascular- S1, S2 normal, pulses palpable throughout   Lungs- chest clear, no wheezing, rales, normal symmetric air entry Abdomen- soft, non-tender; bowel sounds normal; no masses,  no organomegaly Extremities- no edema Lymph-no adenopathy palpable Musculoskeletal-no joint tenderness, deformity or  swelling Skin-warm and dry, no hyperpigmentation, vitiligo, or suspicious lesions  Neurological Examination   Mental Status: Alert, oriented, thought content appropriate.  Speech fluent without evidence of aphasia.  Able to follow 3 step commands without difficulty. Cranial Nerves: II: Discs flat bilaterally; Visual fields grossly normal, pupils equal, round, reactive to light and accommodation III,IV, VI: ptosis not present, extra-ocular motions intact bilaterally V,VII: smile symmetric, facial light touch sensation normal bilaterally VIII: hearing normal bilaterally IX,X: gag reflex present XI: bilateral shoulder shrug XII: midline tongue extension Motor: Right : Upper extremity   5/5    Left:     Upper extremity   5/5  Lower extremity   5/5     Lower extremity   5/5 Tone and bulk:normal tone throughout; no atrophy noted Sensory: Pinprick and light touch intact throughout, bilaterally Deep Tendon Reflexes: 2+ in the upper extremities and absent in the lower extremities Plantars: Right: downgoing   Left: downgoing Cerebellar: Normal finger-to-nose, normal rapid alternating movements and normal heel-to-shin testing bilaterally Gait: normal gait and station    Laboratory Studies:  Basic Metabolic Panel: Recent Labs  Lab 03/02/18 1610  NA 136  K 3.4*  CL 99*  CO2 29  GLUCOSE  236*  BUN 15  CREATININE 0.89  CALCIUM 8.9    Liver Function Tests: Recent Labs  Lab 03/02/18 1610  AST 26  ALT 18  ALKPHOS 72  BILITOT 1.3*  PROT 7.6  ALBUMIN 4.3   No results for input(s): LIPASE, AMYLASE in the last 168 hours. No results for input(s): AMMONIA in the last 168 hours.  CBC: Recent Labs  Lab 03/02/18 1610  WBC 5.5  NEUTROABS 4.3  HGB 15.2  HCT 44.1  MCV 87.9  PLT 166    Cardiac Enzymes: Recent Labs  Lab 03/02/18 1610  TROPONINI <0.03    BNP: Invalid input(s): POCBNP  CBG: Recent Labs  Lab 03/02/18 1608 03/02/18 2204 03/03/18 0859  GLUCAP 251* 191*  212*    Microbiology: No results found for this or any previous visit.  Coagulation Studies: Recent Labs    03/02/18 1610  LABPROT 12.5  INR 0.94    Urinalysis: No results for input(s): COLORURINE, LABSPEC, PHURINE, GLUCOSEU, HGBUR, BILIRUBINUR, KETONESUR, PROTEINUR, UROBILINOGEN, NITRITE, LEUKOCYTESUR in the last 168 hours.  Invalid input(s): APPERANCEUR  Lipid Panel:    Component Value Date/Time   CHOL 134 03/03/2018 0610   CHOL 170 02/11/2018 1117   TRIG 165 (H) 03/03/2018 0610   HDL 31 (L) 03/03/2018 0610   HDL 34 (L) 02/11/2018 1117   CHOLHDL 4.3 03/03/2018 0610   VLDL 33 03/03/2018 0610   LDLCALC 70 03/03/2018 0610   LDLCALC 99 02/11/2018 1117    HgbA1C:  Lab Results  Component Value Date   HGBA1C 8.5 (H) 03/03/2018    Urine Drug Screen:      Component Value Date/Time   LABOPIA NONE DETECTED 01/24/2015 0520   COCAINSCRNUR NONE DETECTED 01/24/2015 0520   LABBENZ NONE DETECTED 01/24/2015 0520   AMPHETMU NONE DETECTED 01/24/2015 0520   THCU NONE DETECTED 01/24/2015 0520   LABBARB NONE DETECTED 01/24/2015 0520    Alcohol Level:  Recent Labs  Lab 03/02/18 1610  ETH <10    Other results: EKG: sinus rhythm at 84 bpm.  Imaging: Mr Brain Wo Contrast  Result Date: 03/03/2018 CLINICAL DATA:  Initial evaluation for acute slurred speech, dysarthria. History of CHF, CAD, diabetes. EXAM: MRI HEAD WITHOUT CONTRAST MRA HEAD WITHOUT CONTRAST TECHNIQUE: Multiplanar, multiecho pulse sequences of the brain and surrounding structures were obtained without intravenous contrast. Angiographic images of the head were obtained using MRA technique without contrast. COMPARISON:  Prior head CT from 03/02/2018. FINDINGS: MRI HEAD FINDINGS Brain: Study mildly degraded by motion artifact. Cerebral volume within normal limits for age. Mild scattered subcentimeter T2/FLAIR hyperintensities noted within the periventricular, deep, and subcortical white matter both cerebral hemispheres,  nonspecific, but felt to be within normal limits for age. Minimal streaky FLAIR signal abnormality noted within the superior aspect of the left cerebellar hemisphere. No con cord signal abnormality seen within this region on corresponding sequences. Finding is of uncertain etiology, but may be related to prior insult, of doubtful significance as well. No abnormal foci of restricted diffusion to suggest acute or subacute ischemia. Gray-white matter differentiation maintained. No other areas of chronic infarction. No evidence for acute or chronic intracranial hemorrhage. No mass lesion, midline shift or mass effect. No hydrocephalus. No extra-axial fluid collection. Major dural sinuses grossly patent. Pituitary gland suprasellar region normal. Midline structures intact and normal. Vascular: Major intracranial vascular flow voids maintained. Skull and upper cervical spine: Craniocervical junction within normal limits. Upper cervical spine within normal limits. Bone marrow signal intensity within normal limits. No scalp  soft tissue abnormality. Sinuses/Orbits: Globes and orbital soft tissues within normal limits. Paranasal sinuses are clear. Left mastoid effusion noted. Other: None. MRA HEAD FINDINGS ANTERIOR CIRCULATION: Distal cervical segments of the internal carotid arteries are patent with antegrade flow. Petrous, cavernous, and supraclinoid segments widely patent bilaterally. Origin of the ophthalmic arteries patent. ICA termini widely patent. A1 segments, anterior communicating artery common anterior cerebral arteries widely patent bilaterally. No M1 stenosis or occlusion. Distal MCA branches well perfused and symmetric probable mild distal small vessel atheromatous irregularity noted. POSTERIOR CIRCULATION: Visualized vertebral arteries widely patent to the vertebrobasilar junction. Right vertebral artery slightly dominant. Posterior inferior cerebral arteries patent proximally. Basilar artery widely patent to  its distal aspect. Superior cerebellar and posterior cerebral arteries well perfused bilaterally without flow-limiting stenosis. Small left posterior communicating artery noted. Probable distal small vessel atheromatous irregularity noted. No aneurysm. IMPRESSION: MRI HEAD IMPRESSION 1. Negative brain MRI for age. No acute intracranial infarct or other process identified. 2. Left mastoid effusion. MRA HEAD IMPRESSION 1. Negative intracranial MRA. No large vessel occlusion. No hemodynamically significant or correctable stenosis. 2. Mild distal small vessel atheromatous irregularity. Electronically Signed   By: Rise Mu M.D.   On: 03/03/2018 02:19   US Carotid Bilateral (at [facility] And [facility] Only)  Result Date: 03/03/2018 CLINICAL DATA:  47 year old male with history of TIA EXAM: BILATERAL CAROTID DUPLEX ULTRASOUND TECHNIQUE: Wallace Cullens scale imaging, color Doppler and duplex ultrasound were performed of bilateral carotid and vertebral arteries in the neck. COMPARISON:  No prior duplex FINDINGS: Criteria: Quantification of carotid stenosis is based on velocity parameters that correlate the residual internal carotid diameter with NASCET-based stenosis levels, using the diameter of the distal internal carotid lumen as the denominator for stenosis measurement. The following velocity measurements were obtained: RIGHT ICA:  Systolic 72 cm/sec, Diastolic 33 cm/sec CCA:  89 cm/sec SYSTOLIC ICA/CCA RATIO:  0.8 ECA:  93 cm/sec LEFT ICA:  Systolic 74 cm/sec, Diastolic for 30 cm/sec CCA:  83 cm/sec SYSTOLIC ICA/CCA RATIO:  0.9 ECA:  99 cm/sec Right Brachial SBP: Not acquired Left Brachial SBP: Not acquired RIGHT CAROTID ARTERY: No significant calcified disease of the right common carotid artery. Intermediate waveform maintained. Homogeneous plaque without significant calcifications at the right carotid bifurcation. Low resistance waveform of the right ICA. No significant tortuosity. RIGHT VERTEBRAL ARTERY: Antegrade flow  with low resistance waveform. LEFT CAROTID ARTERY: No significant calcified disease of the left common carotid artery. Intermediate waveform maintained. Homogeneous plaque at the left carotid bifurcation without significant calcifications. Low resistance waveform of the left ICA. LEFT VERTEBRAL ARTERY:  Antegrade flow with low resistance waveform. IMPRESSION: Color duplex indicates minimal homogeneous plaque, with no hemodynamically significant stenosis by duplex criteria in the extracranial cerebrovascular circulation. Signed, Yvone Neu. Loreta Ave, DO [facility] Electronically Signed   By: Gilmer Mor D.O.   On: 03/03/2018 08:55   Mr [name] Head/brain ZO Cm  Result Date: 03/03/2018 CLINICAL DATA:  Initial evaluation for acute slurred speech, dysarthria. History of CHF, CAD, diabetes. EXAM: MRI HEAD WITHOUT CONTRAST MRA HEAD WITHOUT CONTRAST TECHNIQUE: Multiplanar, multiecho pulse sequences of the brain and surrounding structures were obtained without intravenous contrast. Angiographic images of the head were obtained using MRA technique without contrast. COMPARISON:  Prior head CT from 03/02/2018. FINDINGS: MRI HEAD FINDINGS Brain: Study mildly degraded by motion artifact. Cerebral volume within normal limits for age. Mild scattered subcentimeter T2/FLAIR hyperintensities noted within the periventricular, deep, and subcortical white matter both cerebral hemispheres, nonspecific,  but felt to be within normal limits for age. Minimal streaky FLAIR signal abnormality noted within the superior aspect of the left cerebellar hemisphere. No con cord signal abnormality seen within this region on corresponding sequences. Finding is of uncertain etiology, but may be related to prior insult, of doubtful significance as well. No abnormal foci of restricted diffusion to suggest acute or subacute ischemia. Gray-white matter differentiation maintained. No other areas of  chronic infarction. No evidence for acute or chronic intracranial hemorrhage. No mass lesion, midline shift or mass effect. No hydrocephalus. No extra-axial fluid collection. Major dural sinuses grossly patent. Pituitary gland suprasellar region normal. Midline structures intact and normal. Vascular: Major intracranial vascular flow voids maintained. Skull and upper cervical spine: Craniocervical junction within normal limits. Upper cervical spine within normal limits. Bone marrow signal intensity within normal limits. No scalp soft tissue abnormality. Sinuses/Orbits: Globes and orbital soft tissues within normal limits. Paranasal sinuses are clear. Left mastoid effusion noted. Other: None. MRA HEAD FINDINGS ANTERIOR CIRCULATION: Distal cervical segments of the internal carotid arteries are patent with antegrade flow. Petrous, cavernous, and supraclinoid segments widely patent bilaterally. Origin of the ophthalmic arteries patent. ICA termini widely patent. A1 segments, anterior communicating artery common anterior cerebral arteries widely patent bilaterally. No M1 stenosis or occlusion. Distal MCA branches well perfused and symmetric probable mild distal small vessel atheromatous irregularity noted. POSTERIOR CIRCULATION: Visualized vertebral arteries widely patent to the vertebrobasilar junction. Right vertebral artery slightly dominant. Posterior inferior cerebral arteries patent proximally. Basilar artery widely patent to its distal aspect. Superior cerebellar and posterior cerebral arteries well perfused bilaterally without flow-limiting stenosis. Small left posterior communicating artery noted. Probable distal small vessel atheromatous irregularity noted. No aneurysm. IMPRESSION: MRI HEAD IMPRESSION 1. Negative brain MRI for age. No acute intracranial infarct or other process identified. 2. Left mastoid effusion. MRA HEAD IMPRESSION 1. Negative intracranial MRA. No large vessel occlusion. No hemodynamically  significant or correctable stenosis. 2. Mild distal small vessel atheromatous irregularity. Electronically Signed   By: Rise Mu M.D.   On: 03/03/2018 02:19   Ct Head Code Stroke Wo Contrast  Result Date: 03/02/2018 CLINICAL DATA:  Code stroke.  Slurred speech EXAM: CT HEAD WITHOUT CONTRAST TECHNIQUE: Contiguous axial images were obtained from the base of the skull through the vertex without intravenous contrast. COMPARISON:  CT head 12/30/2016 FINDINGS: Brain: No evidence of acute infarction, hemorrhage, hydrocephalus, extra-axial collection or mass lesion/mass effect. Vascular: Negative for hyperdense vessel Skull: Negative Sinuses/Orbits: Chronic left mastoid effusion. Remaining sinuses clear. Other: None ASPECTS (Alberta Stroke Program Early CT Score) - Ganglionic level infarction (caudate, lentiform nuclei, internal capsule, insula, M1-M3 cortex): 7 - Supraganglionic infarction (M4-M6 cortex): 3 Total score (0-10 with 10 being normal): 10 IMPRESSION: 1. Negative CT head 2. ASPECTS is 10 3. These results were called by telephone at the time of interpretation on 03/02/2018 at 4:06 pm to Dr. Derrill Kay , who verbally acknowledged these results. Electronically Signed   By: Marlan Palau M.D.   On: 03/02/2018 16:07    Assessment: 47 y.o. male presenting with an episode of weakness, tunnel vision and slurred speech.  Etiology unclear.  TIA on the differential as well as migraine equivalent.  MRI of the brain reviewed and shows no acute changes.  Carotid dopplers show no evidence of hemodynamically significant stenosis.  Echocardiogram pending.  A1c 8.5, LDL 70.  Stroke Risk Factors - diabetes mellitus, hypertension and smoking  Plan: 1. Blood sugar management with target LDL<7.0 2. Continue ASA daily 3.  Telemetry monitoring 4. Frequent neuro checks 5. Smoking cessation counseling 6. Orthostatics 7. If echocardiogram unremarkable no further neurological testing recommended at this  time.   Thana Farr, MD Neurology [phone] 03/03/2018, 12:28 PM

## 2018-03-03 NOTE — Plan of Care (Signed)
  Problem: Ischemic Stroke/TIA Tissue Perfusion: Goal: Complications of ischemic stroke/TIA will be minimized Outcome: Progressing  Smoking Cessation - Nicotine patch in use

## 2018-03-03 NOTE — Evaluation (Signed)
Physical Therapy Evaluation Patient Details Name: Jawanza Zambito MRN: [id number] DOB: 03-24-1971 Today's Date: 03/03/2018   History of Present Illness  Pt admitted for TIA with complaints of slurred speech and weakness. HIstory includes CHF, CAD, DM, and HTN. Of note, all symptoms resolved at this time.  Clinical Impression  Pt is a pleasant 47 year old male who was admitted for TIA. Requested to evaluate to determine needs for dc. Pt demonstrates all bed mobility/transfers/ambulation at baseline level. Sensation/cognition/coordination WNL as noted with testing. Pt reports he feels at baseline level. Pt does not require any further PT needs at this time. Pt will be dc in house and does not require follow up. RN aware. Will dc current orders.      Follow Up Recommendations No PT follow up    Equipment Recommendations  None recommended by PT    Recommendations for Other Services       Precautions / Restrictions Precautions Precautions: None Restrictions Weight Bearing Restrictions: No      Mobility  Bed Mobility Overal bed mobility: Independent             General bed mobility comments: ease with transition  Transfers Overall transfer level: Independent Equipment used: None             General transfer comment: safe technique, no LOB noted  Ambulation/Gait Ambulation/Gait assistance: Independent Ambulation Distance (Feet): 80 Feet Assistive device: None Gait Pattern/deviations: WFL(Within Functional Limits)     General Gait Details: ambulated 2 laps in room, declined to ambulate in hallway. Safe and normal gait speed noted. No LOB or concerns  Stairs            Wheelchair Mobility    Modified Rankin (Stroke Patients Only)       Balance Overall balance assessment: Independent                                           Pertinent Vitals/Pain Pain Assessment: No/denies pain    Home Living Family/patient expects to be  discharged to:: Private residence Living Arrangements: Spouse/significant other   Type of Home: House         Home Equipment: None      Prior Function Level of Independence: Independent         Comments: active, working full time on [employer]        Extremity/Trunk Assessment   Upper Extremity Assessment Upper Extremity Assessment: Overall WFL for tasks assessed    Lower Extremity Assessment Lower Extremity Assessment: Overall WFL for tasks assessed       Communication   Communication: No difficulties  Cognition Arousal/Alertness: Awake/alert Behavior During Therapy: WFL for tasks assessed/performed Overall Cognitive Status: Within Functional Limits for tasks assessed                                        General Comments      Exercises     Assessment/Plan    PT Assessment Patent does not need any further PT services  PT Problem List         PT Treatment Interventions      PT Goals (Current goals can be found in the Care Plan section)  Acute Rehab PT Goals Patient Stated Goal: to  go home PT Goal Formulation: With patient Time For Goal Achievement: 03/03/18 Potential to Achieve Goals: Good    Frequency     Barriers to discharge        Co-evaluation               AM-PAC PT "6 Clicks" Daily Activity  Outcome Measure Difficulty turning over in bed (including adjusting bedclothes, sheets and blankets)?: None Difficulty moving from lying on back to sitting on the side of the bed? : None Difficulty sitting down on and standing up from a chair with arms (e.g., wheelchair, bedside commode, etc,.)?: None Help needed moving to and from a bed to chair (including a wheelchair)?: None Help needed walking in hospital room?: None Help needed climbing 3-5 steps with a railing? : None 6 Click Score: 24    End of Session   Activity Tolerance: Patient tolerated treatment well Patient left: in bed;with  family/visitor present Nurse Communication: Mobility status PT Visit Diagnosis: Muscle weakness (generalized) (M62.81)    Time: 5427-0623 PT Time Calculation (min) (ACUTE ONLY): 8 min   Charges:   PT Evaluation $PT Eval Low Complexity: 1 Low     PT G CodesElizabeth Palau, PT, DPT [phone]   Upton Russey 03/03/2018, 11:54 AM

## 2018-03-03 NOTE — Progress Notes (Signed)
SLP Cancellation Note  Patient Details Name: Keith [name] MRN: [id number] DOB: 1971-10-26   Cancelled treatment:       Reason Eval/Treat Not Completed: SLP screened, no needs identified, will sign off(chart reviewed; consulted NSG, MD and Rehab staff.) Pt denied any difficulty swallowing to MD and is currently on a regular diet; tolerates swallowing pills w/ water per report. Pt conversed at conversational level w/out deficits noted w/ Rehab staff, MD; pt and family denied any speech-language deficits.  No further skilled ST services indicated as pt appears at his baseline. Pt agreed. NSG to reconsult if any change in status.     Jerilynn Som, MS, CCC-SLP Watson,Katherine 03/03/2018, 10:03 AM

## 2018-03-03 NOTE — Progress Notes (Signed)
*  PRELIMINARY RESULTS* Echocardiogram 2D Echocardiogram has been performed.  Joanette Gula Darcelle Herrada 03/03/2018, 3:18 PM

## 2018-03-03 NOTE — Progress Notes (Signed)
Inpatient Diabetes Program Recommendations  AACE/ADA: New Consensus Statement on Inpatient Glycemic Control (2015)  Target Ranges:  Prepandial:   less than 140 mg/dL      Peak postprandial:   less than 180 mg/dL (1-2 hours)      Critically ill patients:  140 - 180 mg/dL   Lab Results  Component Value Date   GLUCAP 212 (H) 03/03/2018   HGBA1C 8.5 (H) 03/03/2018    Review of Glycemic Control Results for Keith [name], Keith [name] (MRN [id number]) as of 03/03/2018 11:47  Ref. Range 03/02/2018 16:08 03/02/2018 22:04 03/03/2018 08:59  Glucose-Capillary Latest Ref Range: 65 - 99 mg/dL 078 (H) 675 (H) 449 (H)   Diabetes history: DM2 Outpatient Diabetes medications: Metformin 1 gm bid Current orders for Inpatient glycemic control: Novolog sensitive correction scale tid + hs  Inpatient Diabetes Program Recommendations:   Consider while off of oral meds if CBGs continue elevated & in the hospital: -Lantus 20 units qd (0.2 units/kg x 102.1 kg)  Thank you, Billy Fischer. Harneet Noblett, RN, MSN, CDE  Diabetes Coordinator Inpatient Glycemic Control Team Team Pager [phone] (8am-5pm) 03/03/2018 11:50 AM

## 2018-03-04 ENCOUNTER — Telehealth: Payer: Self-pay | Admitting: Cardiovascular Disease

## 2018-03-04 ENCOUNTER — Other Ambulatory Visit: Payer: Self-pay

## 2018-03-04 DIAGNOSIS — I513 Intracardiac thrombosis, not elsewhere classified: Secondary | ICD-10-CM

## 2018-03-04 LAB — HIV ANTIBODY (ROUTINE TESTING W REFLEX): HIV SCREEN 4TH GENERATION: NONREACTIVE

## 2018-03-04 LAB — GLUCOSE, CAPILLARY: GLUCOSE-CAPILLARY: 161 mg/dL — AB (ref 65–99)

## 2018-03-04 NOTE — Telephone Encounter (Signed)
[name], MD  Shon Baton, RN  Cc: [name]        This patient was hospitalized with possible TIA. He had an echo done and could not exclude apical thrombus. He is being discharged home today. Schedule him for a limited echo with [facility] (in our office) to exclude apical thrombus and follow-up with me in few weeks.    Order placed.  Routed to Support Pool to contact patient for echo next week and follow up with Arida in 2-3 weeks.

## 2018-03-04 NOTE — Progress Notes (Signed)
[facility] [place] at [facility] Keith [name] was admitted to the Hospital on 03/02/2018 and Discharged  03/04/2018 and should be excused from work/school   for the days starting 03/02/2018 , may return to work/school without any restrictions from 03/06/2018  Call Ihor Austin MD with questions.  Ihor Austin M.D on 03/04/2018,at 9:32 AM  [facility] [place] at [facility]  [phone]

## 2018-03-04 NOTE — Plan of Care (Signed)
Pt is d/ced home.  MRI negative for stroke.  Pt provided information on quitting smoking.  Told him he is at high risk for stroke w/HTN, DM and smoking.  Pt also experiencing a lot of stress at work.  Reviewed d/c instructions w/patient and wife.  IV removed by nurse.  Pt will transport home w/wife.

## 2018-03-06 NOTE — Discharge Summary (Signed)
Keith [name], is a 47 y.o. male  DOB 1971-08-06  MRN [id number].  Admission date:  03/02/2018  Admitting Physician  [name], MD  Discharge Date:  03/04/2018   Primary MD  [name], PA-C  Recommendations for primary care physician for things to follow:   Follow with PCP in 1 week   Admission Diagnosis   Transient ischemic attack Chronic congestive heart failure Diabetes mellitus type 2 Hypertension  Discharge Diagnosis  Slurred speech [R47.81]   Active Problems:   TIA (transient ischemic attack) Chronic congestive heart failure Coronary artery disease Type 2 diabetes mellitus Hypertension          Past Medical History:  Diagnosis Date  . CHF (congestive heart failure) (HCC)   . Coronary artery disease   . Diabetes mellitus   . Frequent headaches   . Hypertension          Past Surgical History:  Procedure Laterality Date  . COLONOSCOPY WITH PROPOFOL N/A 02/19/2017   Procedure: COLONOSCOPY WITH PROPOFOL;  Surgeon: [name], MD;  Location: [facility] ENDOSCOPY;  Service: Endoscopy;  Laterality: N/A;  . ESOPHAGOGASTRODUODENOSCOPY (EGD) WITH PROPOFOL N/A 02/19/2017   Procedure: ESOPHAGOGASTRODUODENOSCOPY (EGD) WITH PROPOFOL;  Surgeon: [name], MD;  Location: [facility] ENDOSCOPY;  Service: Endoscopy;  Laterality: N/A;  . LEFT HEART CATH AND CORONARY ANGIOGRAPHY N/A 07/11/2017   Procedure: LEFT HEART CATH AND CORONARY ANGIOGRAPHY;  Surgeon: [name], MD;  Location: [facility] INVASIVE CV LAB;  Service: Cardiovascular;  Laterality: N/A;       History of present illness and  Hospital Course:     Kindly see H&P for history of present illness and admission details, please review complete Labs, Consult reports and Test reports for all details in brief  HPI  from the history and physical done on the day of admission 47 year old male patient with history of chronic systolic heart failure, CAD, diabetes mellitus type 2, hypertension came in  because of slurred speech, patient had slurred speech yesterday afternoon from the fall number 3:30 PM.  Admitted for evaluation of TIA.    Hospital Course  #1 transient slurred speech without focal weakness.  Patient felt generalized weakness yesterday.  And he feels completely normal, his slurred speech resolved.  Initial head CT normal, patient had MRI of the brain which did not show any acute strokes.  MRA of the brain is negative for any vessel occlusion.  Ultrasound of carotids did not show any hemodynamically significant stenosis.  Echocardiogram results are pending.  Patient already on aspirin, statin at home.  His LDL is 70.  Seen by physical therapy, speech therapy did not recommend any services.  Patient did well.  #2. hyperlipidemia: Continue statins 3.  History of chronic systolic heart failure,; appears euvolemic.  Continue Lasix 40 mg daily, Coreg, KCl, spironolactone.  35-40% by echocardiogram done in August 2018. 4.  Diabetes mellitus type 2: Patient is on metformin.  Uncontrolled.   Discharge Condition: Stable   Follow UP With primary care physician in the clinic With cardiology in the clinic Outpatient echocardiogram high definitive to assess for any thrombus.  Dr. Kirke Corin will arrange it.     Discharge Instructions  and  Discharge Medications   Stable       Allergies as of 03/03/2018      Reactions   Other Other (See Comments)   Muscle Relaxers (caused psychotic break)      Medication List    TAKE these medications   aspirin 81 MG  chewable tablet Chew 1 tablet (81 mg total) by mouth daily.   atorvastatin 40 MG tablet Commonly known as:  LIPITOR Take 1 tablet (40 mg total) by mouth daily at 6 PM.   carvedilol 6.25 MG tablet Commonly known as:  COREG Take 1 tablet (6.25 mg total) by mouth 2 (two) times daily with a meal.   furosemide 40 MG tablet Commonly known as:  LASIX Take 1 tablet (40 mg total) by mouth daily.   losartan 100 MG  tablet Commonly known as:  COZAAR Take 1 tablet (100 mg total) by mouth daily.   metFORMIN 500 MG tablet Commonly known as:  GLUCOPHAGE Take 500mg  daily x 1 wk. Then take 500 mg twice daily x 1 wk. Then take 1000 mg in the morning and 500 mg at night x 1 wk. Then take 1000 mg twice daily x 1 wk.   Omeprazole 20 MG Tbec Take 2 tablets (40 mg total) by mouth daily.   potassium chloride SA 20 MEQ tablet Commonly known as:  K-DUR,KLOR-CON Take 1 tablet (20 mEq total) by mouth daily.   spironolactone 25 MG tablet Commonly known as:  ALDACTONE Take 1 tablet (25 mg total) by mouth daily.         Diet and Activity recommendation: See Discharge Instructions above   Consults obtained -neurology   Major procedures and Radiology Reports - PLEASE review detailed and final reports for all details, in brief -     ImagingResults  Mr Brain Wo Contrast  Result Date: 03/03/2018 CLINICAL DATA:  Initial evaluation for acute slurred speech, dysarthria. History of CHF, CAD, diabetes. EXAM: MRI HEAD WITHOUT CONTRAST MRA HEAD WITHOUT CONTRAST TECHNIQUE: Multiplanar, multiecho pulse sequences of the brain and surrounding structures were obtained without intravenous contrast. Angiographic images of the head were obtained using MRA technique without contrast. COMPARISON:  Prior head CT from 03/02/2018. FINDINGS: MRI HEAD FINDINGS Brain: Study mildly degraded by motion artifact. Cerebral volume within normal limits for age. Mild scattered subcentimeter T2/FLAIR hyperintensities noted within the periventricular, deep, and subcortical white matter both cerebral hemispheres, nonspecific, but felt to be within normal limits for age. Minimal streaky FLAIR signal abnormality noted within the superior aspect of the left cerebellar hemisphere. No con cord signal abnormality seen within this region on corresponding sequences. Finding is of uncertain etiology, but may be related to prior insult, of  doubtful significance as well. No abnormal foci of restricted diffusion to suggest acute or subacute ischemia. Gray-white matter differentiation maintained. No other areas of chronic infarction. No evidence for acute or chronic intracranial hemorrhage. No mass lesion, midline shift or mass effect. No hydrocephalus. No extra-axial fluid collection. Major dural sinuses grossly patent. Pituitary gland suprasellar region normal. Midline structures intact and normal. Vascular: Major intracranial vascular flow voids maintained. Skull and upper cervical spine: Craniocervical junction within normal limits. Upper cervical spine within normal limits. Bone marrow signal intensity within normal limits. No scalp soft tissue abnormality. Sinuses/Orbits: Globes and orbital soft tissues within normal limits. Paranasal sinuses are clear. Left mastoid effusion noted. Other: None. MRA HEAD FINDINGS ANTERIOR CIRCULATION: Distal cervical segments of the internal carotid arteries are patent with antegrade flow. Petrous, cavernous, and supraclinoid segments widely patent bilaterally. Origin of the ophthalmic arteries patent. ICA termini widely patent. A1 segments, anterior communicating artery common anterior cerebral arteries widely patent bilaterally. No M1 stenosis or occlusion. Distal MCA branches well perfused and symmetric probable mild distal small vessel atheromatous irregularity noted. POSTERIOR CIRCULATION: Visualized vertebral arteries widely patent  to the vertebrobasilar junction. Right vertebral artery slightly dominant. Posterior inferior cerebral arteries patent proximally. Basilar artery widely patent to its distal aspect. Superior cerebellar and posterior cerebral arteries well perfused bilaterally without flow-limiting stenosis. Small left posterior communicating artery noted. Probable distal small vessel atheromatous irregularity noted. No aneurysm. IMPRESSION: MRI HEAD IMPRESSION 1. Negative brain MRI for age. No acute  intracranial infarct or other process identified. 2. Left mastoid effusion. MRA HEAD IMPRESSION 1. Negative intracranial MRA. No large vessel occlusion. No hemodynamically significant or correctable stenosis. 2. Mild distal small vessel atheromatous irregularity. Electronically Signed   By: Rise Mu M.D.   On: 03/03/2018 02:19   US Carotid Bilateral (at [facility] And [facility] Only)  Result Date: 03/03/2018 CLINICAL DATA:  47 year old male with history of TIA EXAM: BILATERAL CAROTID DUPLEX ULTRASOUND TECHNIQUE: Wallace Cullens scale imaging, color Doppler and duplex ultrasound were performed of bilateral carotid and vertebral arteries in the neck. COMPARISON:  No prior duplex FINDINGS: Criteria: Quantification of carotid stenosis is based on velocity parameters that correlate the residual internal carotid diameter with NASCET-based stenosis levels, using the diameter of the distal internal carotid lumen as the denominator for stenosis measurement. The following velocity measurements were obtained: RIGHT ICA:  Systolic 72 cm/sec, Diastolic 33 cm/sec CCA:  89 cm/sec SYSTOLIC ICA/CCA RATIO:  0.8 ECA:  93 cm/sec LEFT ICA:  Systolic 74 cm/sec, Diastolic for 30 cm/sec CCA:  83 cm/sec SYSTOLIC ICA/CCA RATIO:  0.9 ECA:  99 cm/sec Right Brachial SBP: Not acquired Left Brachial SBP: Not acquired RIGHT CAROTID ARTERY: No significant calcified disease of the right common carotid artery. Intermediate waveform maintained. Homogeneous plaque without significant calcifications at the right carotid bifurcation. Low resistance waveform of the right ICA. No significant tortuosity. RIGHT VERTEBRAL ARTERY: Antegrade flow with low resistance waveform. LEFT CAROTID ARTERY: No significant calcified disease of the left common carotid artery. Intermediate waveform maintained. Homogeneous plaque at the left carotid bifurcation without significant calcifications. Low resistance waveform of the left ICA. LEFT VERTEBRAL ARTERY:  Antegrade flow with  low resistance waveform. IMPRESSION: Color duplex indicates minimal homogeneous plaque, with no hemodynamically significant stenosis by duplex criteria in the extracranial cerebrovascular circulation. Signed, Yvone Neu. Loreta Ave, DO [name] Electronically Signed   By: Gilmer Mor D.O.   On: 03/03/2018 08:55   Mr [name] Head/brain ZO Cm  Result Date: 03/03/2018 CLINICAL DATA:  Initial evaluation for acute slurred speech, dysarthria. History of CHF, CAD, diabetes. EXAM: MRI HEAD WITHOUT CONTRAST MRA HEAD WITHOUT CONTRAST TECHNIQUE: Multiplanar, multiecho pulse sequences of the brain and surrounding structures were obtained without intravenous contrast. Angiographic images of the head were obtained using MRA technique without contrast. COMPARISON:  Prior head CT from 03/02/2018. FINDINGS: MRI HEAD FINDINGS Brain: Study mildly degraded by motion artifact. Cerebral volume within normal limits for age. Mild scattered subcentimeter T2/FLAIR hyperintensities noted within the periventricular, deep, and subcortical white matter both cerebral hemispheres, nonspecific, but felt to be within normal limits for age. Minimal streaky FLAIR signal abnormality noted within the superior aspect of the left cerebellar hemisphere. No con cord signal abnormality seen within this region on corresponding sequences. Finding is of uncertain etiology, but may be related to prior insult, of doubtful significance as well. No abnormal foci of restricted diffusion to suggest acute or subacute ischemia. Gray-white matter differentiation maintained. No other areas of chronic infarction. No evidence for acute or chronic intracranial hemorrhage. No mass lesion, midline shift or mass effect. No hydrocephalus. No extra-axial fluid collection.  Major dural sinuses grossly patent. Pituitary gland suprasellar region normal. Midline structures intact and normal. Vascular: Major intracranial  vascular flow voids maintained. Skull and upper cervical spine: Craniocervical junction within normal limits. Upper cervical spine within normal limits. Bone marrow signal intensity within normal limits. No scalp soft tissue abnormality. Sinuses/Orbits: Globes and orbital soft tissues within normal limits. Paranasal sinuses are clear. Left mastoid effusion noted. Other: None. MRA HEAD FINDINGS ANTERIOR CIRCULATION: Distal cervical segments of the internal carotid arteries are patent with antegrade flow. Petrous, cavernous, and supraclinoid segments widely patent bilaterally. Origin of the ophthalmic arteries patent. ICA termini widely patent. A1 segments, anterior communicating artery common anterior cerebral arteries widely patent bilaterally. No M1 stenosis or occlusion. Distal MCA branches well perfused and symmetric probable mild distal small vessel atheromatous irregularity noted. POSTERIOR CIRCULATION: Visualized vertebral arteries widely patent to the vertebrobasilar junction. Right vertebral artery slightly dominant. Posterior inferior cerebral arteries patent proximally. Basilar artery widely patent to its distal aspect. Superior cerebellar and posterior cerebral arteries well perfused bilaterally without flow-limiting stenosis. Small left posterior communicating artery noted. Probable distal small vessel atheromatous irregularity noted. No aneurysm. IMPRESSION: MRI HEAD IMPRESSION 1. Negative brain MRI for age. No acute intracranial infarct or other process identified. 2. Left mastoid effusion. MRA HEAD IMPRESSION 1. Negative intracranial MRA. No large vessel occlusion. No hemodynamically significant or correctable stenosis. 2. Mild distal small vessel atheromatous irregularity. Electronically Signed   By: Rise Mu M.D.   On: 03/03/2018 02:19   Ct Head Code Stroke Wo Contrast  Result Date: 03/02/2018 CLINICAL DATA:  Code stroke.  Slurred speech EXAM: CT HEAD WITHOUT CONTRAST TECHNIQUE:  Contiguous axial images were obtained from the base of the skull through the vertex without intravenous contrast. COMPARISON:  CT head 12/30/2016 FINDINGS: Brain: No evidence of acute infarction, hemorrhage, hydrocephalus, extra-axial collection or mass lesion/mass effect. Vascular: Negative for hyperdense vessel Skull: Negative Sinuses/Orbits: Chronic left mastoid effusion. Remaining sinuses clear. Other: None ASPECTS (Alberta Stroke Program Early CT Score) - Ganglionic level infarction (caudate, lentiform nuclei, internal capsule, insula, M1-M3 cortex): 7 - Supraganglionic infarction (M4-M6 cortex): 3 Total score (0-10 with 10 being normal): 10 IMPRESSION: 1. Negative CT head 2. ASPECTS is 10 3. These results were called by telephone at the time of interpretation on 03/02/2018 at 4:06 pm to Dr. Derrill Kay , who verbally acknowledged these results. Electronically Signed   By: Marlan Palau M.D.   On: 03/02/2018 16:07     Micro Results    No results found for this or any previous visit (from the past 240 hour(s)).     Today  Patient seen and evaluated on the day of discharge No slurred speech No weakness in any part of the body No tingling or numbness Subjective:   Keith [name] today has no headache,no chest abdominal pain,no new weakness tingling or numbness, feels much better wants to go home today.   Objective:   Blood pressure (!) 137/97, pulse 83, temperature 97.6 F (36.4 C), temperature source Oral, resp. rate 18, height 5\' 4"  (1.626 m), weight 102.1 kg (225 lb), SpO2 97 %.  No intake or output data in the 24 hours ending 03/03/18 1153  Exam Awake Alert, Oriented x 3, No new F.N deficits, Normal affect [place].AT,PERRAL Supple Neck,No JVD, No cervical lymphadenopathy appriciated.  Symmetrical Chest wall movement, Good air movement bilaterally, CTAB RRR,No Gallops,Rubs or new Murmurs, No Parasternal Heave +ve B.Sounds, Abd Soft, Non tender, No organomegaly appriciated, No  rebound -guarding or rigidity. No  Cyanosis, Clubbing or edema, No new Rash or bruise  Data Review   CBC w Diff:  Labs(Brief)       Lab Results  Component Value Date   WBC 5.5 03/02/2018   HGB 15.2 03/02/2018   HGB 16.1 02/11/2018   HCT 44.1 03/02/2018   HCT 46.0 02/11/2018   PLT 166 03/02/2018   PLT 186 02/11/2018   LYMPHOPCT 13 03/02/2018   MONOPCT 7 03/02/2018   EOSPCT 2 03/02/2018   BASOPCT 0 03/02/2018      CMP:  Labs(Brief)       Lab Results  Component Value Date   NA 136 03/02/2018   NA 138 02/11/2018   K 3.4 (L) 03/02/2018   CL 99 (L) 03/02/2018   CO2 29 03/02/2018   BUN 15 03/02/2018   BUN 9 02/11/2018   CREATININE 0.89 03/02/2018   CREATININE 0.72 12/14/2016   PROT 7.6 03/02/2018   PROT 6.9 02/11/2018   ALBUMIN 4.3 03/02/2018   ALBUMIN 4.4 02/11/2018   BILITOT 1.3 (H) 03/02/2018   BILITOT 1.2 02/11/2018   ALKPHOS 72 03/02/2018   AST 26 03/02/2018   ALT 18 03/02/2018    .   Total Time in preparing paper work, data evaluation and todays exam - 36 minutes

## 2018-03-08 NOTE — Telephone Encounter (Signed)
Lmov for patient to call back ° °

## 2018-03-11 NOTE — Telephone Encounter (Signed)
Lmov for patient to call back ° °

## 2018-03-15 NOTE — Telephone Encounter (Signed)
LMOV for patient to call back 

## 2018-03-15 NOTE — Telephone Encounter (Signed)
Lmov for patient to call back ° °

## 2018-03-17 ENCOUNTER — Encounter: Payer: Self-pay | Admitting: Physician Assistant

## 2018-03-17 NOTE — Telephone Encounter (Signed)
Sent letter

## 2018-03-18 ENCOUNTER — Ambulatory Visit: Payer: Managed Care, Other (non HMO) | Admitting: Physician Assistant

## 2018-03-18 VITALS — BP 150/92 | HR 84 | Temp 98.2°F | Resp 16 | Wt 223.0 lb

## 2018-03-18 DIAGNOSIS — Z09 Encounter for follow-up examination after completed treatment for conditions other than malignant neoplasm: Secondary | ICD-10-CM

## 2018-03-18 DIAGNOSIS — I200 Unstable angina: Secondary | ICD-10-CM | POA: Diagnosis not present

## 2018-03-18 DIAGNOSIS — E1159 Type 2 diabetes mellitus with other circulatory complications: Secondary | ICD-10-CM | POA: Diagnosis not present

## 2018-03-18 DIAGNOSIS — I5021 Acute systolic (congestive) heart failure: Secondary | ICD-10-CM | POA: Diagnosis not present

## 2018-03-18 DIAGNOSIS — E118 Type 2 diabetes mellitus with unspecified complications: Secondary | ICD-10-CM | POA: Diagnosis not present

## 2018-03-18 DIAGNOSIS — I10 Essential (primary) hypertension: Secondary | ICD-10-CM | POA: Diagnosis not present

## 2018-03-18 DIAGNOSIS — I2109 ST elevation (STEMI) myocardial infarction involving other coronary artery of anterior wall: Secondary | ICD-10-CM

## 2018-03-18 DIAGNOSIS — G459 Transient cerebral ischemic attack, unspecified: Secondary | ICD-10-CM

## 2018-03-18 DIAGNOSIS — I152 Hypertension secondary to endocrine disorders: Secondary | ICD-10-CM

## 2018-03-18 MED ORDER — LOSARTAN POTASSIUM 100 MG PO TABS: 100.0000 mg | ORAL_TABLET | Freq: Every day | ORAL | 0 refills | Status: DC

## 2018-03-18 MED ORDER — SPIRONOLACTONE 25 MG PO TABS: 25.0000 mg | ORAL_TABLET | Freq: Every day | ORAL | 3 refills | Status: DC

## 2018-03-18 MED ORDER — CARVEDILOL 6.25 MG PO TABS: 6.2500 mg | ORAL_TABLET | Freq: Two times a day (BID) | ORAL | 3 refills | Status: DC

## 2018-03-18 MED ORDER — ATORVASTATIN CALCIUM 40 MG PO TABS: 40.0000 mg | ORAL_TABLET | Freq: Every day | ORAL | 0 refills | Status: DC

## 2018-03-18 NOTE — Progress Notes (Signed)
Keith [name]  MRN: [id number] DOB: May 01, 1971  Subjective:  HPI   The patient is a 47 year old male who presents today for a hospital follow up for a TIA. This patient has a history significant for HTN, uncontrolled DM, tiA, migraine with aura, unstable angina, systolic heart failure. On 03/02/2018 he called this clinic with fatigue and difficulty with word finding. He was advised to go to the ER. His CT head was negative but telemetry neurology recommended admission for complete stroke workup. MRI brain was normal. Ultrasound of carotids did not show hemodynamically significant stenosis. Echo showed left ventricular enlargement. Severe apical hypokinesis if not akinesis and unable to rule out apical thrombus. He was discharged 03/04/2018 with recommendations to follow up with PCP.  Since then he reports being back at work. The status of his health issues are below.  TIA: No acute findings on imaging. Patient has not started Lipitor, see below. He is taking 81 mg ASA.  Apical Thrombus: This was unable to be excluded from his most recent echo. Per EMR review, Dr. Kirke Corin has reviewed his echo and recommended repeat echo. Cardiology has called multiple times without contact and a letter has been sent out. Patient says he doesn't understand why he has to have another echo and that he is very busy.  DMII: The patient had his A1C on his last visit and it was 9.2.  He was instructed to titrate his Metformin up to 1000 mg twice daily.  He was also instructed to check his glucose and bring a record with him today. He has increased his medicine but he has not started checking his glucose.  He states he had a lot going on recently and just hasn't gotten into a habit of checking it, he's never been in the habit of checking it.  HTN-The patient is also here to have his blood pressure re-evaluated.  He does not check his blood pressure outside of the office but states his reading today is good for him.  He  reports he is only taking Lasix and Losartan. Continues to report that his cardiologist took him off all medications except for one, the Lasix.  BP Readings from Last 3 Encounters:  03/18/18 (!) 150/92  03/04/18 (!) 143/92  02/11/18 (!) 160/102   Hyperlipidemia-The patient was instructed on his last visit to start his Atorvastatin.  He states he had forgotten to get the prescription from the pharmacy.  When he did remember to go get the medicine the pharmacy had reshelved it and told him he would need a new prescription sent in.   The patient was instructed to make an appointment with his eye doctor and cardiologist.  He states he has these appointments scheduled for later this month.  Patient Active Problem List   Diagnosis Date Noted  . Acute systolic heart failure (HCC) 07/11/2017  . Unstable angina (HCC)   . Other chest pain   . Migraine aura, persistent, with status migrainosus   . Hypertension associated with diabetes (HCC) 01/23/2015  . Slurred speech 01/23/2015  . Headache 01/23/2015  . DM (diabetes mellitus) with complications (HCC) 01/23/2015  . TIA (transient ischemic attack) 01/23/2015    Past Medical History:  Diagnosis Date  . CHF (congestive heart failure) (HCC)   . Coronary artery disease   . Diabetes mellitus   . Frequent headaches   . Hypertension     Social History   Socioeconomic History  . Marital status: Married    Spouse  name: Not on file  . Number of children: Not on file  . Years of education: Not on file  . Highest education level: Not on file  Occupational History  . Not on file  Social Needs  . Financial resource strain: Not on file  . Food insecurity:    Worry: Not on file    Inability: Not on file  . Transportation needs:    Medical: Not on file    Non-medical: Not on file  Tobacco Use  . Smoking status: Current Every Day Smoker    Packs/day: 1.00    Types: Cigarettes  . Smokeless tobacco: Never Used  Substance and Sexual Activity    . Alcohol use: Yes    Comment: Occasionally   . Drug use: No  . Sexual activity: Yes  Lifestyle  . Physical activity:    Days per week: Not on file    Minutes per session: Not on file  . Stress: Not on file  Relationships  . Social connections:    Talks on phone: Not on file    Gets together: Not on file    Attends religious service: Not on file    Active member of club or organization: Not on file    Attends meetings of clubs or organizations: Not on file    Relationship status: Not on file  . Intimate partner violence:    Fear of current or ex partner: Not on file    Emotionally abused: Not on file    Physically abused: Not on file    Forced sexual activity: Not on file  Other Topics Concern  . Not on file  Social History Narrative  . Not on file    Outpatient Encounter Medications as of 03/18/2018  Medication Sig  . aspirin 81 MG chewable tablet Chew 1 tablet (81 mg total) by mouth daily.  . furosemide (LASIX) 40 MG tablet Take 1 tablet (40 mg total) by mouth daily.  [name] Kitchen losartan (COZAAR) 100 MG tablet Take 1 tablet (100 mg total) by mouth daily.  . metFORMIN (GLUCOPHAGE) 500 MG tablet Take 500mg  daily x 1 wk. Then take 500 mg twice daily x 1 wk. Then take 1000 mg in the morning and 500 mg at night x 1 wk. Then take 1000 mg twice daily x 1 wk.  . atorvastatin (LIPITOR) 40 MG tablet Take 1 tablet (40 mg total) by mouth daily at 6 PM. (Patient not taking: Reported on 09/24/2017)  . carvedilol (COREG) 6.25 MG tablet Take 1 tablet (6.25 mg total) by mouth 2 (two) times daily with a meal. (Patient not taking: Reported on 03/02/2018)  . Omeprazole 20 MG TBEC Take 2 tablets (40 mg total) by mouth daily. (Patient not taking: Reported on 09/24/2017)  . potassium chloride SA (K-DUR,KLOR-CON) 20 MEQ tablet Take 1 tablet (20 mEq total) by mouth daily. (Patient not taking: Reported on 09/24/2017)  . spironolactone (ALDACTONE) 25 MG tablet Take 1 tablet (25 mg total) by mouth daily. (Patient not  taking: Reported on 09/24/2017)   No facility-administered encounter medications on file as of 03/18/2018.     Allergies  Allergen Reactions  . Other Other (See Comments)    Muscle Relaxers (caused psychotic break)    Review of Systems  Constitutional: Negative for fever and malaise/fatigue.  Respiratory: Positive for cough (improving). Negative for sputum production, shortness of breath and wheezing.   Cardiovascular: Negative for chest pain, palpitations, orthopnea, claudication and leg swelling.  Genitourinary: Positive for frequency.  Neurological:  Negative for dizziness and headaches.  Endo/Heme/Allergies: Negative for polydipsia.    Objective:  BP (!) 150/92 (BP Location: Right Arm, Patient Position: Sitting, Cuff Size: Normal)   Pulse 84   Temp 98.2 F (36.8 C) (Oral)   Resp 16   Wt 223 lb (101.2 kg)   BMI 38.28 kg/m   Physical Exam  Constitutional: He is oriented to person, place, and time and well-developed, well-nourished, and in no distress.  Cardiovascular: Normal rate and regular rhythm.  Pulmonary/Chest: Effort normal and breath sounds normal.  Neurological: He is alert and oriented to person, place, and time.  Skin: Skin is warm and dry.  Psychiatric: Affect and judgment normal.    Assessment and Plan :  1. Hospital discharge follow-up  I have reviewed the notes capturing his most recent hospitalization. I have reviewed all imaging and labwork performed during most recent hospitalization. I have reconciled the hospital discharge medication list with his current medication list.   2. TIA (transient ischemic attack)  No acute findings on head imaging.  3. Unstable angina (HCC)   4. Hypertension associated with diabetes (HCC)  Uncontrolled. There seems to be a very large disconnect between what is communicated and what the patient understands and does. I have very explicitly directed patient at last visit to restart his coreg. I have also reviewed his  cardiology notes and instruction thoroughly. He continues to insist his cardiologist took him off all BP medications except for Lasix and I simply do not find this to be the case. I have explained to him that this is not the standard of care post-MI and I do not see why or where they did this, but he persists with this idea. I have refilled his BP medications to restart.  - carvedilol (COREG) 6.25 MG tablet; Take 1 tablet (6.25 mg total) by mouth 2 (two) times daily with a meal.  Dispense: 60 tablet; Refill: 3 - atorvastatin (LIPITOR) 40 MG tablet; Take 1 tablet (40 mg total) by mouth daily at 6 PM.  Dispense: 90 tablet; Refill: 0 - spironolactone (ALDACTONE) 25 MG tablet; Take 1 tablet (25 mg total) by mouth daily.  Dispense: 90 tablet; Refill: 3 - losartan (COZAAR) 100 MG tablet; Take 1 tablet (100 mg total) by mouth daily.  Dispense: 90 tablet; Refill: 0  5. Acute systolic heart failure (HCC)  Patient has a VERY poor understanding of his chronic conditions. He says everything was normal at the hospital except for "something that was supposed to be 60 and was 30," referring to his ejection fraction. I have gone over him that this is his heart failure and that this is not getting better, and it is certainly not getting better without complying with BP medications, statins, and other recommendations. He needs to begin his statin. He needs to take his BP medications. He says he has trouble remembering and I have asked him what I can do to help. He says he has a pill box now and that should help. I have communicated to him that cardiology called him multiple times about scheduling another echo, he doesn't see why he has to do this. I have instructed him to call cardiology, have written this on his AVS, and underlined it. I am forwarding this to his cardiology team so they know that patient is aware of this recommendation.   - carvedilol (COREG) 6.25 MG tablet; Take 1 tablet (6.25 mg total) by mouth 2 (two)  times daily with a meal.  Dispense: 60  tablet; Refill: 3 - spironolactone (ALDACTONE) 25 MG tablet; Take 1 tablet (25 mg total) by mouth daily.  Dispense: 90 tablet; Refill: 3  6. [facility])  Followed by cardiology.   7. Diabetes mellitus  Likely still uncontrolled. Plan to start Jardiance at next visit, insurance permitting.   Return in about 1 month (around 04/18/2018) for DM.  The entirety of the information documented in the History of Present Illness, Review of Systems and Physical Exam were personally obtained by me. Portions of this information were initially documented by Kavin Leech, CMA and reviewed by me for thoroughness and accuracy.

## 2018-03-18 NOTE — Patient Instructions (Signed)
Heart Failure Heart failure is a condition in which the heart has trouble pumping blood because it has become weak or stiff. This means that the heart does not pump blood efficiently for the body to work well. For some people with heart failure, fluid may back up into the lungs and there may be swelling (edema) in the lower legs. Heart failure is usually a long-term (chronic) condition. It is important for you to take good care of yourself and follow the treatment plan from your health care provider. What are the causes? This condition is caused by some health problems, including:  High blood pressure (hypertension). Hypertension causes the heart muscle to work harder than normal. High blood pressure eventually causes the heart to become stiff and weak.  Coronary artery disease (CAD). CAD is the buildup of cholesterol and fat (plaques) in the arteries of the heart.  Heart attack (myocardial infarction). Injured tissue, which is caused by the heart attack, does not contract as well and the heart's ability to pump blood is weakened.  Abnormal heart valves. When the heart valves do not open and close properly, the heart muscle must pump harder to keep the blood flowing.  Heart muscle disease (cardiomyopathy or myocarditis). Heart muscle disease is damage to the heart muscle from a variety of causes, such as drug or alcohol abuse, infections, or unknown causes. These can increase the risk of heart failure.  Lung disease. When the lungs do not work properly, the heart must work harder.  What increases the risk? Risk of heart failure increases as a person ages. This condition is also more likely to develop in people who:  Are overweight.  Are male.  Smoke or chew tobacco.  Abuse alcohol or illegal drugs.  Have taken medicines that can damage the heart, such as chemotherapy drugs.  Have diabetes. ? High blood sugar (glucose) is associated with high fat (lipid) levels in the blood. ? Diabetes  can also damage tiny blood vessels that carry nutrients to the heart muscle.  Have abnormal heart rhythms.  Have thyroid problems.  Have low blood counts (anemia).  What are the signs or symptoms? Symptoms of this condition include:  Shortness of breath with activity, such as when climbing stairs.  Persistent cough.  Swelling of the feet, ankles, legs, or abdomen.  Unexplained weight gain.  Difficulty breathing when lying flat (orthopnea).  Waking from sleep because of the need to sit up and get more air.  Rapid heartbeat.  Fatigue and loss of energy.  Feeling light-headed, dizzy, or close to fainting.  Loss of appetite.  Nausea.  Increased urination during the night (nocturia).  Confusion.  How is this diagnosed? This condition is diagnosed based on:  Medical history, symptoms, and a physical exam.  Diagnostic tests, which may include: ? Echocardiogram. ? Electrocardiogram (ECG). ? Chest X-ray. ? Blood tests. ? Exercise stress test. ? Radionuclide scans. ? Cardiac catheterization and angiogram.  How is this treated? Treatment for this condition is aimed at managing the symptoms of heart failure. Medicines, behavioral changes, or other treatments may be necessary to treat heart failure. Medicines These may include:  Angiotensin-converting enzyme (ACE) inhibitors. This type of medicine blocks the effects of a blood protein called angiotensin-converting enzyme. ACE inhibitors relax (dilate) the blood vessels and help to lower blood pressure.  Angiotensin receptor blockers (ARBs). This type of medicine blocks the actions of a blood protein called angiotensin. ARBs dilate the blood vessels and help to lower blood pressure.  Water   pills (diuretics). Diuretics cause the kidneys to remove salt and water from the blood. The extra fluid is removed through urination, leaving a lower volume of blood that the heart has to pump.  Beta blockers. These improve heart  muscle strength and they prevent the heart from beating too quickly.  Digoxin. This increases the force of the heartbeat.  Healthy behavior changes These may include:  Reaching and maintaining a healthy weight.  Stopping smoking or chewing tobacco.  Eating heart-healthy foods.  Limiting or avoiding alcohol.  Stopping use of street drugs (illegal drugs).  Physical activity.  Other treatments These may include:  Surgery to open blocked coronary arteries or repair damaged heart valves.  Placement of a biventricular pacemaker to improve heart muscle function (cardiac resynchronization therapy). This device paces both the right ventricle and left ventricle.  Placement of a device to treat serious abnormal heart rhythms (implantable cardioverter defibrillator, or ICD).  Placement of a device to improve the pumping ability of the heart (left ventricular assist device, or LVAD).  Heart transplant. This can cure heart failure, and it is considered for certain patients who do not improve with other therapies.  Follow these instructions at home: Medicines  Take over-the-counter and prescription medicines only as told by your health care provider. Medicines are important in reducing the workload of your heart, slowing the progression of heart failure, and improving your symptoms. ? Do not stop taking your medicine unless your health care provider told you to do that. ? Do not skip any dose of medicine. ? Refill your prescriptions before you run out of medicine. You need your medicines every day. Eating and drinking   Eat heart-healthy foods. Talk with a dietitian to make an eating plan that is right for you. ? Choose foods that contain no trans fat and are low in saturated fat and cholesterol. Healthy choices include fresh or frozen fruits and vegetables, fish, lean meats, legumes, fat-free or low-fat dairy products, and whole-grain or high-fiber foods. ? Limit salt (sodium) if  directed by your health care provider. Sodium restriction may reduce symptoms of heart failure. Ask a dietitian to recommend heart-healthy seasonings. ? Use healthy cooking methods instead of frying. Healthy methods include roasting, grilling, broiling, baking, poaching, steaming, and stir-frying.  Limit your fluid intake if directed by your health care provider. Fluid restriction may reduce symptoms of heart failure. Lifestyle  Stop smoking or using chewing tobacco. Nicotine and tobacco can damage your heart and your blood vessels. Do not use nicotine gum or patches before talking to your health care provider.  Limit alcohol intake to no more than 1 drink per day for non-pregnant women and 2 drinks per day for men. One drink equals 12 oz of beer, 5 oz of wine, or 1 oz of hard liquor. ? Drinking more than that is harmful to your heart. Tell your health care provider if you drink alcohol several times a week. ? Talk with your health care provider about whether any level of alcohol use is safe for you. ? If your heart has already been damaged by alcohol or you have severe heart failure, drinking alcohol should be stopped completely.  Stop use of illegal drugs.  Lose weight if directed by your health care provider. Weight loss may reduce symptoms of heart failure.  Do moderate physical activity if directed by your health care provider. People who are elderly and people with severe heart failure should consult with a health care provider for physical activity recommendations.   Monitor important information  Weigh yourself every day. Keeping track of your weight daily helps you to notice excess fluid sooner. ? Weigh yourself every morning after you urinate and before you eat breakfast. ? Wear the same amount of clothing each time you weigh yourself. ? Record your daily weight. Provide your health care provider with your weight record.  Monitor and record your blood pressure as told by your health  care provider.  Check your pulse as told by your health care provider. Dealing with extreme temperatures  If the weather is extremely hot: ? Avoid vigorous physical activity. ? Use air conditioning or fans or seek a cooler location. ? Avoid caffeine and alcohol. ? Wear loose-fitting, lightweight, and light-colored clothing.  If the weather is extremely cold: ? Avoid vigorous physical activity. ? Layer your clothes. ? Wear mittens or gloves, a hat, and a scarf when you go outside. ? Avoid alcohol. General instructions  Manage other health conditions such as hypertension, diabetes, thyroid disease, or abnormal heart rhythms as told by your health care provider.  Learn to manage stress. If you need help to do this, ask your health care provider.  Plan rest periods when fatigued.  Get ongoing education and support as needed.  Participate in or seek rehabilitation as needed to maintain or improve independence and quality of life.  Stay up to date with immunizations. Keeping current on pneumococcal and influenza immunizations is especially important to prevent respiratory infections.  Keep all follow-up visits as told by your health care provider. This is important. Contact a health care provider if:  You have a rapid weight gain.  You have increasing shortness of breath that is unusual for you.  You are unable to participate in your usual physical activities.  You tire easily.  You cough more than normal, especially with physical activity.  You have any swelling or more swelling in areas such as your hands, feet, ankles, or abdomen.  You are unable to sleep because it is hard to breathe.  You feel like your heart is beating quickly (palpitations).  You become dizzy or light-headed when you stand up. Get help right away if:  You have difficulty breathing.  You notice or your family notices a change in your awareness, such as having trouble staying awake or having  difficulty with concentration.  You have pain or discomfort in your chest.  You have an episode of fainting (syncope). This information is not intended to replace advice given to you by your health care provider. Make sure you discuss any questions you have with your health care provider. Document Released: 11/02/2005 Document Revised: 07/07/2016 Document Reviewed: 05/27/2016 Elsevier Interactive Patient Education  2018 [employer]  

## 2018-03-22 NOTE — Progress Notes (Signed)
Received hospital follow-up note from patient's PCP. It appears there is a significant disconnect between provider and the patient's understanding of his comorbid conditions. He is scheduled for follow-up with our office on 5/31. I have asked our staff to ensure that the patient comes to this appointment. We have attempted to contact the patient numerous times for a repeat limited echo to evaluate for potential apical thrombus, without success. Our staff will again attempt to contact the patient to advise him we should have this completed prior to his office visit on 5/31, even if he does not have it completed by that time, we still need to see him. I understand he recently had an echocardiogram however we were unable to rule out a potentially life altering/threatening sequela from his cardiomyopathy, which would need further medical management. I agree with the patient's PCP with restarting evidence-based heart failure therapies including Coreg, losartan, and spironolactone. We could discuss transition to [facility] at his follow up given his cardiomyopathy and HTN (previously unable to afford). I also agree with resumption of Lipitor. There is no documentation of anyone discontinuing his evidence-based heart failure medications. He should have follow up labs at his office visit with [place] later this month. The patient's A1c was poorly controlled as of 03/03/2018, I will defer management of this to his PCP as they are planning on potentially starting Jardiance. He remains on metformin.

## 2018-03-23 ENCOUNTER — Telehealth: Payer: Self-pay | Admitting: *Deleted

## 2018-03-23 NOTE — Telephone Encounter (Signed)
-----   Message from Sondra Barges, PA-C sent at 03/22/2018  4:37 PM EDT -----   ----- Message ----- From: [name], PA-C Sent: 03/22/2018   3:43 PM To: Sondra Barges, PA-C

## 2018-03-23 NOTE — Telephone Encounter (Signed)
Patient has echo on 04/01/18 and appointment with Eula Listen, PA on 04/15/18. Ryan requested we contact patient to stress the importance of echo and making the scheduled importance as this is imperative to his health. No answer. Left message to call back.

## 2018-03-27 ENCOUNTER — Encounter: Payer: Self-pay | Admitting: Cardiovascular Disease

## 2018-03-28 ENCOUNTER — Other Ambulatory Visit: Payer: Self-pay | Admitting: Physician Assistant

## 2018-03-28 DIAGNOSIS — I513 Intracardiac thrombosis, not elsewhere classified: Secondary | ICD-10-CM

## 2018-03-29 ENCOUNTER — Encounter: Payer: Self-pay | Admitting: Physician Assistant

## 2018-04-01 ENCOUNTER — Ambulatory Visit (INDEPENDENT_AMBULATORY_CARE_PROVIDER_SITE_OTHER): Payer: Managed Care, Other (non HMO)

## 2018-04-01 ENCOUNTER — Other Ambulatory Visit: Payer: Self-pay

## 2018-04-01 DIAGNOSIS — I513 Intracardiac thrombosis, not elsewhere classified: Secondary | ICD-10-CM

## 2018-04-01 LAB — HM DIABETES EYE EXAM

## 2018-04-01 NOTE — Telephone Encounter (Signed)
Patient here for echo appointment today. Discussed with patient that is is very important to keep his f/u appointment as well to discuss results and plan of care. Patient reluctant but verbalized understanding.

## 2018-04-14 NOTE — Progress Notes (Deleted)
   Cardiology Office Note Date:  04/14/2018  Patient ID:  Keith [name], Keith [name] [birth year], MRN [id number] PCP:  [name], PA-C  Cardiologist:  Dr. Mariah Milling, MD  ***refresh   Chief Complaint: ***  History of Present Illness: Keith [name] is a 47 y.o. male with history of ***   Past Medical History:  Diagnosis Date  . CHF (congestive heart failure) (HCC)   . Coronary artery disease   . Diabetes mellitus   . Frequent headaches   . Hypertension     Past Surgical History:  Procedure Laterality Date  . COLONOSCOPY WITH PROPOFOL N/A 02/19/2017   Procedure: COLONOSCOPY WITH PROPOFOL;  Surgeon: [name], MD;  Location: [facility];  Service: Endoscopy;  Laterality: N/A;  . ESOPHAGOGASTRODUODENOSCOPY (EGD) WITH PROPOFOL N/A 02/19/2017   Procedure: ESOPHAGOGASTRODUODENOSCOPY (EGD) WITH PROPOFOL;  Surgeon: [name], MD;  Location: [facility]OS[facility];  Service: Endoscopy;  Laterality: N/A;  . LEFT HEART CATH AND CORONARY ANGIOGRAPHY N/A 07/11/2017   Procedure: LEFT HEART CATH AND CORONARY ANGIOGRAPHY;  Surgeon: [name], MD;  Location: [facility] INVASIVE CV LAB;  Service: Cardiovascular;  Laterality: N/A;    No outpatient medications have been marked as taking for the 04/15/18 encounter (Appointment) with Sondra Barges, PA-C.    Allergies:   Other   Social History:  The patient  reports that he has been smoking cigarettes.  He has been smoking about 1.00 pack per day. He has never used smokeless tobacco. He reports that he drinks alcohol. He reports that he does not use drugs.   Family History:  The patient's family history includes Diabetes in his father and mother; Hypertension in his father and mother.  ROS:   ROS   PHYSICAL EXAM: *** VS:  There were no vitals taken for this visit. BMI: There is no height or weight on file to calculate BMI.  Physical Exam  Well nourished, well developed, in no acute distress HEENT: normocephalic, atraumatic Neck: no JVD,  carotid bruits or masses Cardiac: normal S1, S2; RRR; no murmurs, rubs, or gallops Lungs: clear to auscultation bilaterally, no wheezing, rhonchi or rales Abd: soft, nontender, no hepatomegaly, + BS MS: no deformity or atrophy Ext: no edema Skin: warm and dry, no rash Neuro:  moves all extremities spontaneously, no focal abnormalities noted, follows commands Psych: euthymic mood, full affect   EKG:  Was ordered and interpreted by me today. Shows ***  Recent Labs: 09/24/2017: B Natriuretic Peptide 154.8 03/02/2018: ALT 18; BUN 15; Creatinine, Ser 0.89; Hemoglobin 15.2; Platelets 166; Potassium 3.4; Sodium 136 03/03/2018: TSH 3.644  03/03/2018: Cholesterol 134; HDL 31; LDL Cholesterol 70; Total CHOL/HDL Ratio 4.3; Triglycerides 165; VLDL 33   CrCl cannot be calculated (Patient's most recent lab result is older than the maximum 21 days allowed.).   Wt Readings from Last 3 Encounters:  03/18/18 223 lb (101.2 kg)  03/02/18 225 lb (102.1 kg)  02/11/18 225 lb (102.1 kg)     Other studies reviewed: Additional studies/records reviewed today include: summarized above  ASSESSMENT AND PLAN:  1. ***  Disposition: F/u with *** in   Current medicines are reviewed at length with the patient today.  The patient did not have any concerns regarding medicines.  Elinor Dodge PA-C 04/14/2018 7:38 AM     [facility] [address] [place], [place] [address] [phone]

## 2018-04-15 ENCOUNTER — Ambulatory Visit: Payer: Managed Care, Other (non HMO) | Admitting: Physician Assistant

## 2018-04-22 ENCOUNTER — Ambulatory Visit: Payer: Managed Care, Other (non HMO) | Admitting: Physician Assistant

## 2018-05-20 ENCOUNTER — Ambulatory Visit: Payer: Managed Care, Other (non HMO) | Admitting: Nurse Practitioner

## 2018-05-20 ENCOUNTER — Encounter: Payer: Self-pay | Admitting: Nurse Practitioner

## 2018-05-20 VITALS — BP 158/100 | HR 78 | Ht 68.0 in | Wt 225.5 lb

## 2018-05-20 DIAGNOSIS — I10 Essential (primary) hypertension: Secondary | ICD-10-CM | POA: Diagnosis not present

## 2018-05-20 DIAGNOSIS — I5042 Chronic combined systolic (congestive) and diastolic (congestive) heart failure: Secondary | ICD-10-CM

## 2018-05-20 DIAGNOSIS — I428 Other cardiomyopathies: Secondary | ICD-10-CM | POA: Diagnosis not present

## 2018-05-20 DIAGNOSIS — E782 Mixed hyperlipidemia: Secondary | ICD-10-CM

## 2018-05-20 MED ORDER — SILDENAFIL CITRATE 50 MG PO TABS: 50.0000 mg | ORAL_TABLET | Freq: Every day | ORAL | 3 refills | Status: DC | PRN

## 2018-05-20 MED ORDER — CARVEDILOL 12.5 MG PO TABS: 12.5000 mg | ORAL_TABLET | Freq: Two times a day (BID) | ORAL | 3 refills | Status: DC

## 2018-05-20 NOTE — Patient Instructions (Addendum)
Medication Instructions: - Your physician has recommended you make the following change in your medication:   1) Increase coreg (carvedilol) to 12.5 mg- take 1 tablet by mouth twice daily  2) Start viagra 50 mg- take 1 tablet by mouth daily as needed  Labwork: - Your physician recommends that you have lab work today: [employer]  Procedures/Testing: - none ordered  Follow-Up: - Your physician recommends that you schedule a follow-up appointment in: 3 months with Dr. Mariah Milling.   Any Additional Special Instructions Will Be Listed Below (If Applicable).     If you need a refill on your cardiac medications before your next appointment, please call your pharmacy.

## 2018-05-20 NOTE — Progress Notes (Signed)
Office Visit    Patient Name: Keith [name] Date of Encounter: 05/20/2018  Primary Care Provider:  [name], PA-C Primary Cardiologist:  Julien Nordmann, MD  Chief Complaint    34 male with a history of nonischemic cardiomyopathy, chronic combined systolic and diastolic congestive heart failure, hypertension, hyperlipidemia, tobacco abuse, erectile dysfunction, and noncompliance, who presents for follow-up.  Past Medical History    Past Medical History:  Diagnosis Date  . Chronic combined systolic (congestive) and diastolic (congestive) heart failure (HCC)    a. 06/2017 Echo: EF 35-40%, Gr1 DD; b. 02/2018 Echo: Ef 30-35%; c. 03/2018 Echo: EF 35-40%, mid-apicalanteroseptal, ant, and apical HK. Mildly dil LA. No LV thrombus.  . Diabetes mellitus   . Erectile dysfunction   . Essential hypertension   . Frequent headaches   . NICM (nonischemic cardiomyopathy) (HCC)    a. 06/2017 Echo: EF 35-40%, Gr1 DD; b. 06/2017 Cath: nonobs dzs; c. 02/2018 Echo: Ef 30-35%; d. 03/2018 Echo: EF 35-40%, mid-apicalanteroseptal, ant, and apical HK. Mildly dil LA. No LV thrombus.  . Non-obstructive Coronary Artery Disease    a. 06/2017 Cath: LM nl, LAD nl, D1/2/3 nl, LCX 30p, OM1 nl, OM2 min irregs, OM3/4 nl, RCA 30p/m, RPL2/3 nl.  . TIA (transient ischemic attack)    a. 02/2018 MRA w/o acute findings; b. 02/2018 Carotis U/S: no hemodynamically significant stenoses.  . Tobacco abuse    Past Surgical History:  Procedure Laterality Date  . COLONOSCOPY WITH PROPOFOL N/A 02/19/2017   Procedure: COLONOSCOPY WITH PROPOFOL;  Surgeon: [name], MD;  Location: [facility];  Service: Endoscopy;  Laterality: N/A;  . ESOPHAGOGASTRODUODENOSCOPY (EGD) WITH PROPOFOL N/A 02/19/2017   Procedure: ESOPHAGOGASTRODUODENOSCOPY (EGD) WITH PROPOFOL;  Surgeon: [name], MD;  Location: [facility];  Service: Endoscopy;  Laterality: N/A;  . LEFT HEART CATH AND CORONARY ANGIOGRAPHY N/A 07/11/2017   Procedure: LEFT  HEART CATH AND CORONARY ANGIOGRAPHY;  Surgeon: [name], MD;  Location: [facility] CV [facility];  Service: Cardiovascular;  Laterality: N/A;    Allergies  Allergies  Allergen Reactions  . Other Other (See Comments)    Muscle Relaxers (caused psychotic break)    History of Present Illness    47 year old male with the above complex past medical history including hypertension, hyperlipidemia, tobacco abuse, nonischemic cardiopathy, combined heart failure, erectile dysfunction, and noncompliance.  In August 2018 he was admitted to [facility] with dyspnea and hypertensive urgency.  EF was 35 to 40%.  Catheterization revealed nonobstructive disease.  He was placed on medical therapy.  He was not able to afford Entresto and he was maintained on beta-blocker, ARB, Lasix, and Spironolactone.  He was lost to cardiology follow-up but was admitted to [facility] in April with slurred speech and TIA.  Echo during that hospital stay showed an EF of 30 to 35%.  LV thrombus could not be ruled out.  Recommendation was made for echo with Definity to rule out LV thrombus.  Patient was subsequently discharged.  He had come off of all his cardiac medications and when he was seen by primary care, he was referred back to cardiology.  Echo was performed and showed an EF of 35 to 40% without evidence of LV thrombus.  He says that since coming out of the hospital and being placed back on cardiac meds by primary care, he has been more compliant.  He sometimes misses a dose of Lasix but says overall he has been doing well with his medicines and tolerating  them well.  He denies chest pain, dyspnea, palpitations, PND, orthopnea, dizziness, syncope, edema, or early satiety.  He works full-time without limitations.  He does not weigh himself daily.  He says that over the past year or more, he has noted erectile dysfunction.  He is interested in Viagra.  Home Medications    Prior to Admission medications     Medication Sig Start Date End Date Taking? Authorizing Provider  aspirin 81 MG chewable tablet Chew 1 tablet (81 mg total) by mouth daily. 07/13/17  Yes [name], MD  atorvastatin (LIPITOR) 40 MG tablet Take 1 tablet (40 mg total) by mouth daily at 6 PM. 03/18/18  Yes Osvaldo Angst M, PA-C  furosemide (LASIX) 40 MG tablet Take 1 tablet (40 mg total) by mouth daily. 07/22/17  Yes Antonieta Iba, MD  losartan (COZAAR) 100 MG tablet Take 1 tablet (100 mg total) by mouth daily. 03/18/18  Yes Osvaldo Angst M, PA-C  metFORMIN (GLUCOPHAGE) 500 MG tablet Take 500mg  daily x 1 wk. Then take 500 mg twice daily x 1 wk. Then take 1000 mg in the morning and 500 mg at night x 1 wk. Then take 1000 mg twice daily x 1 wk. 02/15/18  Yes [name], PA-C  Omeprazole 20 MG TBEC Take 2 tablets (40 mg total) by mouth daily. 07/22/17  Yes Gollan, Tollie Pizza, MD  potassium chloride SA (K-DUR,KLOR-CON) 20 MEQ tablet Take 1 tablet (20 mEq total) by mouth daily. 07/22/17  Yes Antonieta Iba, MD  spironolactone (ALDACTONE) 25 MG tablet Take 1 tablet (25 mg total) by mouth daily. 03/18/18  Yes Osvaldo Angst M, PA-C  carvedilol (COREG) 6.25 MG tablet Take 1 tablet (6.25 mg total) by mouth 2 (two) times daily with a meal. Patient not taking: Reported on 05/20/2018 03/18/18 05/20/18  [name], PA-C    Review of Systems    Having issues with erectile dysfunction.  He denies chest pain, palpitations, dyspnea, pnd, orthopnea, n, v, dizziness, syncope, edema, weight gain, or early satiety.  All other systems reviewed and are otherwise negative except as noted above.  Physical Exam    VS:  BP (!) 158/100 (BP Location: Left Arm, Patient Position: Sitting, Cuff Size: Normal)   Pulse 78   Ht 5\' 8"  (1.727 m)   Wt 225 lb 8 oz (102.3 kg)   BMI 34.29 kg/m  , BMI Body mass index is 34.29 kg/m. GEN: Well nourished, well developed, in no acute distress.  HEENT: normal.  Neck: Supple, no JVD, carotid bruits, or  masses. Cardiac: RRR, 2/6 systolic ejection murmur at the right upper sternal border. +S4. No clubbing, cyanosis, edema.  Radials/DP/PT 2+ and equal bilaterally.  Respiratory:  Respirations regular and unlabored, clear to auscultation bilaterally. GI: Soft, nontender, nondistended, BS + x 4. MS: no deformity or atrophy. Skin: warm and dry, no rash. Neuro:  Strength and sensation are intact. Psych: Normal affect.  Accessory Clinical Findings    ECG - regular sinus rhythm, 78, left axis deviation, left bundle branch block.  No acute changes.  Assessment & Plan    1.  Nonischemic cardia myopathy/chronic combined systolic and diastolic congestive heart failure: EF 35 to 40% by recent echo.  Patient says he has been back on his cardiac medications for about a month and a half now.  He says he has been tolerating these well.  He denies dyspnea or edema.  We discussed the importance of daily weights, sodium restriction, medication compliance, and  symptom reporting and he verbalizes understanding.  I will follow-up a basic metabolic panel today as he has not had one since resuming heart failure medications.  I am also going to titrate his carvedilol to 12.5 mg twice daily in the setting of ongoing hypertension.  He will otherwise continue Lasix, losartan, and spironolactone.  We did have a discussion about potentially switching him from losartan to [facility] however he wishes to defer this as he does not think he will be able to afford it.  2.  Essential hypertension: Blood pressure elevated at 158/100.  He says this is actually good for him.  I am going to increase his carvedilol to 12.5 mg twice daily.  Continue losartan, Lasix, and spironolactone.  He has not been taking Lasix as prescribed.  3.  Hyperlipidemia: LDL 70 in April.  Continue statin therapy.  4.  Erectile dysfunction: Patient is interested in Viagra therapy.  He has nonobstructive CAD and does not experience chest pain.  He does not use  nitrates.  I will prescribe Viagra 50 mg daily as needed, #15 with 3 refills.  He is aware that he may not use nitrates if he develops chest pain after using Viagra.  5.  Tobacco abuse: Cessation advised.  6.  TIA: Slurred speech in April with spontaneous resolution.  Imaging was negative for stroke.  No recurrence.  He remains on aspirin and statin.    7.  Disposition: Follow-up basic metabolic panel today.  Follow-up in clinic in 2 to 3 months or sooner if necessary.   Nicolasa Ducking, NP 05/20/2018, 11:38 AM

## 2018-05-21 LAB — BASIC METABOLIC PANEL
BUN / CREAT RATIO: 11 (ref 9–20)
BUN: 9 mg/dL (ref 6–24)
CHLORIDE: 98 mmol/L (ref 96–106)
CO2: 26 mmol/L (ref 20–29)
Calcium: 9.3 mg/dL (ref 8.7–10.2)
Creatinine, Ser: 0.79 mg/dL (ref 0.76–1.27)
GFR calc Af Amer: 124 mL/min/{1.73_m2} (ref >59)
GFR calc non Af Amer: 107 mL/min/{1.73_m2} (ref >59)
Glucose: 301 mg/dL — ABNORMAL HIGH (ref 65–99)
Potassium: 4.5 mmol/L (ref 3.5–5.2)
SODIUM: 138 mmol/L (ref 134–144)

## 2018-06-15 ENCOUNTER — Encounter: Payer: Self-pay | Admitting: Cardiovascular Disease

## 2018-06-15 NOTE — Telephone Encounter (Signed)
I recommend he follow up with his PCP.

## 2018-06-17 ENCOUNTER — Ambulatory Visit: Payer: Managed Care, Other (non HMO) | Admitting: Physician Assistant

## 2018-06-17 ENCOUNTER — Encounter: Payer: Self-pay | Admitting: Physician Assistant

## 2018-06-17 VITALS — BP 174/116 | HR 86 | Temp 98.4°F | Wt 224.0 lb

## 2018-06-17 DIAGNOSIS — E1159 Type 2 diabetes mellitus with other circulatory complications: Secondary | ICD-10-CM

## 2018-06-17 DIAGNOSIS — I5021 Acute systolic (congestive) heart failure: Secondary | ICD-10-CM | POA: Diagnosis not present

## 2018-06-17 DIAGNOSIS — N529 Male erectile dysfunction, unspecified: Secondary | ICD-10-CM

## 2018-06-17 DIAGNOSIS — F32A Depression, unspecified: Secondary | ICD-10-CM

## 2018-06-17 DIAGNOSIS — E118 Type 2 diabetes mellitus with unspecified complications: Secondary | ICD-10-CM

## 2018-06-17 DIAGNOSIS — R12 Heartburn: Secondary | ICD-10-CM | POA: Diagnosis not present

## 2018-06-17 DIAGNOSIS — I10 Essential (primary) hypertension: Secondary | ICD-10-CM

## 2018-06-17 DIAGNOSIS — F329 Major depressive disorder, single episode, unspecified: Secondary | ICD-10-CM

## 2018-06-17 DIAGNOSIS — I152 Hypertension secondary to endocrine disorders: Secondary | ICD-10-CM

## 2018-06-17 LAB — POCT GLYCOSYLATED HEMOGLOBIN (HGB A1C): Hemoglobin A1C: 11 % — AB (ref 4.0–5.6)

## 2018-06-17 MED ORDER — FUROSEMIDE 40 MG PO TABS: 40.0000 mg | ORAL_TABLET | Freq: Every day | ORAL | 3 refills | Status: DC

## 2018-06-17 MED ORDER — METFORMIN HCL 500 MG PO TABS: ORAL_TABLET | ORAL | 3 refills | Status: DC

## 2018-06-17 MED ORDER — LOSARTAN POTASSIUM 100 MG PO TABS: 100.0000 mg | ORAL_TABLET | Freq: Every day | ORAL | 0 refills | Status: DC

## 2018-06-17 MED ORDER — SPIRONOLACTONE 25 MG PO TABS: 25.0000 mg | ORAL_TABLET | Freq: Every day | ORAL | 3 refills | Status: DC

## 2018-06-17 MED ORDER — CARVEDILOL 12.5 MG PO TABS: 12.5000 mg | ORAL_TABLET | Freq: Two times a day (BID) | ORAL | 3 refills | Status: DC

## 2018-06-17 MED ORDER — SERTRALINE HCL 50 MG PO TABS: 50.0000 mg | ORAL_TABLET | Freq: Every day | ORAL | 0 refills | Status: DC

## 2018-06-17 MED ORDER — OMEPRAZOLE 20 MG PO TBEC: 2.0000 | DELAYED_RELEASE_TABLET | Freq: Every day | ORAL | Status: DC

## 2018-06-17 MED ORDER — ATORVASTATIN CALCIUM 40 MG PO TABS: 40.0000 mg | ORAL_TABLET | Freq: Every day | ORAL | 0 refills | Status: DC

## 2018-06-17 MED ORDER — SPIRONOLACTONE 25 MG PO TABS: 25.0000 mg | ORAL_TABLET | Freq: Every day | ORAL | 0 refills | Status: DC

## 2018-06-17 MED ORDER — POTASSIUM CHLORIDE CRYS ER 20 MEQ PO TBCR: 20.0000 meq | EXTENDED_RELEASE_TABLET | Freq: Every day | ORAL | 4 refills | Status: DC

## 2018-06-17 NOTE — Progress Notes (Signed)
Patient: Keith [name] Male    DOB: 1970-12-17   47 y.o.   MRN: [id number] Visit Date: 07/21/2018  Today's Provider: [name], PA-C   Chief Complaint  Patient presents with  . Diabetes  . Hypertension   Subjective:      CHF: Last echo 03/2018 showed EF 35-40%, BP remains uncontrolled, patient is extremely non-compliant with all medications.    Diabetes  He presents for his follow-up diabetic visit. He has type 2 diabetes mellitus. Pertinent negatives for hypoglycemia include no dizziness or headaches. Associated symptoms include polydipsia and polyuria. Pertinent negatives for diabetes include no blurred vision, no chest pain, no fatigue, no foot paresthesias, no foot ulcerations, no polyphagia, no visual change, no weakness and no weight loss.   BP Readings from Last 3 Encounters:  06/17/18 (!) 174/116  05/20/18 (!) 158/100  03/18/18 (!) 150/92   Wt Readings from Last 3 Encounters:  06/17/18 224 lb (101.6 kg)  05/20/18 225 lb 8 oz (102.3 kg)  03/18/18 223 lb (101.2 kg)   Noncompliant with diabetes medications. A1c very high. Has stpoped taking all BP medications as well.   Also expresses mood dysregulation, passive SI.      Allergies  Allergen Reactions  . Other Other (See Comments)    Muscle Relaxers (caused psychotic break)     Current Outpatient Medications:  .  aspirin 81 MG chewable tablet, Chew 1 tablet (81 mg total) by mouth daily., Disp: 30 tablet, Rfl: 2 .  carvedilol (COREG) 12.5 MG tablet, Take 1 tablet (12.5 mg total) by mouth 2 (two) times daily., Disp: 180 tablet, Rfl: 3 .  metFORMIN (GLUCOPHAGE) 500 MG tablet, 500mg  daily x 1 wk. 500 mg twice daily x 1 wk. 1000 mg in the morning and 500 mg at night x 1 wk. 1000 mg twice daily x 1 wk., Disp: 180 tablet, Rfl: 3 .  atorvastatin (LIPITOR) 40 MG tablet, Take 1 tablet (40 mg total) by mouth daily at 6 PM., Disp: 90 tablet, Rfl: 0 .  furosemide (LASIX) 40 MG tablet, Take 1 tablet (40 mg  total) by mouth daily., Disp: 90 tablet, Rfl: 3 .  losartan (COZAAR) 100 MG tablet, Take 1 tablet (100 mg total) by mouth daily., Disp: 90 tablet, Rfl: 0 .  Omeprazole 20 MG TBEC, Take 2 tablets (40 mg total) by mouth daily., Disp: 90 each, Rfl:  .  potassium chloride SA (K-DUR,KLOR-CON) 20 MEQ tablet, Take 1 tablet (20 mEq total) by mouth daily., Disp: 90 tablet, Rfl: 4 .  sertraline (ZOLOFT) 50 MG tablet, Take 1 tablet (50 mg total) by mouth daily., Disp: 90 tablet, Rfl: 0 .  sildenafil (VIAGRA) 50 MG tablet, Take 1 tablet (50 mg total) by mouth daily as needed for erectile dysfunction. (Patient not taking: Reported on 06/17/2018), Disp: 15 tablet, Rfl: 3 .  spironolactone (ALDACTONE) 25 MG tablet, Take 1 tablet (25 mg total) by mouth daily., Disp: 90 tablet, Rfl: 0  Review of Systems  Constitutional: Negative.  Negative for fatigue and weight loss.  Eyes: Negative for blurred vision.  Cardiovascular: Negative for chest pain.  Gastrointestinal: Negative.   Endocrine: Positive for polydipsia and polyuria. Negative for cold intolerance, heat intolerance and polyphagia.  Genitourinary: Negative for hematuria.  Neurological: Negative for dizziness, weakness and headaches.    Social History   Tobacco Use  . Smoking status: Current Every Day Smoker    Packs/day: 1.00    Types: Cigarettes  . Smokeless  tobacco: Never Used  Substance Use Topics  . Alcohol use: Yes    Comment: Occasionally    Objective:   BP (!) 174/116 (BP Location: Right Arm, Patient Position: Sitting, Cuff Size: Normal)   Pulse 86   Temp 98.4 F (36.9 C) (Oral)   Wt 224 lb (101.6 kg)   SpO2 96%   BMI 34.06 kg/m  Vitals:   06/17/18 0934  BP: (!) 174/116  Pulse: 86  Temp: 98.4 F (36.9 C)  TempSrc: Oral  SpO2: 96%  Weight: 224 lb (101.6 kg)     Physical Exam  Constitutional: He is oriented to person, place, and time. He appears well-developed and well-nourished.  Cardiovascular: Normal rate and regular  rhythm.  Pulmonary/Chest: Effort normal and breath sounds normal.  Neurological: He is alert and oriented to person, place, and time.  Skin: Skin is warm and dry.  Psychiatric: He has a normal mood and affect. His behavior is normal.        Assessment & Plan:     1. Hypertension associated with diabetes (HCC)  - potassium chloride SA (K-DUR,KLOR-CON) 20 MEQ tablet; Take 1 tablet (20 mEq total) by mouth daily.  Dispense: 90 tablet; Refill: 4 - atorvastatin (LIPITOR) 40 MG tablet; Take 1 tablet (40 mg total) by mouth daily at 6 PM.  Dispense: 90 tablet; Refill: 0 - spironolactone (ALDACTONE) 25 MG tablet; Take 1 tablet (25 mg total) by mouth daily.  Dispense: 90 tablet; Refill: 0 - losartan (COZAAR) 100 MG tablet; Take 1 tablet (100 mg total) by mouth daily.  Dispense: 90 tablet; Refill: 0  2. Acute systolic heart failure (HCC)  - furosemide (LASIX) 40 MG tablet; Take 1 tablet (40 mg total) by mouth daily.  Dispense: 90 tablet; Refill: 3 - spironolactone (ALDACTONE) 25 MG tablet; Take 1 tablet (25 mg total) by mouth daily.  Dispense: 90 tablet; Refill: 0  3. DM (diabetes mellitus) with complications (HCC)  - POCT glycosylated hemoglobin (Hb A1C) - metFORMIN (GLUCOPHAGE) 500 MG tablet; 500mg  daily x 1 wk. 500 mg twice daily x 1 wk. 1000 mg in the morning and 500 mg at night x 1 wk. 1000 mg twice daily x 1 wk.  Dispense: 180 tablet; Refill: 3  4. Heartburn  - Omeprazole 20 MG TBEC; Take 2 tablets (40 mg total) by mouth daily.  Dispense: 90 each  5. Depression, unspecified depression type  - sertraline (ZOLOFT) 50 MG tablet; Take 1 tablet (50 mg total) by mouth daily.  Dispense: 90 tablet; Refill: 0  6. Erectile dysfunction, unspecified erectile dysfunction type  - Ambulatory referral to Urology  Return in about 1 month (around 07/18/2018).  The entirety of the information documented in the History of Present Illness, Review of Systems and Physical Exam were personally obtained by  me. Portions of this information were initially documented by Kavin Leech, CMA and reviewed by me for thoroughness and accuracy.        [name], PA-C  [facility]

## 2018-07-22 ENCOUNTER — Ambulatory Visit: Payer: Managed Care, Other (non HMO) | Admitting: Physician Assistant

## 2018-08-19 ENCOUNTER — Ambulatory Visit: Payer: Managed Care, Other (non HMO) | Admitting: Cardiovascular Disease

## 2018-08-19 ENCOUNTER — Ambulatory Visit: Payer: Managed Care, Other (non HMO) | Admitting: Urology

## 2018-08-26 ENCOUNTER — Ambulatory Visit: Payer: Managed Care, Other (non HMO) | Admitting: Physician Assistant

## 2018-08-30 IMAGING — CT CT HEAD W/O CM
5 of 7 series · 17 of 47 positions shown, 18 images · non-contrast
Comparison: 01/23/2015 CT head.

CLINICAL DATA: 45 y/o  M; status post fall with head injury.

EXAM:
CT HEAD WITHOUT CONTRAST
CT CERVICAL SPINE WITHOUT CONTRAST
TECHNIQUE: Multidetector CT imaging of the head and cervical spine was
performed following the standard protocol without intravenous
contrast. Multiplanar CT image reconstructions of the cervical spine
were also generated.

[Series 2: head wo · axial · 0.43mm/px · z∈[-81,-31]mm · 2 of 30 slices shown, 3 images]
[im 10/30  brain]
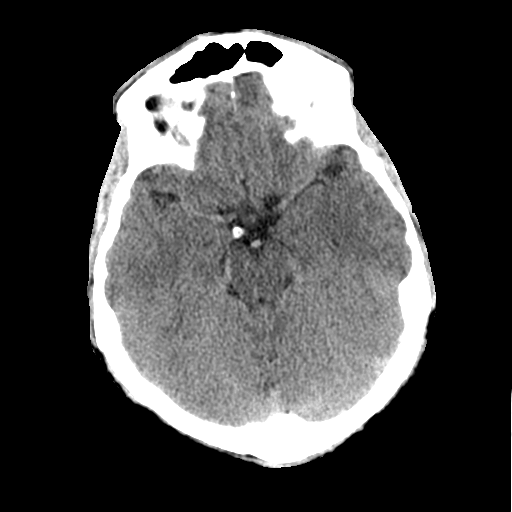
[im 10/30  bone]
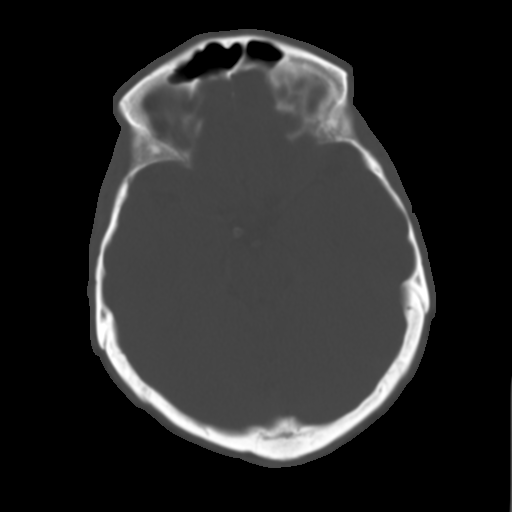
[im 20/30  brain]
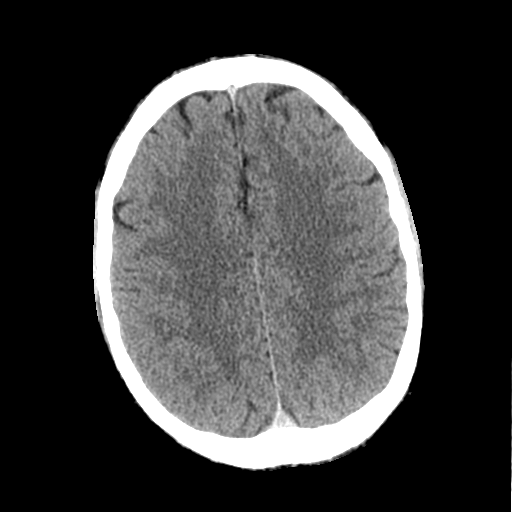

[Series 4: coronal soft tissue · coronal · 0.35mm/px · 3 of 68 slices shown]
[im 26/68  brain]
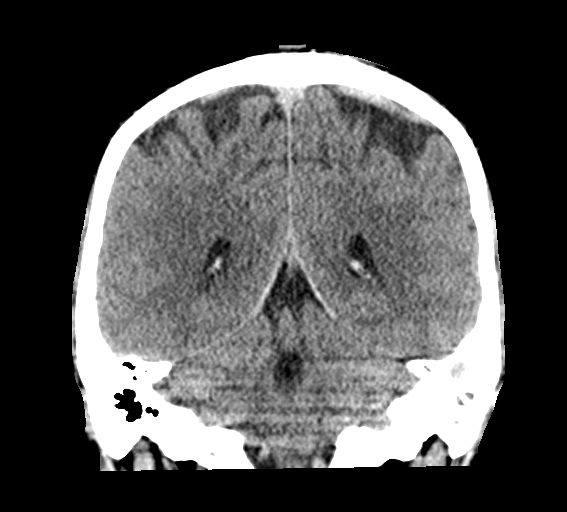
[im 34/68  brain]
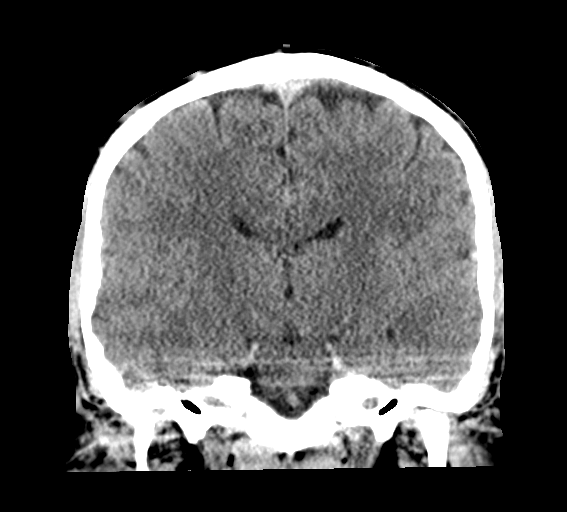
[im 42/68  brain]
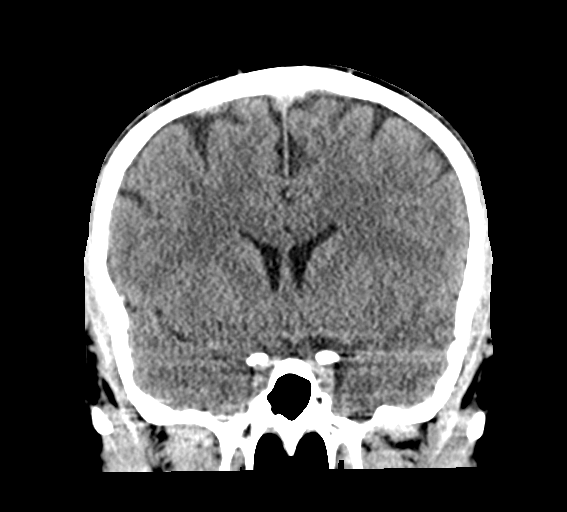

[Series 5: sagittal soft tissue · sagittal · 0.34mm/px · 2 of 58 slices shown]
[im 20/58  brain]
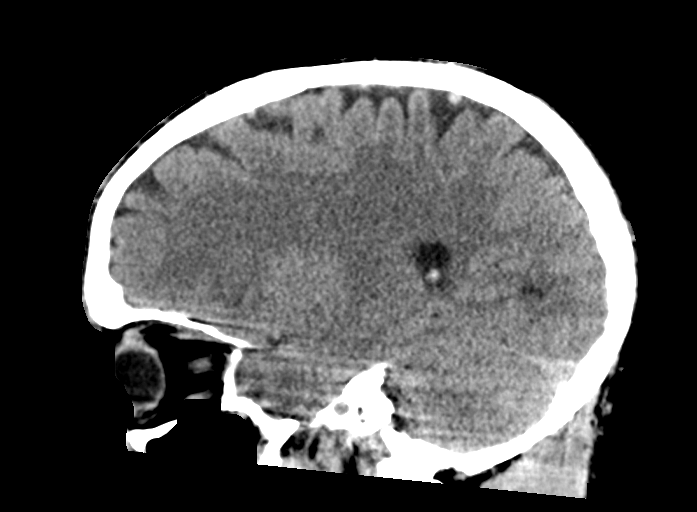
[im 39/58  brain]
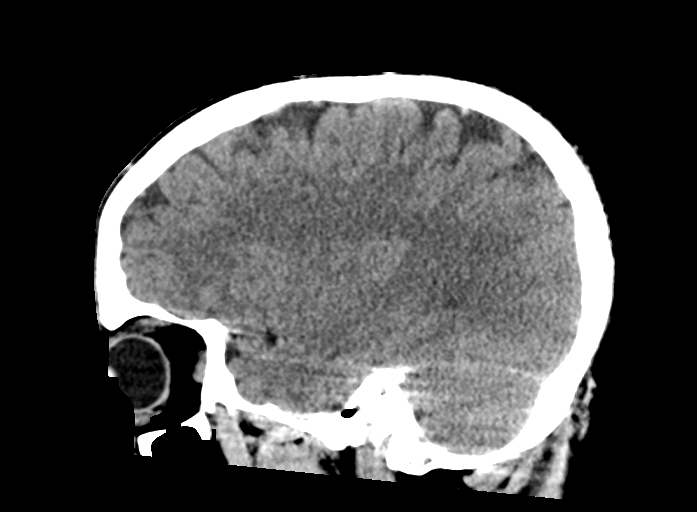

[Series 7: c spine soft · axial · 0.36mm/px · z∈[-289,-271]mm · 2 of 100 slices shown]
[im 10/100  brain]
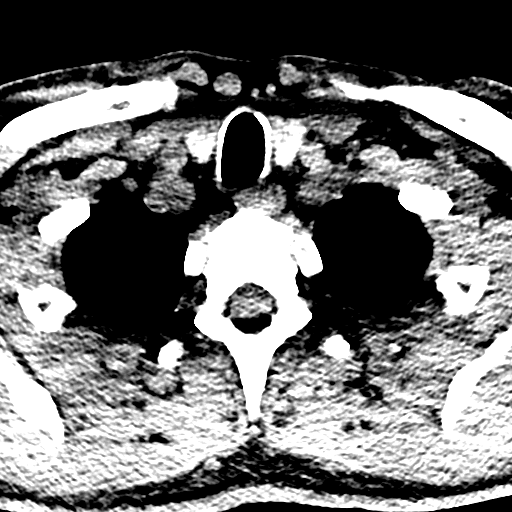
[im 19/100  brain]
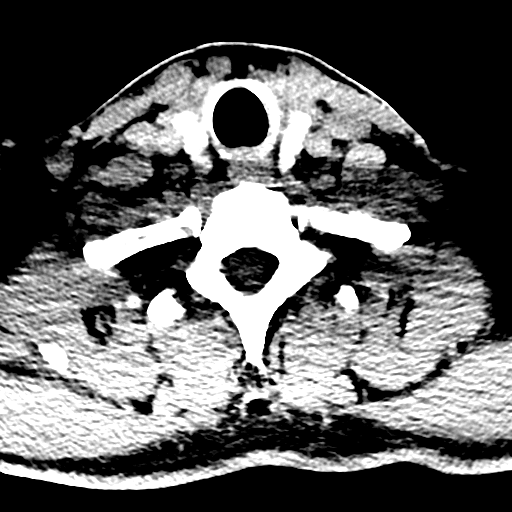

[Series 12: orthogonal bone · axial · 0.23mm/px · z∈[-325,-146]mm · 8 of 115 slices shown]
[im 9/115  bone]
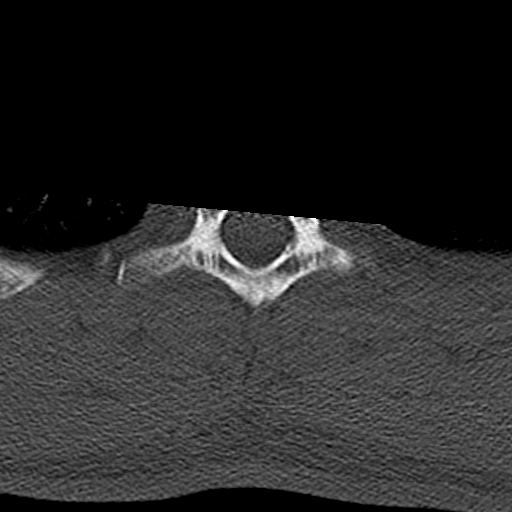
[im 27/115  bone]
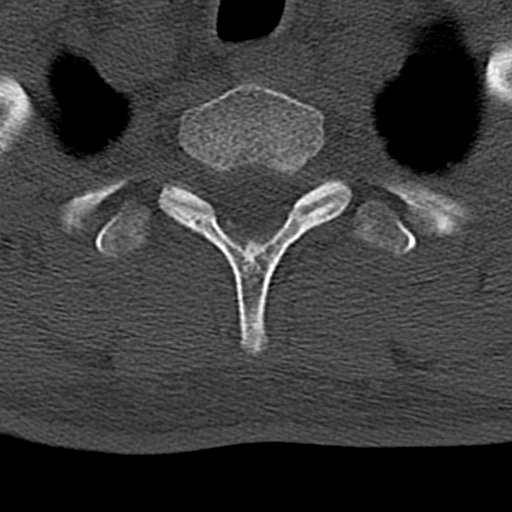
[im 36/115  bone]
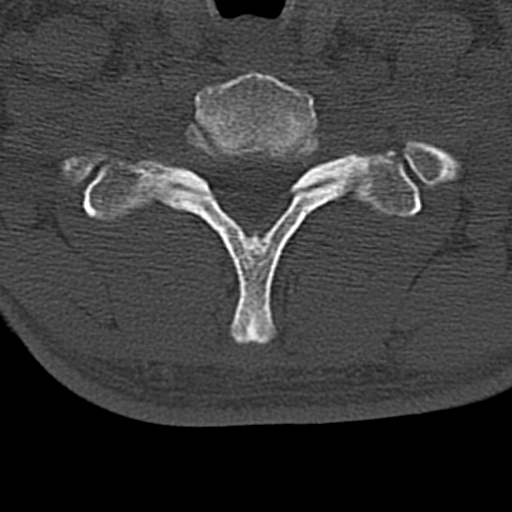
[im 53/115  bone]
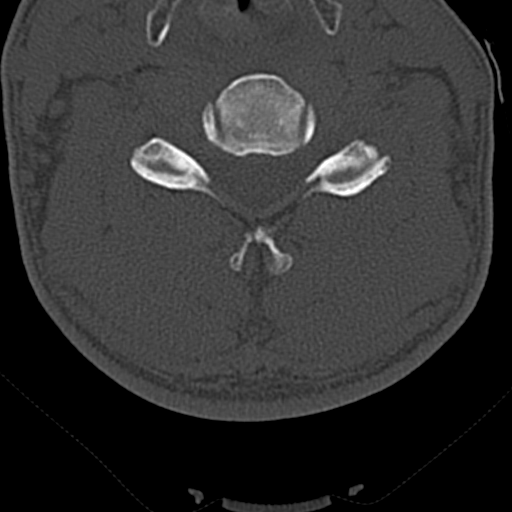
[im 62/115  bone]
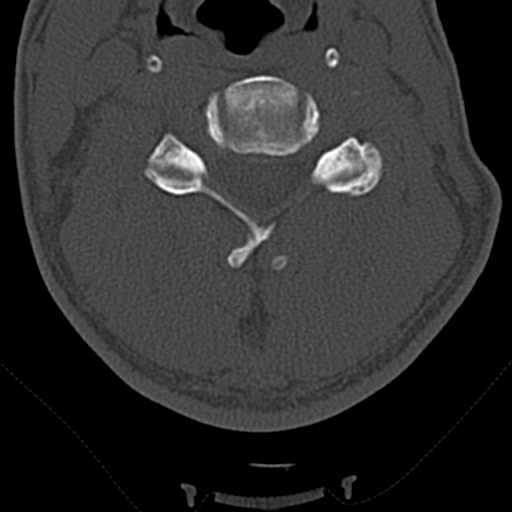
[im 79/115  bone]
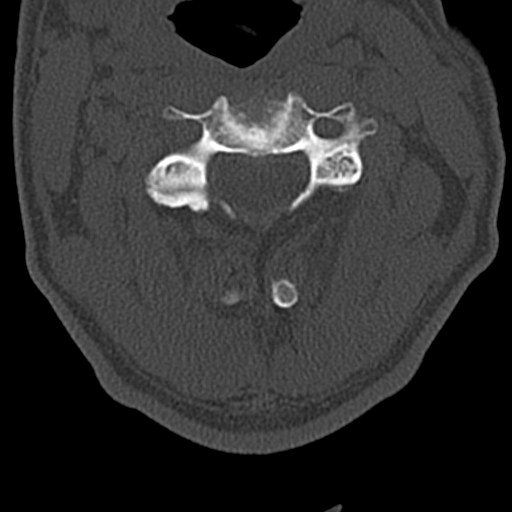
[im 88/115  bone]
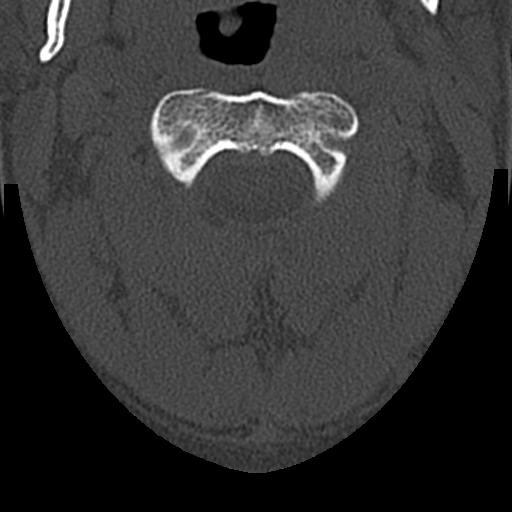
[im 106/115  bone]
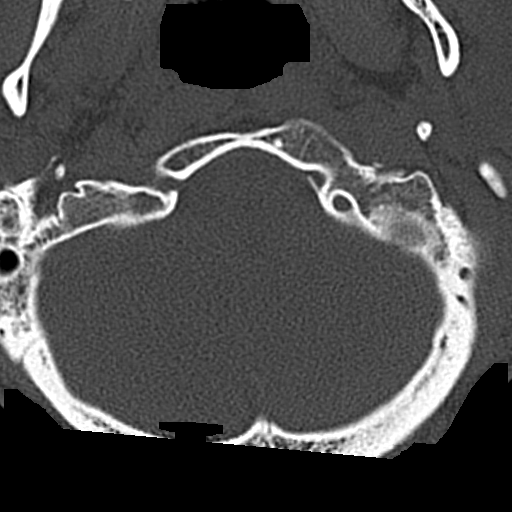

[17 of 47 positions shown; findings below may reference images not displayed]

FINDINGS: CT HEAD FINDINGS

Brain: No evidence of acute infarction, hemorrhage, hydrocephalus,
extra-axial collection or mass lesion/mass effect.

Vascular: No hyperdense vessel or unexpected calcification.

Skull: Normal. Negative for fracture or focal lesion.

Sinuses/Orbits: Stable left mastoid opacification. Normally aerated
paranasal sinuses. Orbits are unremarkable.

Other: None.

CT CERVICAL SPINE FINDINGS

Alignment: Normal.

Skull base and vertebrae: No acute fracture. No primary bone lesion
or focal pathologic process.

Soft tissues and spinal canal: No prevertebral fluid or swelling. No
visible canal hematoma.

Disc levels: Mild cervical discogenic degenerative changes with
small disc protrusions and a prominent disc bulge at the C6-7 level
eccentric to the left.

Upper chest: Negative.

Other: 7 mm nodule in right lobe of thyroid.
IMPRESSION: 1. No acute intracranial abnormality or displaced calvarial
fracture.
2. Stable left mastoid opacification.
3. No acute fracture or dislocation of the cervical spine.

By: Shin Crocker M.D.

## 2018-09-09 ENCOUNTER — Ambulatory Visit: Payer: Managed Care, Other (non HMO) | Admitting: Physician Assistant

## 2018-09-14 ENCOUNTER — Ambulatory Visit: Payer: Managed Care, Other (non HMO) | Admitting: Urology

## 2019-08-09 ENCOUNTER — Encounter: Payer: Self-pay | Admitting: Physician Assistant

## 2019-08-09 ENCOUNTER — Other Ambulatory Visit: Payer: Self-pay

## 2019-08-09 ENCOUNTER — Ambulatory Visit: Payer: Managed Care, Other (non HMO) | Admitting: Physician Assistant

## 2019-08-09 VITALS — BP 178/106 | HR 86 | Temp 97.3°F | Resp 16 | Ht 70.0 in | Wt 222.4 lb

## 2019-08-09 DIAGNOSIS — I5021 Acute systolic (congestive) heart failure: Secondary | ICD-10-CM

## 2019-08-09 DIAGNOSIS — E118 Type 2 diabetes mellitus with unspecified complications: Secondary | ICD-10-CM

## 2019-08-09 DIAGNOSIS — I10 Essential (primary) hypertension: Secondary | ICD-10-CM | POA: Diagnosis not present

## 2019-08-09 DIAGNOSIS — R079 Chest pain, unspecified: Secondary | ICD-10-CM

## 2019-08-09 DIAGNOSIS — I25118 Atherosclerotic heart disease of native coronary artery with other forms of angina pectoris: Secondary | ICD-10-CM

## 2019-08-09 DIAGNOSIS — E1159 Type 2 diabetes mellitus with other circulatory complications: Secondary | ICD-10-CM | POA: Diagnosis not present

## 2019-08-09 DIAGNOSIS — I200 Unstable angina: Secondary | ICD-10-CM

## 2019-08-09 DIAGNOSIS — I152 Hypertension secondary to endocrine disorders: Secondary | ICD-10-CM

## 2019-08-09 LAB — POCT GLYCOSYLATED HEMOGLOBIN (HGB A1C)
Est. average glucose Bld gHb Est-mCnc: 303
Hemoglobin A1C: 12.2 % — AB (ref 4.0–5.6)

## 2019-08-09 MED ORDER — CARVEDILOL 12.5 MG PO TABS: 12.5000 mg | ORAL_TABLET | Freq: Two times a day (BID) | ORAL | 3 refills | Status: DC

## 2019-08-09 MED ORDER — LOSARTAN POTASSIUM 100 MG PO TABS: 100.0000 mg | ORAL_TABLET | Freq: Every day | ORAL | 0 refills | Status: DC

## 2019-08-09 MED ORDER — FUROSEMIDE 40 MG PO TABS: 40.0000 mg | ORAL_TABLET | Freq: Every day | ORAL | 3 refills | Status: DC

## 2019-08-09 MED ORDER — ASPIRIN 81 MG PO CHEW: 81.0000 mg | CHEWABLE_TABLET | Freq: Every day | ORAL | 2 refills | Status: DC

## 2019-08-09 MED ORDER — SPIRONOLACTONE 25 MG PO TABS: 25.0000 mg | ORAL_TABLET | Freq: Every day | ORAL | 0 refills | Status: DC

## 2019-08-09 NOTE — Progress Notes (Signed)
Patient: Keith [name] Male    DOB: [birth year]   48 y.o.   MRN: [id number] Visit Date: 08/09/2019  Today's Provider: [name], PA-C   Chief Complaint  Patient presents with   Night Sweats   Diabetes   Hypertension   Subjective:     HPI   Patient is a 48 y/o man with systolic HF due to uncontrolled HTN last EF 30%, MI 2018, uncontrolled DM, tobacco abuse, noncompliant with medications and follow up presenting today for an episode of chest pain and sweating that occurred two weeks ago. He denies symptoms currently - denies ongoing chest pain, SOB, weakness, diaphoresis.   He reports that he has been having chest pain with sexual activity for a while now. One or two sundays ago he says he woke up with chest pain and diaphoresis.   He is noncompliant with medications and follow up. He is taking losartan and metformin. He is not taking aspirin, not taking diuretics or beta blocker. He is not taking additional diabetes medications.    Diabetes Mellitus Type II, Follow-up:   Lab Results  Component Value Date   HGBA1C 12.2 (A) 08/09/2019   HGBA1C 11.0 (A) 06/17/2018   HGBA1C 8.5 (H) 03/03/2018    Last seen for diabetes 1 years ago.  Management since then includes - metFORMIN (GLUCOPHAGE) 500 MG tablet; 500mg  daily x 1 wk. 500 mg twice daily x 1 wk. 1000 mg in the morning and 500 mg at night x 1 wk. 1000 mg twice daily x 1 wk. He reports excellent compliance with treatment. He is not having side effects.  Current symptoms include paresthesia of the feet and have been stable. Home blood sugar records: fasting range: not being checked  Episodes of hypoglycemia? no   Current insulin regiment: Is not on insulin Most Recent Eye Exam:  Weight trend: stable Prior visit with dietician: No Current exercise: none Current diet habits: not asked  Pertinent Labs:    Component Value Date/Time   CHOL 134 03/03/2018 0610   CHOL 170 02/11/2018 1117   TRIG 165 (H)  03/03/2018 0610   HDL 31 (L) 03/03/2018 0610   HDL 34 (L) 02/11/2018 1117   LDLCALC 70 03/03/2018 0610   LDLCALC 99 02/11/2018 1117   CREATININE 0.79 05/20/2018 1137   CREATININE 0.72 12/14/2016 0834    Wt Readings from Last 3 Encounters:  08/09/19 222 lb 6.4 oz (100.9 kg)  06/17/18 224 lb (101.6 kg)  05/20/18 225 lb 8 oz (102.3 kg)    ------------------------------------------------------------------------  Hypertension, follow-up:  BP Readings from Last 3 Encounters:  08/09/19 (!) 178/106  06/17/18 (!) 174/116  05/20/18 (!) 158/100    He was last seen for hypertension 1 years ago.  BP at that visit was 174/119. Management changes since that visit include no changes. He is not compliant with treatment recommendations.  He is not having side effects.  He is not exercising. He is not adherent to low salt diet.   Outside blood pressures are elevated. He is experiencing exertional chest pressure/discomfort and palpitations.  Patient denies lower extremity edema.   Cardiovascular risk factors include diabetes mellitus, hypertension, male gender, obesity (BMI >= 30 kg/m2) and smoking/ tobacco exposure.  Use of agents associated with hypertension: none.     Weight trend: stable Wt Readings from Last 3 Encounters:  08/09/19 222 lb 6.4 oz (100.9 kg)  06/17/18 224 lb (101.6 kg)  05/20/18 225 lb 8 oz (102.3 kg)  Current diet: not asked  ------------------------------------------------------------------------    Allergies  Allergen Reactions   Other Other (See Comments)    Muscle Relaxers (caused psychotic break)     Current Outpatient Medications:    losartan (COZAAR) 100 MG tablet, Take 1 tablet (100 mg total) by mouth daily., Disp: 90 tablet, Rfl: 0   metFORMIN (GLUCOPHAGE) 500 MG tablet, 500mg  daily x 1 wk. 500 mg twice daily x 1 wk. 1000 mg in the morning and 500 mg at night x 1 wk. 1000 mg twice daily x 1 wk., Disp: 180 tablet, Rfl: 3   aspirin 81 MG  chewable tablet, Chew 1 tablet (81 mg total) by mouth daily., Disp: 30 tablet, Rfl: 2   atorvastatin (LIPITOR) 40 MG tablet, Take 1 tablet (40 mg total) by mouth daily at 6 PM., Disp: 90 tablet, Rfl: 0   carvedilol (COREG) 12.5 MG tablet, Take 1 tablet (12.5 mg total) by mouth 2 (two) times daily., Disp: 180 tablet, Rfl: 3   furosemide (LASIX) 40 MG tablet, Take 1 tablet (40 mg total) by mouth daily., Disp: 90 tablet, Rfl: 3   Omeprazole 20 MG TBEC, Take 2 tablets (40 mg total) by mouth daily., Disp: 90 each, Rfl:    potassium chloride SA (K-DUR,KLOR-CON) 20 MEQ tablet, Take 1 tablet (20 mEq total) by mouth daily., Disp: 90 tablet, Rfl: 4   sertraline (ZOLOFT) 50 MG tablet, Take 1 tablet (50 mg total) by mouth daily., Disp: 90 tablet, Rfl: 0   sildenafil (VIAGRA) 50 MG tablet, Take 1 tablet (50 mg total) by mouth daily as needed for erectile dysfunction. (Patient not taking: Reported on 06/17/2018), Disp: 15 tablet, Rfl: 3   spironolactone (ALDACTONE) 25 MG tablet, Take 1 tablet (25 mg total) by mouth daily., Disp: 90 tablet, Rfl: 0  Review of Systems  Constitutional: Positive for diaphoresis.  HENT: Positive for congestion.   Eyes: Positive for visual disturbance.  Respiratory: Positive for apnea, cough, choking, chest tightness, shortness of breath and wheezing.   Cardiovascular: Positive for palpitations.  Gastrointestinal: Positive for vomiting.    Social History   Tobacco Use   Smoking status: Current Every Day Smoker    Packs/day: 1.00    Types: Cigarettes   Smokeless tobacco: Never Used  Substance Use Topics   Alcohol use: Yes    Comment: Occasionally       Objective:   BP (!) 178/106 (BP Location: Right Arm, Patient Position: Sitting, Cuff Size: Large)    Pulse 86    Temp (!) 97.3 F (36.3 C) (Temporal)    Resp 16    Ht 5\' 10"  (1.778 m)    Wt 222 lb 6.4 oz (100.9 kg)    SpO2 96%    BMI 31.91 kg/m  Vitals:   08/09/19 0847  BP: (!) 178/106  Pulse: 86  Resp: 16    Temp: (!) 97.3 F (36.3 C)  TempSrc: Temporal  SpO2: 96%  Weight: 222 lb 6.4 oz (100.9 kg)  Height: 5\' 10"  (1.778 m)  Body mass index is 31.91 kg/m.   Physical Exam Constitutional:      General: He is not in acute distress.    Appearance: Normal appearance. He is not ill-appearing or diaphoretic.  Cardiovascular:     Rate and Rhythm: Normal rate and regular rhythm.     Heart sounds: Normal heart sounds.  Pulmonary:     Effort: Pulmonary effort is normal.     Breath sounds: Normal breath sounds.  Skin:  General: Skin is warm and dry.  Neurological:     Mental Status: He is alert.  Psychiatric:        Mood and Affect: Mood normal.        Behavior: Behavior normal.      Results for orders placed or performed in visit on 08/09/19  POCT glycosylated hemoglobin (Hb A1C)  Result Value Ref Range   Hemoglobin A1C 12.2 (A) 4.0 - 5.6 %   Est. average glucose Bld gHb Est-mCnc 303        Assessment & Plan    1. Chest pain, unspecified type  Patient had episode of chest pain two weeks ago. EKG with ST elevations that appear larger than last time though patient is asymptomatic and does not appear in distress currently. History of MI, uncontrolled HTN, uncontrolled DM II and noncompliant with most if not all of his medications. Has not been seen in a year. Have communicated with Nicolasa Ducking, NP about his EKG today. Agreed on having patient follow up in cardiology clinic.   I have counseled patient that he should restart his Aspirin and his blood pressure medications.   2. DM (diabetes mellitus) with complications [facility])  Patient is habitually noncompliant. He doesn't take his medications and he doesn't express any particular reason why. Diabetes is extremely uncontrolled putting him at risk for numerous complications including neurologic, ophthalmic, renal and CV. Have explained all of this to patient on numerous occasions in clinic but patient remains noncompliant. I  recommend he follow up for his diabetes but I will leave it up to him to schedule as I do not think anything meaningful will be accomplished until he has motivation to adhere to treatment which I frankly do not see today.  - POCT glycosylated hemoglobin (Hb A1C) - POCT UA - Microalbumin - Comprehensive Metabolic Panel (CMET) - Lipid Profile - CBC with Differential  3. Hypertension associated with diabetes (HCC)  Uncontrolled. Noncompliant with medications. Las EF 30% with non-ischemic cardiomyopathy.   - EKG 12-Lead - carvedilol (COREG) 12.5 MG tablet; Take 1 tablet (12.5 mg total) by mouth 2 (two) times daily.  Dispense: 180 tablet; Refill: 3 - losartan (COZAAR) 100 MG tablet; Take 1 tablet (100 mg total) by mouth daily.  Dispense: 90 tablet; Refill: 0 - spironolactone (ALDACTONE) 25 MG tablet; Take 1 tablet (25 mg total) by mouth daily.  Dispense: 90 tablet; Refill: 0 - Comprehensive Metabolic Panel (CMET) - Lipid Profile - CBC with Differential - aspirin 81 MG chewable tablet; Chew 1 tablet (81 mg total) by mouth daily.  Dispense: 30 tablet; Refill: 2  4. Acute systolic heart failure (HCC)  - furosemide (LASIX) 40 MG tablet; Take 1 tablet (40 mg total) by mouth daily.  Dispense: 90 tablet; Refill: 3 - spironolactone (ALDACTONE) 25 MG tablet; Take 1 tablet (25 mg total) by mouth daily.  Dispense: 90 tablet; Refill: 0 - Comprehensive Metabolic Panel (CMET) - Lipid Profile - CBC with Differential  5. Unstable angina [facility])  Following up with cardiology in clinic.  The entirety of the information documented in the History of Present Illness, Review of Systems and Physical Exam were personally obtained by me. Portions of this information were initially documented by Rondel Baton, CMA and reviewed by me for thoroughness and accuracy.       [name], PA-C  [facility]

## 2019-08-09 NOTE — Patient Instructions (Signed)

## 2019-08-10 LAB — COMPREHENSIVE METABOLIC PANEL
ALT: 30 IU/L (ref 0–44)
AST: 20 IU/L (ref 0–40)
Albumin/Globulin Ratio: 1.6 (ref 1.2–2.2)
Albumin: 4.3 g/dL (ref 4.0–5.0)
Alkaline Phosphatase: 101 IU/L (ref 39–117)
BUN/Creatinine Ratio: 8 — ABNORMAL LOW (ref 9–20)
BUN: 6 mg/dL (ref 6–24)
Bilirubin Total: 1.3 mg/dL — ABNORMAL HIGH (ref 0.0–1.2)
CO2: 26 mmol/L (ref 20–29)
Calcium: 10 mg/dL (ref 8.7–10.2)
Chloride: 96 mmol/L (ref 96–106)
Creatinine, Ser: 0.75 mg/dL — ABNORMAL LOW (ref 0.76–1.27)
GFR calc Af Amer: 125 mL/min/{1.73_m2} (ref >59)
GFR calc non Af Amer: 108 mL/min/{1.73_m2} (ref >59)
Globulin, Total: 2.7 g/dL (ref 1.5–4.5)
Glucose: 288 mg/dL — ABNORMAL HIGH (ref 65–99)
Potassium: 4.3 mmol/L (ref 3.5–5.2)
Sodium: 137 mmol/L (ref 134–144)
Total Protein: 7 g/dL (ref 6.0–8.5)

## 2019-08-10 LAB — CBC WITH DIFFERENTIAL/PLATELET
Basophils Absolute: 0 10*3/uL (ref 0.0–0.2)
Basos: 0 %
EOS (ABSOLUTE): 0.1 10*3/uL (ref 0.0–0.4)
Eos: 2 %
Hematocrit: 45.7 % (ref 37.5–51.0)
Hemoglobin: 15.1 g/dL (ref 13.0–17.7)
Immature Grans (Abs): 0 10*3/uL (ref 0.0–0.1)
Immature Granulocytes: 0 %
Lymphocytes Absolute: 1.2 10*3/uL (ref 0.7–3.1)
Lymphs: 17 %
MCH: 28.9 pg (ref 26.6–33.0)
MCHC: 33 g/dL (ref 31.5–35.7)
MCV: 87 fL (ref 79–97)
Monocytes Absolute: 0.3 10*3/uL (ref 0.1–0.9)
Monocytes: 5 %
Neutrophils Absolute: 5.2 10*3/uL (ref 1.4–7.0)
Neutrophils: 76 %
Platelets: 195 10*3/uL (ref 150–450)
RBC: 5.23 x10E6/uL (ref 4.14–5.80)
RDW: 12.8 % (ref 11.6–15.4)
WBC: 6.9 10*3/uL (ref 3.4–10.8)

## 2019-08-10 LAB — LIPID PANEL
Chol/HDL Ratio: 5.3 ratio — ABNORMAL HIGH (ref 0.0–5.0)
Cholesterol, Total: 171 mg/dL (ref 100–199)
HDL: 32 mg/dL — ABNORMAL LOW (ref >39)
LDL Chol Calc (NIH): 96 mg/dL (ref 0–99)
Triglycerides: 255 mg/dL — ABNORMAL HIGH (ref 0–149)
VLDL Cholesterol Cal: 43 mg/dL — ABNORMAL HIGH (ref 5–40)

## 2019-08-25 ENCOUNTER — Other Ambulatory Visit: Payer: Self-pay | Admitting: Physician Assistant

## 2019-08-25 ENCOUNTER — Encounter: Payer: Self-pay | Admitting: Nurse Practitioner

## 2019-08-25 ENCOUNTER — Ambulatory Visit (INDEPENDENT_AMBULATORY_CARE_PROVIDER_SITE_OTHER): Payer: Managed Care, Other (non HMO) | Admitting: Nurse Practitioner

## 2019-08-25 ENCOUNTER — Other Ambulatory Visit: Payer: Self-pay

## 2019-08-25 VITALS — BP 150/100 | HR 80 | Temp 98.0°F | Ht 70.0 in | Wt 215.8 lb

## 2019-08-25 DIAGNOSIS — I428 Other cardiomyopathies: Secondary | ICD-10-CM | POA: Diagnosis not present

## 2019-08-25 DIAGNOSIS — R0683 Snoring: Secondary | ICD-10-CM

## 2019-08-25 DIAGNOSIS — I10 Essential (primary) hypertension: Secondary | ICD-10-CM

## 2019-08-25 DIAGNOSIS — E782 Mixed hyperlipidemia: Secondary | ICD-10-CM | POA: Diagnosis not present

## 2019-08-25 DIAGNOSIS — I5042 Chronic combined systolic (congestive) and diastolic (congestive) heart failure: Secondary | ICD-10-CM

## 2019-08-25 DIAGNOSIS — E1159 Type 2 diabetes mellitus with other circulatory complications: Secondary | ICD-10-CM

## 2019-08-25 DIAGNOSIS — I152 Hypertension secondary to endocrine disorders: Secondary | ICD-10-CM

## 2019-08-25 DIAGNOSIS — E118 Type 2 diabetes mellitus with unspecified complications: Secondary | ICD-10-CM

## 2019-08-25 DIAGNOSIS — E119 Type 2 diabetes mellitus without complications: Secondary | ICD-10-CM

## 2019-08-25 MED ORDER — METFORMIN HCL 500 MG PO TABS: ORAL_TABLET | ORAL | 3 refills | Status: DC

## 2019-08-25 NOTE — Progress Notes (Signed)
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  Past Medical History    Past Medical History:  Diagnosis Date   Chronic combined systolic (congestive) and diastolic (congestive) heart failure (HCC)    a. 06/2017 Echo: EF 35-40%, Gr1 DD; b. 02/2018 Echo: Ef 30-35%; c. 03/2018 Echo: EF 35-40%, mid-apicalanteroseptal, ant, and apical HK. Mildly dil LA. No LV thrombus.   Diabetes mellitus    Erectile dysfunction    Essential hypertension    Frequent headaches    NICM (nonischemic cardiomyopathy) (HCC)    a. 06/2017 Echo: EF 35-40%, Gr1 DD; b. 06/2017 Cath: nonobs dzs; c. 02/2018 Echo: Ef 30-35%; d. 03/2018 Echo: EF 35-40%, mid-apicalanteroseptal, ant, and apical HK. Mildly dil LA. No LV thrombus.   Non-obstructive Coronary Artery Disease    a. 06/2017 Cath: LM nl, LAD nl, D1/2/3 nl, LCX 30p, OM1 nl, OM2 min irregs, OM3/4 nl, RCA 30p/m, RPL2/3 nl.   TIA (transient ischemic attack)    a. 02/2018 MRA w/o acute findings; b. 02/2018 Carotis U/S: no hemodynamically significant stenoses.   Tobacco abuse    Past Surgical History:  Procedure Laterality Date   COLONOSCOPY WITH PROPOFOL N/A 02/19/2017   Procedure: COLONOSCOPY WITH PROPOFOL;  Surgeon: [name], MD;  Location: [facility] ENDOSCOPY;  Service: Endoscopy;  Laterality: N/A;   ESOPHAGOGASTRODUODENOSCOPY (EGD) WITH PROPOFOL N/A 02/19/2017   Procedure: ESOPHAGOGASTRODUODENOSCOPY (EGD) WITH PROPOFOL;  Surgeon: [name], MD;  Location: [facility] ENDOSCOPY;  Service: Endoscopy;  Laterality: N/A;   LEFT HEART CATH AND CORONARY ANGIOGRAPHY N/A 07/11/2017     Procedure: LEFT HEART CATH AND CORONARY ANGIOGRAPHY;  Surgeon: [name], MD;  Location: [facility] INVASIVE CV LAB;  Service: Cardiovascular;  Laterality: N/A;    Allergies  Allergies  Allergen Reactions   Other Other (See Comments)    Muscle Relaxers (caused psychotic break)    History of Present Illness    48 year old male with above complex past medical history including hypertension, hyperlipidemia, tobacco abuse, nonischemic cardiomyopathy, combined heart failure, erectile dysfunction, and noncompliance.  In August 2018, he was admitted to [facility] with dyspnea and hypertensive urgency.  EF was 35 to 40%.  Catheterization revealed nonobstructive CAD.  He was placed on medical therapy.  He was not able to afford Entresto and was maintained on beta-blocker, ARB, Lasix, and spironolactone.  He was lost to follow-up but was admitted to [facility] in April 2019 with slurred speech and TIA.  Echo during hospital stay showed an EF of 30 to 35%.  LV thrombus could not be ruled out however outpatient follow-up echo with [facility] did not show any LV thrombus.  EF was 35 to 40% on that follow-up echo.    He was last seen in cardiology clinic in July 2019 and says that since then, he has been noticing increasing dyspnea on exertion and also significant dyspnea and diaphoresis during periods of sexual intercourse.  He had come off of all of his medications several months ago.  He was seen by primary care on September 23 he was noted to be hypertensive with a pressure of 178/106.  There is also concern regarding ST and T changes in the setting  of left bundle branch block.  I spoke to his primary care provider that day and we agreed to resume some of his medications and have him follow-up with [place].  His carvedilol, losartan, Lasix, spironolactone, aspirin, atorvastatin, and Metformin were all resumed and he has been taking them daily.  He has not been taking carvedilol twice a day as he  thought it was just a once a day medicine.  He says that since starting back on medication, he has noticed improvement in activity tolerance.  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  He does experience some dyspnea with walking up stairs but otherwise does well.  He denies chest pain, palpitations, PND, orthopnea, dizziness, edema, or early satiety.  He notes occasionally that he will awake at night short of breath and thinks that he does snore.  On some of those occasions, he becomes nauseated and has vomiting and says that he has previously been told he has gastroparesis (normal gastric emptying study in 2018) and he is aware that if he eats too much, he will probably vomit at some point during the day.  At least twice in his life, he had a significant vomiting spell followed by syncope.  Home Medications    Prior to Admission medications   Medication Sig Start Date End Date Taking? Authorizing Provider  aspirin 81 MG chewable tablet Chew 1 tablet (81 mg total) by mouth daily. 08/09/19  Yes Carles Collet M, PA-C  atorvastatin (LIPITOR) 40 MG tablet Take 1 tablet (40 mg total) by mouth daily at 6 PM. 06/17/18  Yes Terrilee Croak, Adriana M, PA-C  carvedilol (COREG) 12.5 MG tablet Take 1 tablet (12.5 mg total) by mouth 2 (two) times daily. 08/09/19 11/07/19 Yes Trinna Post, PA-C  furosemide (LASIX) 40 MG tablet Take 1 tablet (40 mg total) by mouth daily. 08/09/19  Yes Carles Collet M, PA-C  losartan (COZAAR) 100 MG tablet Take 1 tablet (100 mg total) by mouth daily. 08/09/19  Yes Carles Collet M, PA-C  metFORMIN (GLUCOPHAGE) 500 MG tablet 500mg  daily x 1 wk. 500 mg twice daily x 1 wk. 1000 mg in the morning and 500 mg at night x 1 wk. 1000 mg twice daily x 1 wk. Patient taking differently: 500 mg 2 (two) times daily with a meal. 500mg  daily x 1 wk. 500 mg twice daily x 1 wk. 1000 mg in the morning and 500 mg at night x 1 wk. 1000 mg twice daily x 1 wk. 06/17/18  Yes Carles Collet M,  PA-C  spironolactone (ALDACTONE) 25 MG tablet Take 1 tablet (25 mg total) by mouth daily. 08/09/19  Yes Carles Collet M, PA-C  Omeprazole 20 MG TBEC Take 2 tablets (40 mg total) by mouth daily. Patient not taking: Reported on 08/25/2019 06/17/18   Trinna Post, PA-C  potassium chloride SA (K-DUR,KLOR-CON) 20 MEQ tablet Take 1 tablet (20 mEq total) by mouth daily. Patient not taking: Reported on 08/25/2019 06/17/18   Trinna Post, PA-C  sertraline (ZOLOFT) 50 MG tablet Take 1 tablet (50 mg total) by mouth daily. Patient not taking: Reported on 08/25/2019 06/17/18   Trinna Post, PA-C  sildenafil (VIAGRA) 50 MG tablet Take 1 tablet (50 mg total) by mouth daily as needed for erectile dysfunction. Patient not taking: Reported on 06/17/2018 05/20/18   Theora Gianotti, NP    Review of Systems    Some dyspnea on exertion with inclines.  He snores and sometimes awakes feeling  short of breath.  Somewhat chronic nausea, especially in the setting of overeating that is associated with vomiting.  He denies chest pain, palpitations, PND, orthopnea, dizziness, edema, or early satiety.  All other systems reviewed and are otherwise negative except as noted above.  Physical Exam    VS:  BP (!) 150/100 (BP Location: Left Arm, Patient Position: Sitting, Cuff Size: Normal)    Pulse 80    Temp 98 F (36.7 C)    Ht 5\' 10"  (1.778 m)    Wt 215 lb 12 oz (97.9 kg)    SpO2 96%    BMI 30.96 kg/m  , BMI Body mass index is 30.96 kg/m. GEN: Well nourished, well developed, in no acute distress. HEENT: normal. Neck: Supple, no JVD, carotid bruits, or masses. Cardiac: RRR, no murmurs, rubs, or gallops. No clubbing, cyanosis, edema.  Radials/PT 2+ and equal bilaterally.  Respiratory:  Respirations regular and unlabored, clear to auscultation bilaterally. GI: Soft, nontender, nondistended, BS + x 4. MS: no deformity or atrophy. Skin: warm and dry, no rash. Neuro:  Strength and sensation are intact. Psych:  Normal affect.  Accessory Clinical Findings    ECG personally reviewed by me today -regular sinus rhythm, 80, rightward axis with a left bundle branch block and repolarization abnormalities- no acute changes.  Lab Results  Component Value Date   WBC 6.9 08/09/2019   HGB 15.1 08/09/2019   HCT 45.7 08/09/2019   MCV 87 08/09/2019   PLT 195 08/09/2019   Lab Results  Component Value Date   CREATININE 0.75 (L) 08/09/2019   BUN 6 08/09/2019   NA 137 08/09/2019   K 4.3 08/09/2019   CL 96 08/09/2019   CO2 26 08/09/2019   Lab Results  Component Value Date   ALT 30 08/09/2019   AST 20 08/09/2019   ALKPHOS 101 08/09/2019   BILITOT 1.3 (H) 08/09/2019   Lab Results  Component Value Date   CHOL 171 08/09/2019   HDL 32 (L) 08/09/2019   LDLCALC 96 08/09/2019   TRIG 255 (H) 08/09/2019   CHOLHDL 5.3 (H) 08/09/2019     Assessment & Plan    1.  Nonischemic cardiomyopathy/chronic combined systolic and diastolic congestive heart failure: Previously diagnosed with a nonischemic cardiomyopathy in 2018 in the setting of hypertensive urgency.  EF at that time was 35 to 40%.  Last echo in 2019 also showed an EF of 30 to 35%.  Patient had come off of all of his medications since his last cardiology visit and had noted increasing dyspnea on exertion.  He was seen by primary care September 23 and was hypertensive.  Was placed back on prior medications and returns today stating that he has noted significant improvement in activity tolerance.  He has not any labs since being placed back on multiple medications.  I will plan to follow basic metabolic panel today given restart of Lasix, losartan, and spironolactone.  He has only been taking his carvedilol once a day and I have advised that he needs to take this twice a day.  His blood pressure is elevated today and hopefully by taking this appropriately, this will make a difference.  He previously could not afford Entresto.  If pressures remain elevated at  follow-up, would plan to titrate carvedilol to 25 mg twice daily if possible.  Could consider addition of BiDil for additional afterload reduction.  Ultimately, provided that he remains compliant, will plan to check an echo in 2 to 3 months and if  EF still less than 35%, would refer to electrophysiology for consideration of ICD/CRT-D placement.  2.  Essential hypertension: Blood pressure elevated today at 150/100.  He has been tolerating his medications and I am checking a basic metabolic panel today.  He has only been taking his carvedilol once a day and I asked him to do this twice a day.  3.  Hyperlipidemia: Last LDL was 96 on September 23.  Statin was resumed that day.  4.  Type 2 diabetes mellitus: Poorly controlled in the setting of noncompliance.  A1c was 12.2 in September 23.  Metformin was resumed that day.  5.  Tobacco abuse: Smoking a pack a day.  Complete cessation advised.  6.  Snoring: Frequently awakes at night feeling short of breath.  His wife told him that he snores.  Will arrange for sleep study.  7.  Chronic nausea with vomiting: He says this has been a long-term issue and is worsened by overeating.  He has been told in the past 2 eat smaller frequent meals.  He says he has previous been evaluated by GI and I do see that he had a gastric emptying study in 2018, which was normal.  Recommended that he follow-up with GI.    8. Disposition: Follow-up basic metabolic panel today.  Follow-up in clinic in 1 month or sooner if necessary with plan to titrate beta-blocker further at that time if possible.   Nicolasa Duckinghristopher Toi Stelly, NP 08/25/2019, 1:19 PM

## 2019-08-25 NOTE — Telephone Encounter (Signed)
Pt needing refills on:  metFORMIN (GLUCOPHAGE) 500 MG tablet   Please fill at:  [place] [address], [place] [phone] (Phone) [phone] (Fax)   Thanks, [facility]

## 2019-08-25 NOTE — Patient Instructions (Signed)
Medication Instructions:  1- Coreg Make sure you are taking 1 tablet (12.5 mg total) TWICE daily.  If you need a refill on your cardiac medications before your next appointment, please call your pharmacy.   Lab work: Your physician recommends that you have lab work today(BMET)  If you have labs (blood work) drawn today and your tests are completely normal, you will receive your results only by: [name] Kitchen MyChart Message (if you have MyChart) OR . A paper copy in the mail If you have any lab test that is abnormal or we need to change your treatment, we will call you to review the results.  Testing/Procedures: None ordered   Follow-Up: At [facility], you and your health needs are our priority.  As part of our continuing mission to provide you with exceptional heart care, we have created designated Provider Care Teams.  These Care Teams include your primary Cardiologist (physician) and Advanced Practice Providers (APPs -  Physician Assistants and Nurse Practitioners) who all work together to provide you with the care you need, when you need it. You will need a follow up appointment in 1 months. You may see Ida Rogue, MD or Murray Hodgkins, NP.   Any Other Special Instructions Will Be Listed Below (If Applicable). Ref for sleep study

## 2019-08-26 LAB — BASIC METABOLIC PANEL
BUN/Creatinine Ratio: 11 (ref 9–20)
BUN: 10 mg/dL (ref 6–24)
CO2: 27 mmol/L (ref 20–29)
Calcium: 10.1 mg/dL (ref 8.7–10.2)
Chloride: 95 mmol/L — ABNORMAL LOW (ref 96–106)
Creatinine, Ser: 0.93 mg/dL (ref 0.76–1.27)
GFR calc Af Amer: 112 mL/min/{1.73_m2} (ref >59)
GFR calc non Af Amer: 97 mL/min/{1.73_m2} (ref >59)
Glucose: 320 mg/dL — ABNORMAL HIGH (ref 65–99)
Potassium: 4.3 mmol/L (ref 3.5–5.2)
Sodium: 137 mmol/L (ref 134–144)

## 2019-08-29 NOTE — Progress Notes (Signed)
Cardiology Office Note    Date:  09/05/2019   ID:  Keith, [name] 27-Oct-1971, MRN [id number]  PCP:  [name], PA-C  Cardiologist:  Julien Nordmann, MD  Electrophysiologist:  None   Chief Complaint: Follow up  History of Present Illness:   Keith [name] is a 48 y.o. male with history of NICM, chronic combined systolic and diastolic CHF, poorly controlled diabetes, HTN, HLD, tobacco absue, ED, and noncompliance who presents for follow up.   He was admitted to the hospital in 06/2017 with dyspnea and hypertensive urgency.  EF was 35 to 40%.  Diagnostic cardiac cath revealed nonobstructive CAD.  Medical therapy was recommended.  He was unable to afford Entresto and was maintained on beta-blocker, ARB, spironolactone, and Lasix.  Following that, he was lost to follow-up and was readmitted in 02/2018 with slurred speech and TIA.  Echo during that admission showed an EF of 30 to 35%.  LV thrombus could not be ruled out, however outpatient follow-up echo with Definity did not show any evidence of LV thrombus.  EF was 35 to 40% on the follow-up outpatient echo.  She was most recently seen on 08/25/2019 and reported an increase in exertional dyspnea as well as diaphoresis during periods of sexual intercourse dating back to his visit in 05/2018.  He had come off all his medications several months prior.  He was seen by PCP in 07/2019 and noted to be hypertensive with a blood pressure of 178/106.  Medications including Coreg, losartan, Lasix, spironolactone, aspirin, Lipitor, and metformin were resumed and patient reported compliance when he was seen by our office in early 08/2019.  However, he was taking Coreg only once per day.  Since starting back on medications he noted some improvement in his activity tolerance.  Lastly, he noted some potential apneic episodes at nighttime with associated possible snoring as well as some occasions of nausea/vomiting, particularly when he eats too much.   Of note, patient had a normal gastric emptying study in 2018.  He was advised to take Coreg twice per day.  He comes in doing very well from a cardiac perspective.  He has been compliant with all medications since last seen.  He continues to note improvement and functional status now that his medications have been resumed.  He is not checking his blood pressure at home.  He has not yet undergone a sleep study.  No chest pain, shortness of breath, palpitations, dizziness, presyncope, syncope, lower extremity swelling, abdominal surgery, orthopnea, PND, early satiety.   Labs: 08/2019 - BUN 10, SCr 0.93, potassium 4 3, glucose 320  Past Medical History:  Diagnosis Date   Chronic combined systolic (congestive) and diastolic (congestive) heart failure (HCC)    a. 06/2017 Echo: EF 35-40%, Gr1 DD; b. 02/2018 Echo: Ef 30-35%; c. 03/2018 Echo: EF 35-40%, mid-apicalanteroseptal, ant, and apical HK. Mildly dil LA. No LV thrombus.   Diabetes mellitus    Erectile dysfunction    Essential hypertension    Frequent headaches    NICM (nonischemic cardiomyopathy) (HCC)    a. 06/2017 Echo: EF 35-40%, Gr1 DD; b. 06/2017 Cath: nonobs dzs; c. 02/2018 Echo: Ef 30-35%; d. 03/2018 Echo: EF 35-40%, mid-apicalanteroseptal, ant, and apical HK. Mildly dil LA. No LV thrombus.   Non-obstructive Coronary Artery Disease    a. 06/2017 Cath: LM nl, LAD nl, D1/2/3 nl, LCX 30p, OM1 nl, OM2 min irregs, OM3/4 nl, RCA 30p/m, RPL2/3 nl.   TIA (transient ischemic attack)  a. 02/2018 MRA w/o acute findings; b. 02/2018 Carotis U/S: no hemodynamically significant stenoses.   Tobacco abuse     Past Surgical History:  Procedure Laterality Date   COLONOSCOPY WITH PROPOFOL N/A 02/19/2017   Procedure: COLONOSCOPY WITH PROPOFOL;  Surgeon: Wyline MoodKiran Anna, MD;  Location: [facility] ENDOSCOPY;  Service: Endoscopy;  Laterality: N/A;   ESOPHAGOGASTRODUODENOSCOPY (EGD) WITH PROPOFOL N/A 02/19/2017   Procedure: ESOPHAGOGASTRODUODENOSCOPY (EGD) WITH  PROPOFOL;  Surgeon: Wyline MoodKiran Anna, MD;  Location: [facility] ENDOSCOPY;  Service: Endoscopy;  Laterality: N/A;   LEFT HEART CATH AND CORONARY ANGIOGRAPHY N/A 07/11/2017   Procedure: LEFT HEART CATH AND CORONARY ANGIOGRAPHY;  Surgeon: Iran OuchArida, Muhammad A, MD;  Location: [facility] INVASIVE CV LAB;  Service: Cardiovascular;  Laterality: N/A;    Current Medications: Current Meds  Medication Sig   aspirin 81 MG chewable tablet Chew 1 tablet (81 mg total) by mouth daily.   atorvastatin (LIPITOR) 40 MG tablet TAKE 1 TABLET BY MOUTH ONCE DAILY AT 6PM   carvedilol (COREG) 12.5 MG tablet Take 1 tablet (12.5 mg total) by mouth 2 (two) times daily.   furosemide (LASIX) 40 MG tablet Take 1 tablet (40 mg total) by mouth daily.   losartan (COZAAR) 100 MG tablet Take 1 tablet (100 mg total) by mouth daily.   metFORMIN (GLUCOPHAGE) 500 MG tablet 500mg  daily x 1 wk. 500 mg twice daily x 1 wk. 1000 mg in the morning and 500 mg at night x 1 wk. 1000 mg twice daily x 1 wk.   spironolactone (ALDACTONE) 25 MG tablet Take 1 tablet (25 mg total) by mouth daily.    Allergies:   Other   Social History   Socioeconomic History   Marital status: Married    Spouse name: Not on file   Number of children: Not on file   Years of education: Not on file   Highest education level: Not on file  Occupational History   Not on file  Social Needs   Financial resource strain: Not on file   Food insecurity    Worry: Not on file    Inability: Not on file   Transportation needs    Medical: Not on file    Non-medical: Not on file  Tobacco Use   Smoking status: Current Every Day Smoker    Packs/day: 1.00    Types: Cigarettes   Smokeless tobacco: Never Used  Substance and Sexual Activity   Alcohol use: Yes    Comment: Occasionally    Drug use: No   Sexual activity: Yes  Lifestyle   Physical activity    Days per week: Not on file    Minutes per session: Not on file   Stress: Not on file  Relationships    Social connections    Talks on phone: Not on file    Gets together: Not on file    Attends religious service: Not on file    Active member of club or organization: Not on file    Attends meetings of clubs or organizations: Not on file    Relationship status: Not on file  Other Topics Concern   Not on file  Social History Narrative   Not on file     Family History:  The patient's family history includes Diabetes in his father and mother; Hypertension in his father and mother.  ROS:   Review of Systems  Constitutional: Positive for malaise/fatigue. Negative for chills, diaphoresis, fever and weight loss.       Improving fatigue  HENT:  Negative for congestion.   Eyes: Negative for discharge and redness.  Respiratory: Negative for cough, hemoptysis, sputum production, shortness of breath and wheezing.   Cardiovascular: Negative for chest pain, palpitations, orthopnea, claudication, leg swelling and PND.  Gastrointestinal: Negative for abdominal pain, blood in stool, heartburn, melena, nausea and vomiting.  Genitourinary: Negative for hematuria.  Musculoskeletal: Negative for falls and myalgias.  Skin: Negative for rash.  Neurological: Negative for dizziness, tingling, tremors, sensory change, speech change, focal weakness, loss of consciousness and weakness.  Endo/Heme/Allergies: Does not bruise/bleed easily.  Psychiatric/Behavioral: Negative for substance abuse. The patient is not nervous/anxious.   All other systems reviewed and are negative.    EKGs/Labs/Other Studies Reviewed:    Studies reviewed were summarized above. The additional studies were reviewed today:  LHC 06/2017:  Prox Cx lesion, 30 %stenosed.  Prox RCA lesion, 30 %stenosed.  Mid RCA to Dist RCA lesion, 30 %stenosed.  There is moderate to severe left ventricular systolic dysfunction.  LV end diastolic pressure is moderately elevated.  There is trivial (1+) mitral regurgitation.   1. Mild  nonobstructive coronary artery disease. 2. Moderately to severely reduced LV systolic function with an EF of 30-35% likely due to hypertensive heart disease. 3. Moderately elevated left ventricular end-diastolic pressure. 4. Aortic root angiography showed no evidence of ascending aortic aneurysm or dissection.  Recommendations: I suspect that the patient's symptoms are likely due to acute systolic heart failure due to hypertensive heart disease with new left bundle branch block. There is no culprit for unstable angina. We need to start him on heart failure medications. I initiated treatment with carvedilol and Entresto (the patient tolerated losartan in the past). Gentle diuresis with oral furosemide. Smoking cessation is advised.  __________  2D echo 06/2017: - Procedure narrative: Transthoracic echocardiography. Image   quality was poor. The study was technically difficult, as a   result of poor acoustic windows and poor sound wave transmission.   Intravenous contrast (Definity) was administered. - Left ventricle: The cavity size was mildly dilated. There was   mild concentric hypertrophy. Systolic function was moderately   reduced. The estimated ejection fraction was in the range of 35%   to 40%. Diffuse hypokinesis. Features are consistent with a   pseudonormal left ventricular filling pattern, with concomitant   abnormal relaxation and increased filling pressure (grade 2   diastolic dysfunction). - Left atrium: The atrium was mildly dilated. __________  2D echo 02/2017: - Left ventricle: The cavity size was mildly dilated. Systolic   function was moderately to severely reduced. The estimated   ejection fraction was in the range of 30% to 35%. Hypokinesis of   the anterior myocardium. Hypokinesis of the anteroseptal   myocardium. Hypokinesis of the apical myocardium. - Mitral valve: There was mild regurgitation. - Left atrium: The atrium was mildly dilated. - Right ventricle:  Systolic function was normal. - Pulmonary arteries: Systolic pressure was mild to moderately   elevated PA peak pressure: 44 mm Hg (S).  Impressions:  - Unable to exclude apical thrombus. Severe apical hypokinesis, if   not akinesis. Consider imaging of apical region with definity if   clinically indicated. __________  Limited 2D echo 03/2018: - Procedure narrative: LIMITED TTE to assess for LV apical thrombus   and LVF. - Left ventricle: The cavity size was normal. Systolic function was   moderately reduced. The estimated ejection fraction was in the   range of 35% to 40%. Hypokinesis of the mid-apicalanteroseptal,   anterior, and apical  myocardium. - Left atrium: The atrium was mildly dilated.  Impressions:  - No LV thrombus.  EKG:  EKG is ordered today.  The EKG ordered today demonstrates NSR, 69 bpm, LBBB (known)  Recent Labs: 08/09/2019: ALT 30; Hemoglobin 15.1; Platelets 195 08/25/2019: BUN 10; Creatinine, Ser 0.93; Potassium 4.3; Sodium 137  Recent Lipid Panel    Component Value Date/Time   CHOL 171 08/09/2019 0933   TRIG 255 (H) 08/09/2019 0933   HDL 32 (L) 08/09/2019 0933   CHOLHDL 5.3 (H) 08/09/2019 0933   CHOLHDL 4.3 03/03/2018 0610   VLDL 33 03/03/2018 0610   LDLCALC 96 08/09/2019 0933    PHYSICAL EXAM:    VS:  BP (!) 148/100 (BP Location: Left Arm, Patient Position: Sitting, Cuff Size: Normal)    Pulse 69    Temp (!) 97.2 F (36.2 C)    Ht 5\' 10"  (1.778 m)    Wt 217 lb 12 oz (98.8 kg)    BMI 31.24 kg/m   BMI: Body mass index is 31.24 kg/m.  Physical Exam  Constitutional: He is oriented to person, place, and time. He appears well-developed and well-nourished.  HENT:  Head: Normocephalic and atraumatic.  Eyes: Right eye exhibits no discharge. Left eye exhibits no discharge.  Neck: Normal range of motion. No JVD present.  Cardiovascular: Normal rate, regular rhythm, S1 normal, S2 normal and normal heart sounds. Exam reveals no distant heart sounds, no  friction rub, no midsystolic click and no opening snap.  No murmur heard. Pulses:      Posterior tibial pulses are 2+ on the right side and 2+ on the left side.  Pulmonary/Chest: Effort normal and breath sounds normal. No respiratory distress. He has no decreased breath sounds. He has no wheezes. He has no rales. He exhibits no tenderness.  Abdominal: Soft. He exhibits no distension. There is no abdominal tenderness.  Musculoskeletal:        General: No edema.  Neurological: He is alert and oriented to person, place, and time.  Skin: Skin is warm and dry. No cyanosis. Nails show no clubbing.  Psychiatric: He has a normal mood and affect. His speech is normal and behavior is normal. Judgment and thought content normal.    Wt Readings from Last 3 Encounters:  09/05/19 217 lb 12 oz (98.8 kg)  08/25/19 215 lb 12 oz (97.9 kg)  08/09/19 222 lb 6.4 oz (100.9 kg)     ASSESSMENT & PLAN:   1. Chronic combined systolic and diastolic CHF/NICM: He appears euvolemic and well compensated.  He continues to feel well with medication compliance.  Recent renal function and potassium stable after resumption of medications.  Cost has previously been prohibitive to addition of Entresto.  Titrate Coreg to 25 mg twice daily.  Otherwise, he will continue current doses of losartan, spironolactone, and Lasix.  When he is seen in follow-up would recommend the addition of BiDil.  Would continue to see patient on a monthly basis while his medication regimen is optimized.  Following that, would recommend repeat echo to evaluate LV systolic function on maximal tolerated evidence-based heart failure therapy and in the setting of medication compliance.  If his EF remains less than 35% at that time would refer to EP for consideration of CRT-D.  CHF education discussed in detail.  2. HTN: Blood pressure is slightly improved though remains suboptimal.  Increase carvedilol to 25 mg twice daily.  Otherwise, he will continue current  doses of losartan, spironolactone, and Lasix.  Recent  BMP demonstrated stable renal function and potassium.  In follow-up, if blood pressure remains above goal would recommend addition of BiDil.  3. HLD: LDL of 96 from 07/2019, while off statin therapy.  He has since been restarted on atorvastatin 40 mg daily.  Recommend rechecking fasting lipid panel and liver function in approximately 4 weeks.  4. Sleep disordered breathing: Has been referred for sleep study.  5. Poorly controlled diabetes: Follow-up with PCP as directed.  6. Tobacco abuse: Complete cessation is recommended.  Disposition: F/u with Dr. Rockey Situ or an APP in 1 month.   Medication Adjustments/Labs and Tests Ordered: Current medicines are reviewed at length with the patient today.  Concerns regarding medicines are outlined above. Medication changes, Labs and Tests ordered today are summarized above and listed in the Patient Instructions accessible in Encounters.   Signed, Christell Faith, PA-C 09/05/2019 10:47 AM     [facility] [address] [place], [place] [address] [phone]

## 2019-09-05 ENCOUNTER — Encounter: Payer: Self-pay | Admitting: Physician Assistant

## 2019-09-05 ENCOUNTER — Ambulatory Visit (INDEPENDENT_AMBULATORY_CARE_PROVIDER_SITE_OTHER): Payer: Managed Care, Other (non HMO) | Admitting: Physician Assistant

## 2019-09-05 ENCOUNTER — Other Ambulatory Visit: Payer: Self-pay

## 2019-09-05 VITALS — BP 148/100 | HR 69 | Temp 97.2°F | Ht 70.0 in | Wt 217.8 lb

## 2019-09-05 DIAGNOSIS — E119 Type 2 diabetes mellitus without complications: Secondary | ICD-10-CM

## 2019-09-05 DIAGNOSIS — E1159 Type 2 diabetes mellitus with other circulatory complications: Secondary | ICD-10-CM | POA: Diagnosis not present

## 2019-09-05 DIAGNOSIS — I10 Essential (primary) hypertension: Secondary | ICD-10-CM

## 2019-09-05 DIAGNOSIS — I428 Other cardiomyopathies: Secondary | ICD-10-CM | POA: Diagnosis not present

## 2019-09-05 DIAGNOSIS — E782 Mixed hyperlipidemia: Secondary | ICD-10-CM

## 2019-09-05 DIAGNOSIS — G4730 Sleep apnea, unspecified: Secondary | ICD-10-CM

## 2019-09-05 DIAGNOSIS — I5042 Chronic combined systolic (congestive) and diastolic (congestive) heart failure: Secondary | ICD-10-CM

## 2019-09-05 DIAGNOSIS — I152 Hypertension secondary to endocrine disorders: Secondary | ICD-10-CM

## 2019-09-05 MED ORDER — CARVEDILOL 25 MG PO TABS: 25.0000 mg | ORAL_TABLET | Freq: Two times a day (BID) | ORAL | 3 refills | Status: DC

## 2019-09-05 NOTE — Patient Instructions (Signed)
Medication Instructions:  1- INCREASE Coreg Take 1 tablet (25 mg total) by mouth 2 (two) times daily *If you need a refill on your cardiac medications before your next appointment, please call your pharmacy*  Lab Work: None ordered  If you have labs (blood work) drawn today and your tests are completely normal, you will receive your results only by: [name] Kitchen MyChart Message (if you have MyChart) OR . A paper copy in the mail If you have any lab test that is abnormal or we need to change your treatment, we will call you to review the results.  Testing/Procedures: None ordered   Follow-Up: At [facility], you and your health needs are our priority.  As part of our continuing mission to provide you with exceptional heart care, we have created designated Provider Care Teams.  These Care Teams include your primary Cardiologist (physician) and Advanced Practice Providers (APPs -  Physician Assistants and Nurse Practitioners) who all work together to provide you with the care you need, when you need it.  Your next appointment:   1 month  The format for your next appointment:   In Person  Provider:    You may see Ida Rogue, MD or Christell Faith, PA-C.

## 2019-09-07 NOTE — Addendum Note (Signed)
Addended by: Verlon Au on: 09/07/2019 03:10 PM   Modules accepted: Orders

## 2019-09-25 NOTE — Progress Notes (Signed)
Cardiology Office Note  Date:  09/26/2019   ID:  Keith [name], DOB 01-01-1971, MRN [id number]  PCP:  Trinna Post, PA-C   Chief Complaint  Patient presents with  . Other    1 month follow up. [name] denies chest pain and SOB. Meds verbally with patient,.     HPI:  Keith [name] is a 48 yo malewith history of Smoker  poor controlled hypertension Presenting to the hospital 07/11/2017 with chest pain, shortness of breath Workup including cardiac catheterization showing nonobstructive coronary disease Ejection fraction 30-35% on echocardiogram 2018 Up to 35 to 40% 2020 Who presents for follow-up of his nonischemic hypertensivecardiomyopathy  Seen 1 month ago blood pressure was elevated Felt well at the time  Echo  - Left ventricle: The cavity size was normal. Systolic function was   moderately reduced. The estimated ejection fraction was in the   range of 35% to 40%. Hypokinesis of the mid-apicalanteroseptal,   anterior, and apical myocardium. - Left atrium: The atrium was mildly dilated.  Results discussed with him in detail Blood pressure running high today  Discussed previous attempts to get Entresto, . Reports it was $500.  Did not use a coupon Suspect this was the price before approval from insurance He is on Pharmacist, community  Working at [employer], no limitations in his activities  EKG personally reviewed by myself on todays visit Shows normal sinus rhythm rate 71 bpm left bundle branch block  Hospital records reviewed with him as below [facility] 07/11/2017: Conclusion     Prox Cx lesion, 30 %stenosed.  Prox RCA lesion, 30 %stenosed.  Mid RCA to Dist RCA lesion, 30 %stenosed.  There is moderate to severe left ventricular systolic dysfunction.  LV end diastolic pressure is moderately elevated.  There is trivial (1+) mitral regurgitation.  1. Mild nonobstructive coronary artery disease. 2. Moderately to severely reduced LV  systolic function with an EF of 30-35% likely due to hypertensive heart disease. 3. Moderately elevated left ventricular end-diastolic pressure. 4. Aortic root angiography showed no evidence of ascending aortic aneurysm or dissection.  Recommendations:  acute systolic heart failure due to hypertensive heart disease with new left bundle branch block.       TTE 07/11/2017: Study Conclusions  - Procedure narrative: Transthoracic echocardiography. Image quality was poor. The study was technically difficult, as a result of poor acoustic windows and poor sound wave transmission. Intravenous contrast (Definity) was administered. - Left ventricle: The cavity size was mildly dilated. There was mild concentric hypertrophy. Systolic function was moderately reduced. The estimated ejection fraction was in the range of 35% to 40%. Diffuse hypokinesis. Features are consistent with a pseudonormal left ventricular filling pattern, with concomitant abnormal relaxation and increased filling pressure (grade 2 diastolic dysfunction). - Left atrium: The atrium was mildly dilated.  PMH:   has a past medical history of Chronic combined systolic (congestive) and diastolic (congestive) heart failure ([place]), Diabetes mellitus, Erectile dysfunction, Essential hypertension, Frequent headaches, NICM (nonischemic cardiomyopathy) ([place]), Non-obstructive Coronary Artery Disease, TIA (transient ischemic attack), and Tobacco abuse.  PSH:    Past Surgical History:  Procedure Laterality Date  . COLONOSCOPY WITH PROPOFOL N/A 02/19/2017   Procedure: COLONOSCOPY WITH PROPOFOL;  Surgeon: Jonathon Bellows, MD;  Location: [facility] ENDOSCOPY;  Service: Endoscopy;  Laterality: N/A;  . ESOPHAGOGASTRODUODENOSCOPY (EGD) WITH PROPOFOL N/A 02/19/2017   Procedure: ESOPHAGOGASTRODUODENOSCOPY (EGD) WITH PROPOFOL;  Surgeon: Jonathon Bellows, MD;  Location: [facility] ENDOSCOPY;  Service: Endoscopy;  Laterality: N/A;  . LEFT HEART CATH AND  CORONARY ANGIOGRAPHY N/A 07/11/2017   Procedure: LEFT HEART CATH AND CORONARY ANGIOGRAPHY;  Surgeon: [name], MD;  Location: [facility] INVASIVE CV LAB;  Service: Cardiovascular;  Laterality: N/A;    Current Outpatient Medications  Medication Sig Dispense Refill  . aspirin 81 MG chewable tablet Chew 1 tablet (81 mg total) by mouth daily. 30 tablet 2  . atorvastatin (LIPITOR) 40 MG tablet TAKE 1 TABLET BY MOUTH ONCE DAILY AT 6PM 90 tablet 0  . carvedilol (COREG) 25 MG tablet Take 1 tablet (25 mg total) by mouth 2 (two) times daily. 180 tablet 3  . furosemide (LASIX) 40 MG tablet Take 1 tablet (40 mg total) by mouth daily. 90 tablet 3  . losartan (COZAAR) 100 MG tablet Take 1 tablet (100 mg total) by mouth daily. 90 tablet 0  . metFORMIN (GLUCOPHAGE) 500 MG tablet 500mg  daily x 1 wk. 500 mg twice daily x 1 wk. 1000 mg in the morning and 500 mg at night x 1 wk. 1000 mg twice daily x 1 wk. 180 tablet 3  . spironolactone (ALDACTONE) 25 MG tablet Take 1 tablet (25 mg total) by mouth daily. 90 tablet 0   No current facility-administered medications for this visit.      Allergies:   Other   Social History:  The patient  reports that he has been smoking cigarettes. He has been smoking about 1.00 pack per day. He has never used smokeless tobacco. He reports current alcohol use. He reports that he does not use drugs.   Family History:   family history includes Diabetes in his father and mother; Hypertension in his father and mother.    Review of Systems: Review of Systems  Constitutional: Negative.   HENT: Negative.   Respiratory: Negative.   Cardiovascular: Negative.   Gastrointestinal: Negative.   Musculoskeletal: Negative.   Neurological: Negative.   Psychiatric/Behavioral: Negative.   All other systems reviewed and are negative.   PHYSICAL EXAM: VS:  BP (!) 170/100 (BP Location: Left Arm, Patient Position: Sitting, Cuff Size: Normal)   Pulse 71   Wt 222 lb (100.7 kg)   BMI 31.85  kg/m  , BMI Body mass index is 31.85 kg/m. GEN: Well nourished, well developed, in no acute distress  HEENT: normal  Neck: no JVD, carotid bruits, or masses Cardiac: RRR; no murmurs, rubs, or gallops,no edema  Respiratory:  clear to auscultation bilaterally, normal work of breathing GI: soft, nontender, nondistended, + BS MS: no deformity or atrophy  Skin: warm and dry, no rash Neuro:  Strength and sensation are intact Psych: euthymic mood, full affect   Recent Labs: 08/09/2019: ALT 30; Hemoglobin 15.1; Platelets 195 08/25/2019: BUN 10; Creatinine, Ser 0.93; Potassium 4.3; Sodium 137    Lipid Panel Lab Results  Component Value Date   CHOL 171 08/09/2019   HDL 32 (L) 08/09/2019   LDLCALC 96 08/09/2019   TRIG 255 (H) 08/09/2019      Wt Readings from Last 3 Encounters:  09/26/19 222 lb (100.7 kg)  09/05/19 217 lb 12 oz (98.8 kg)  08/25/19 215 lb 12 oz (97.9 kg)       ASSESSMENT AND PLAN:  Dilated cardiomyopathy (HCC) - Plan: EKG 12-Lead,  felt secondary to hypertensive heart disease Blood pressure still elevated, weight trending upwards Recommend we retry getting him the Entresto high-dose 97/103 with coupon  would stop losartan once starting on Entresto Suggested he call us with blood pressure measurements in the next several weeks  Acute systolic heart  failure (HCC) Appears relatively euvolemic, does feel better after taking Lasix Suggest he might need extra Lasix as needed for abdominal bloating weight gain leg swelling  Hypertensive urgency Initial blood pressure 170 systolic down to 993 on repeat We will start Entresto as above  Coronary artery disease of native artery of native heart with stable angina pectoris (HCC) Currently with no symptoms of angina. No further workup at this time. Continue current medication regimen. Recommended need for smoking cessation  DM (diabetes mellitus) with complications (HCC) We have encouraged continued exercise, careful  diet management in an effort to lose weight. Weight continues to trend upwards  Disposition:   F/U  3 months    Total encounter time more than 25 minutes  Greater than 50% was spent in counseling and coordination of care with the patient    No orders of the defined types were placed in this encounter.    Signed, Dossie Arbour, M.D., Ph.D. 09/26/2019  [facility] [place], [place] [phone]

## 2019-09-26 ENCOUNTER — Encounter: Payer: Self-pay | Admitting: Cardiovascular Disease

## 2019-09-26 ENCOUNTER — Ambulatory Visit (INDEPENDENT_AMBULATORY_CARE_PROVIDER_SITE_OTHER): Payer: Managed Care, Other (non HMO) | Admitting: Cardiovascular Disease

## 2019-09-26 ENCOUNTER — Other Ambulatory Visit: Payer: Self-pay

## 2019-09-26 VITALS — BP 150/98 | HR 71 | Wt 222.0 lb

## 2019-09-26 DIAGNOSIS — E782 Mixed hyperlipidemia: Secondary | ICD-10-CM | POA: Diagnosis not present

## 2019-09-26 DIAGNOSIS — E1159 Type 2 diabetes mellitus with other circulatory complications: Secondary | ICD-10-CM

## 2019-09-26 DIAGNOSIS — I428 Other cardiomyopathies: Secondary | ICD-10-CM

## 2019-09-26 DIAGNOSIS — I5042 Chronic combined systolic (congestive) and diastolic (congestive) heart failure: Secondary | ICD-10-CM | POA: Diagnosis not present

## 2019-09-26 DIAGNOSIS — E119 Type 2 diabetes mellitus without complications: Secondary | ICD-10-CM | POA: Diagnosis not present

## 2019-09-26 DIAGNOSIS — I513 Intracardiac thrombosis, not elsewhere classified: Secondary | ICD-10-CM

## 2019-09-26 DIAGNOSIS — I152 Hypertension secondary to endocrine disorders: Secondary | ICD-10-CM

## 2019-09-26 DIAGNOSIS — I10 Essential (primary) hypertension: Secondary | ICD-10-CM

## 2019-09-26 MED ORDER — ENTRESTO 97-103 MG PO TABS: 1.0000 | ORAL_TABLET | Freq: Two times a day (BID) | ORAL | 2 refills | Status: DC

## 2019-09-26 NOTE — Patient Instructions (Addendum)
Medication Instructions:  Please hold the losartan Start entresto 97/103 mg twice a day  Needs coupon - Free 30 day coupon codes entered on Rx Please call [facility] for more guidance on co-pay program.   Please call or MyChart message with BP/HR numbers. Please take BP/Heart rate about 2 hours after your BP medication, around the same time each day.  If you need a refill on your cardiac medications before your next appointment, please call your pharmacy.    Lab work: No new labs needed   If you have labs (blood work) drawn today and your tests are completely normal, you will receive your results only by: [name] Kitchen MyChart Message (if you have MyChart) OR . A paper copy in the mail If you have any lab test that is abnormal or we need to change your treatment, we will call you to review the results.   Testing/Procedures: No new testing needed   Follow-Up: At [facility], you and your health needs are our priority.  As part of our continuing mission to provide you with exceptional heart care, we have created designated Provider Care Teams.  These Care Teams include your primary Cardiologist (physician) and Advanced Practice Providers (APPs -  Physician Assistants and Nurse Practitioners) who all work together to provide you with the care you need, when you need it.  . You will need a follow up appointment in 3 months   . Providers on your designated Care Team:   . Murray Hodgkins, NP . Christell Faith, PA-C . Marrianne Mood, PA-C  Any Other Special Instructions Will Be Listed Below (If Applicable).  For educational health videos Log in to : [email or web address] Or : [email or web address], password : [email or web address]

## 2019-10-27 ENCOUNTER — Other Ambulatory Visit: Payer: Self-pay | Admitting: Physician Assistant

## 2019-10-27 DIAGNOSIS — E118 Type 2 diabetes mellitus with unspecified complications: Secondary | ICD-10-CM

## 2019-10-27 MED ORDER — METFORMIN HCL 500 MG PO TABS: ORAL_TABLET | ORAL | 3 refills | Status: DC

## 2019-10-27 NOTE — Telephone Encounter (Signed)
[facility] faxed refill request for the following medications:  metFORMIN (GLUCOPHAGE) 500 MG tablet   Please advise.  Thanks, TGH  

## 2019-11-11 ENCOUNTER — Encounter: Payer: Self-pay | Admitting: Physician Assistant

## 2019-12-02 ENCOUNTER — Other Ambulatory Visit: Payer: Self-pay | Admitting: Physician Assistant

## 2019-12-02 DIAGNOSIS — I152 Hypertension secondary to endocrine disorders: Secondary | ICD-10-CM

## 2019-12-02 DIAGNOSIS — E1159 Type 2 diabetes mellitus with other circulatory complications: Secondary | ICD-10-CM

## 2019-12-02 DIAGNOSIS — I5021 Acute systolic (congestive) heart failure: Secondary | ICD-10-CM

## 2019-12-30 NOTE — Progress Notes (Deleted)
Cardiology Office Note  Date:  12/30/2019   ID:  Keith [name], DOB 02-21-1971, MRN [id number]  PCP:  Trinna Post, PA-C   No chief complaint on file.   HPI:  [name] is a 49 yo malewith history of Smoker  poor controlled hypertension Presenting to the hospital 07/11/2017 with chest pain, shortness of breath Workup including cardiac catheterization showing nonobstructive coronary disease Ejection fraction 30-35% on echocardiogram 2018 Up to 35 to 40% 2020 Who presents for follow-up of his nonischemic hypertensivecardiomyopathy  Seen 1 month ago blood pressure was elevated Felt well at the time  Echo  - Left ventricle: The cavity size was normal. Systolic function was   moderately reduced. The estimated ejection fraction was in the   range of 35% to 40%. Hypokinesis of the mid-apicalanteroseptal,   anterior, and apical myocardium. - Left atrium: The atrium was mildly dilated.  Results discussed with him in detail Blood pressure running high today  Discussed previous attempts to get Entresto, . Reports it was $500.  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 07/11/2017: Conclusion     Prox Cx lesion, 30 %stenosed.  Prox RCA lesion, 30 %stenosed.  Mid RCA to Dist RCA lesion, 30 %stenosed.  There is moderate to severe left ventricular systolic dysfunction.  LV end diastolic pressure is moderately elevated.  There is trivial (1+) mitral regurgitation.  1. Mild nonobstructive coronary artery disease. 2. Moderately to severely reduced LV systolic function with an EF of 30-35% likely due to hypertensive heart disease. 3. Moderately elevated left  ventricular end-diastolic pressure. 4. Aortic root angiography showed no evidence of ascending aortic aneurysm or dissection.  Recommendations:  acute systolic heart failure due to hypertensive heart disease with new left bundle branch block.       TTE 07/11/2017: Study Conclusions  - Procedure narrative: Transthoracic echocardiography. Image quality was poor. The study was technically difficult, as a result of poor acoustic windows and poor sound wave transmission. Intravenous contrast (Definity) was administered. - Left ventricle: The cavity size was mildly dilated. There was mild concentric hypertrophy. Systolic function was moderately reduced. The estimated ejection fraction was in the range of 35% to 40%. Diffuse hypokinesis. Features are consistent with a pseudonormal left ventricular filling pattern, with concomitant abnormal relaxation and increased filling pressure (grade 2 diastolic dysfunction). - Left atrium: The atrium was mildly dilated.  PMH:   has a past medical history of Chronic combined systolic (congestive) and diastolic (congestive) heart failure ([place]), Diabetes mellitus, Erectile dysfunction, Essential hypertension, Frequent headaches, NICM (nonischemic cardiomyopathy) ([place]), Non-obstructive Coronary Artery Disease, TIA (transient ischemic attack), and Tobacco abuse.  PSH:    Past Surgical History:  Procedure Laterality Date  . COLONOSCOPY WITH PROPOFOL N/A 02/19/2017   Procedure: COLONOSCOPY WITH PROPOFOL;  Surgeon: Jonathon Bellows, MD;  Location: [facility] ENDOSCOPY;  Service: Endoscopy;  Laterality: N/A;  . ESOPHAGOGASTRODUODENOSCOPY (EGD) WITH PROPOFOL N/A 02/19/2017   Procedure: ESOPHAGOGASTRODUODENOSCOPY (EGD) WITH PROPOFOL;  Surgeon: Jonathon Bellows, MD;  Location: [facility] ENDOSCOPY;  Service: Endoscopy;  Laterality: N/A;  . LEFT HEART CATH AND CORONARY ANGIOGRAPHY N/A 07/11/2017   Procedure: LEFT HEART CATH AND CORONARY ANGIOGRAPHY;  Surgeon: Wellington Hampshire, MD;  Location: [facility]  INVASIVE CV LAB;  Service: Cardiovascular;  Laterality: N/A;    Current Outpatient Medications  Medication Sig Dispense Refill  . aspirin 81 MG chewable tablet Chew 1 tablet (81 mg total) by mouth daily. 30 tablet 2  . atorvastatin (LIPITOR) 40 MG tablet TAKE 1 TABLET BY MOUTH ONCE DAILY AT  6PM 90 tablet 0  . carvedilol (COREG) 25 MG tablet Take 1 tablet (25 mg total) by mouth 2 (two) times daily. 180 tablet 3  . furosemide (LASIX) 40 MG tablet Take 1 tablet (40 mg total) by mouth daily. 90 tablet 3  . metFORMIN (GLUCOPHAGE) 500 MG tablet 500mg  daily x 1 wk. 500 mg twice daily x 1 wk. 1000 mg in the morning and 500 mg at night x 1 wk. 1000 mg twice daily x 1 wk. 180 tablet 3  . sacubitril-valsartan (ENTRESTO) 97-103 MG Take 1 tablet by mouth 2 (two) times daily. 180 tablet 2  . spironolactone (ALDACTONE) 25 MG tablet Take 1 tablet by mouth once daily 90 tablet 0   No current facility-administered medications for this visit.     Allergies:   Other   Social History:  The patient  reports that he has been smoking cigarettes. He has been smoking about 1.00 pack per day. He has never used smokeless tobacco. He reports current alcohol use. He reports that he does not use drugs.   Family History:   family history includes Diabetes in his father and mother; Hypertension in his father and mother.    Review of Systems: Review of Systems  Constitutional: Negative.   HENT: Negative.   Respiratory: Negative.   Cardiovascular: Negative.   Gastrointestinal: Negative.   Musculoskeletal: Negative.   Neurological: Negative.   Psychiatric/Behavioral: Negative.   All other systems reviewed and are negative.   PHYSICAL EXAM: VS:  There were no vitals taken for this visit. , BMI There is no height or weight on file to calculate BMI. GEN: Well nourished, well developed, in no acute distress  HEENT: normal  Neck: no JVD, carotid bruits, or masses Cardiac: RRR; no  murmurs, rubs, or gallops,no edema  Respiratory:  clear to auscultation bilaterally, normal work of breathing GI: soft, nontender, nondistended, + BS MS: no deformity or atrophy  Skin: warm and dry, no rash Neuro:  Strength and sensation are intact Psych: euthymic mood, full affect   Recent Labs: 08/09/2019: ALT 30; Hemoglobin 15.1; Platelets 195 08/25/2019: BUN 10; Creatinine, Ser 0.93; Potassium 4.3; Sodium 137    Lipid Panel Lab Results  Component Value Date   CHOL 171 08/09/2019   HDL 32 (L) 08/09/2019   LDLCALC 96 08/09/2019   TRIG 255 (H) 08/09/2019      Wt Readings from Last 3 Encounters:  09/26/19 222 lb (100.7 kg)  09/05/19 217 lb 12 oz (98.8 kg)  08/25/19 215 lb 12 oz (97.9 kg)       ASSESSMENT AND PLAN:  Dilated cardiomyopathy (HCC) - Plan: EKG 12-Lead,  felt secondary to hypertensive heart disease Blood pressure still elevated, weight trending upwards Recommend we retry getting him the Entresto high-dose 97/103 with coupon  would stop losartan once starting on Entresto Suggested he call 10/25/19 with blood pressure measurements in the next several weeks  Acute systolic heart failure (HCC) Appears relatively euvolemic, does feel better after taking Lasix Suggest he might need extra Lasix as needed for abdominal bloating weight gain leg swelling  Hypertensive urgency Initial blood pressure 170 systolic down to [place] on repeat We will start  Entresto as above  Coronary artery disease of native artery of native heart with stable angina pectoris (HCC) Currently with no symptoms of angina. No further workup at this time. Continue current medication regimen. Recommended need for smoking cessation  DM (diabetes mellitus) with complications (HCC) We have encouraged continued exercise, careful diet management in an effort to lose weight. Weight continues to trend upwards  Disposition:   F/U  3 months    Total encounter time more than 25 minutes  Greater than 50% was  spent in counseling and coordination of care with the patient    No orders of the defined types were placed in this encounter.    Signed, Dossie Arbour, M.D., Ph.D. 12/30/2019  [facility] [place], [place] [phone]

## 2020-01-01 ENCOUNTER — Ambulatory Visit: Payer: Managed Care, Other (non HMO) | Admitting: Cardiovascular Disease

## 2020-01-09 ENCOUNTER — Ambulatory Visit: Payer: Managed Care, Other (non HMO) | Admitting: Cardiovascular Disease

## 2020-01-11 NOTE — Progress Notes (Deleted)
Office Visit    Patient Name: Keith [name] Date of Encounter: 01/11/2020  Primary Care Provider:  Trinna Post, PA-C Primary Cardiologist:  Ida Rogue, MD Electrophysiologist:  None   Chief Complaint    Keith [name] is a 49 y.o. 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   Past Medical History    Past Medical History:  Diagnosis Date  . Chronic combined systolic (congestive) and diastolic (congestive) heart failure ([place])    a. 06/2017 Echo: EF 35-40%, Gr1 DD; b. 02/2018 Echo: Ef 30-35%; c. 03/2018 Echo: EF 35-40%, mid-apicalanteroseptal, ant, and apical HK. Mildly dil LA. No LV thrombus.  . Diabetes mellitus   . Erectile dysfunction   . Essential hypertension   . Frequent headaches   . NICM (nonischemic cardiomyopathy) ([place])    a. 06/2017 Echo: EF 35-40%, Gr1 DD; b. 06/2017 Cath: nonobs dzs; c. 02/2018 Echo: Ef 30-35%; d. 03/2018 Echo: EF 35-40%, mid-apicalanteroseptal, ant, and apical HK. Mildly dil LA. No LV thrombus.  . Non-obstructive Coronary Artery Disease    a. 06/2017 Cath: LM nl, LAD nl, D1/2/3 nl, LCX 30p, OM1 nl, OM2 min irregs, OM3/4 nl, RCA 30p/m, RPL2/3 nl.  . TIA (transient ischemic attack)    a. 02/2018 MRA w/o acute findings; b. 02/2018 Carotis U/S: no hemodynamically significant stenoses.  . Tobacco abuse    Past Surgical History:  Procedure Laterality Date  . COLONOSCOPY WITH PROPOFOL N/A 02/19/2017   Procedure: COLONOSCOPY WITH PROPOFOL;  Surgeon: Jonathon Bellows, MD;  Location: [facility] ENDOSCOPY;  Service: Endoscopy;  Laterality: N/A;  . ESOPHAGOGASTRODUODENOSCOPY (EGD) WITH PROPOFOL N/A 02/19/2017   Procedure: ESOPHAGOGASTRODUODENOSCOPY (EGD) WITH PROPOFOL;  Surgeon: Jonathon Bellows, MD;  Location: [facility] ENDOSCOPY;  Service: Endoscopy;  Laterality: N/A;  . LEFT HEART CATH AND CORONARY  ANGIOGRAPHY N/A 07/11/2017   Procedure: LEFT HEART CATH AND CORONARY ANGIOGRAPHY;  Surgeon: Wellington Hampshire, MD;  Location: [facility] CV LAB;  Service: Cardiovascular;  Laterality: N/A;    Allergies  Allergies  Allergen Reactions  . Other Other (See Comments)    Muscle Relaxers (caused psychotic break)    History of Present Illness    Keith [name] is a 49 y.o. 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 He was last seen 09/26/19 by Dr. Rockey Situ.   Admitted to the hospital 06/2017 with chest pain, dyspnea, hypertensive urgency. Cardiac cath with nonobstructive CAD (prox Cx 30%, prox RCA 30%, mid-distal RCA 30%, moderately elevated LV end diastolic pressure, trivial (1+) MR. Echo with LVEF 35-40%. Subsequently lost to follow up and readmitted 02/2018 with slurred speech and TIA. Carotic duplex 02/2018 with no significant stenosis. Echo 02/2018 with LVEF 30-35%, LV mildly dilated, mild MR, LA mildly dilated, pulmonary artery systolic pressure mild-moderately elevated at 35mmHg. As LV thrombus could not be ruled out, repeat echo performed. Echo 03/2018 with LVEF 35-40%, LA mildly dilated, no thrombus.  Works at a [employer].   ***  EKGs/Labs/Other Studies Reviewed:   The following studies were reviewed today: *** Echo 03/2018 - Procedure narrative: LIMITED TTE to assess for LV apical thrombus    and LVF.  - Left ventricle: The cavity size was normal. Systolic function was    moderately reduced. The estimated ejection fraction was in the  range of 35% to 40%. Hypokinesis of the mid-apicalanteroseptal,    anterior, and apical myocardium.  - Left atrium: The atrium was mildly dilated.   Impressions:   - No LV thrombus.   Echo 02/2018 Left ventricle: The cavity size was mildly dilated. Systolic    function was moderately to severely reduced. The estimated    ejection fraction was  in the range of 30% to 35%. Hypokinesis of    the anterior myocardium. Hypokinesis of the anteroseptal    myocardium. Hypokinesis of the apical myocardium.  - Mitral valve: There was mild regurgitation.  - Left atrium: The atrium was mildly dilated.  - Right ventricle: Systolic function was normal.  - Pulmonary arteries: Systolic pressure was mild to moderately    elevated PA peak pressure: 44 mm Hg (S).   Impressions:   - Unable to exclude apical thrombus. Severe apical hypokinesis, if    not akinesis. Consider imaging of apical region with definity if    clinically indicated.   Cardiac cath 06/2017  Prox Cx lesion, 30 %stenosed.  Prox RCA lesion, 30 %stenosed.  Mid RCA to Dist RCA lesion, 30 %stenosed.  There is moderate to severe left ventricular systolic dysfunction.  LV end diastolic pressure is moderately elevated.  There is trivial (1+) mitral regurgitation.   1. Mild nonobstructive coronary artery disease. 2. Moderately to severely reduced LV systolic function with an EF of 30-35% likely due to hypertensive heart disease. 3. Moderately elevated left ventricular end-diastolic pressure. 4. Aortic root angiography showed no evidence of ascending aortic aneurysm or dissection.   Recommendations: I suspect that the patient's symptoms are likely due to acute systolic heart failure due to hypertensive heart disease with new left bundle branch block. There is no culprit for unstable angina. We need to start him on heart failure medications. I initiated treatment with carvedilol and Entresto (the patient tolerated losartan in the past). Gentle diuresis with oral furosemide. Smoking cessation is advised.    EKG:  EKG is ordered today.  The ekg ordered today demonstrates ***  Recent Labs: 08/09/2019: ALT 30; Hemoglobin 15.1; Platelets 195 08/25/2019: BUN 10; Creatinine, Ser 0.93; Potassium 4.3; Sodium 137  Recent Lipid Panel    Component Value Date/Time   CHOL 171 08/09/2019  0933   TRIG 255 (H) 08/09/2019 0933   HDL 32 (L) 08/09/2019 0933   CHOLHDL 5.3 (H) 08/09/2019 0933   CHOLHDL 4.3 03/03/2018 0610   VLDL 33 03/03/2018 0610   LDLCALC 96 08/09/2019 0933    Home Medications   No outpatient medications have been marked as taking for the 01/12/20 encounter (Appointment) with Alver Sorrow, NP.      Review of Systems    ***   ROS All other systems reviewed and are otherwise negative except as noted above.  Physical Exam    VS:  There were no vitals taken for this visit. , BMI There is no height or weight on file to calculate BMI. GEN: Well nourished, well developed, in no acute distress. HEENT: normal. Neck: Supple, no JVD, carotid bruits, or masses. Cardiac: ***RRR, no murmurs, rubs, or gallops. No clubbing, cyanosis, edema.  ***Radials/DP/PT 2+ and equal bilaterally.  Respiratory:  ***Respirations regular and unlabored, clear to auscultation bilaterally. GI: Soft, nontender, nondistended, BS + x 4. MS: No deformity or atrophy. Skin: Warm and dry, no rash. Neuro:  Strength and sensation are intact. Psych: Normal affect.  Accessory Clinical Findings    ECG personally reviewed by me today - *** -  no acute changes.  Assessment & Plan    1. Chronic combined systolic and diastolich CHF/NICM -  2. Hypertensive heart disease -  3. HLD - 08/09/19 LDL 96. Statin since resumed. Lipid panel today.   4. Nonobstructive CAD - LHC 06/2017 mild nonobstructive disease. Continue GDMT aspirin, beta blocker, statin.   5. Sleep disordered breathing -   6. DM2 - 08/16/19 A1c of 12.2  7. Tobacco use - Smoking cessation encouraged. Recommend utilization of [id number].  Disposition: Follow up {follow up:15908} with Dr.Gollan or APP   Alver Sorrow, NP 01/11/2020, 9:53 AM

## 2020-01-12 ENCOUNTER — Ambulatory Visit: Payer: Managed Care, Other (non HMO) | Admitting: Family

## 2020-01-30 NOTE — Progress Notes (Deleted)
Office Visit    Patient Name: Keith [name] Date of Encounter: 01/30/2020  Primary Care Provider:  [name], PA-C Primary Cardiologist:  Keith Nordmann, MD Electrophysiologist:  None   Chief Complaint    Keith [name] is a 49 y.o. 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   Past Medical History    Past Medical History:  Diagnosis Date  . Chronic combined systolic (congestive) and diastolic (congestive) heart failure (HCC)    a. 06/2017 Echo: EF 35-40%, Gr1 DD; b. 02/2018 Echo: Ef 30-35%; c. 03/2018 Echo: EF 35-40%, mid-apicalanteroseptal, ant, and apical HK. Mildly dil LA. No LV thrombus.  . Diabetes mellitus   . Erectile dysfunction   . Essential hypertension   . Frequent headaches   . NICM (nonischemic cardiomyopathy) (HCC)    a. 06/2017 Echo: EF 35-40%, Gr1 DD; b. 06/2017 Cath: nonobs dzs; c. 02/2018 Echo: Ef 30-35%; d. 03/2018 Echo: EF 35-40%, mid-apicalanteroseptal, ant, and apical HK. Mildly dil LA. No LV thrombus.  . Non-obstructive Coronary Artery Disease    a. 06/2017 Cath: LM nl, LAD nl, D1/2/3 nl, LCX 30p, OM1 nl, OM2 min irregs, OM3/4 nl, RCA 30p/m, RPL2/3 nl.  . TIA (transient ischemic attack)    a. 02/2018 MRA w/o acute findings; b. 02/2018 Carotis U/S: no hemodynamically significant stenoses.  . Tobacco abuse    Past Surgical History:  Procedure Laterality Date  . COLONOSCOPY WITH PROPOFOL N/A 02/19/2017   Procedure: COLONOSCOPY WITH PROPOFOL;  Surgeon: Keith Mood, MD;  Location: [facility] ENDOSCOPY;  Service: Endoscopy;  Laterality: N/A;  . ESOPHAGOGASTRODUODENOSCOPY (EGD) WITH PROPOFOL N/A 02/19/2017   Procedure: ESOPHAGOGASTRODUODENOSCOPY (EGD) WITH PROPOFOL;  Surgeon: Keith Mood, MD;  Location: [facility] ENDOSCOPY;  Service: Endoscopy;  Laterality: N/A;  . LEFT HEART CATH AND CORONARY  ANGIOGRAPHY N/A 07/11/2017   Procedure: LEFT HEART CATH AND CORONARY ANGIOGRAPHY;  Surgeon: Keith Ouch, MD;  Location: [facility] INVASIVE CV LAB;  Service: Cardiovascular;  Laterality: N/A;    Allergies  Allergies  Allergen Reactions  . Other Other (See Comments)    Muscle Relaxers (caused psychotic break)    History of Present Illness    Keith [name] is a 49 y.o. 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 He was last seen 09/26/19 by Dr. Mariah [name].   Admitted to the hospital 06/2017 with chest pain, dyspnea, hypertensive urgency. Cardiac cath with nonobstructive CAD (prox Cx 30%, prox RCA 30%, mid-distal RCA 30%, moderately elevated LV end diastolic pressure, trivial (1+) MR. Echo with LVEF 35-40%. Subsequently lost to follow up and readmitted 02/2018 with slurred speech and TIA. Carotic duplex 02/2018 with no significant stenosis. Echo 02/2018 with LVEF 30-35%, LV mildly dilated, mild MR, LA mildly dilated, pulmonary artery systolic pressure mild-moderately elevated at . As LV thrombus could not be ruled out, repeat echo performed. Echo 03/2018 with LVEF 35-40%, LA mildly dilated, no thrombus.  Works at a [employer].   ***  EKGs/Labs/Other Studies Reviewed:   The following studies were reviewed today: *** Echo 03/2018 - Procedure narrative: LIMITED TTE to assess for LV apical thrombus    and LVF.  - Left ventricle: The cavity size was normal. Systolic function was    moderately reduced. The estimated ejection fraction was in the  range of 35% to 40%. Hypokinesis of the mid-apicalanteroseptal,    anterior, and apical myocardium.  - Left atrium: The atrium was mildly dilated.   Impressions:   - No LV thrombus.   Echo 02/2018 Left ventricle: The cavity size was mildly dilated. Systolic    function was moderately to severely reduced. The estimated    ejection fraction was  in the range of 30% to 35%. Hypokinesis of    the anterior myocardium. Hypokinesis of the anteroseptal    myocardium. Hypokinesis of the apical myocardium.  - Mitral valve: There was mild regurgitation.  - Left atrium: The atrium was mildly dilated.  - Right ventricle: Systolic function was normal.  - Pulmonary arteries: Systolic pressure was mild to moderately    elevated PA peak pressure: 44 mm Hg (S).   Impressions:   - Unable to exclude apical thrombus. Severe apical hypokinesis, if    not akinesis. Consider imaging of apical region with definity if    clinically indicated.   Cardiac cath 06/2017  Prox Cx lesion, 30 %stenosed.  Prox RCA lesion, 30 %stenosed.  Mid RCA to Dist RCA lesion, 30 %stenosed.  There is moderate to severe left ventricular systolic dysfunction.  LV end diastolic pressure is moderately elevated.  There is trivial (1+) mitral regurgitation.   1. Mild nonobstructive coronary artery disease. 2. Moderately to severely reduced LV systolic function with an EF of 30-35% likely due to hypertensive heart disease. 3. Moderately elevated left ventricular end-diastolic pressure. 4. Aortic root angiography showed no evidence of ascending aortic aneurysm or dissection.   Recommendations: I suspect that the patient's symptoms are likely due to acute systolic heart failure due to hypertensive heart disease with new left bundle branch block. There is no culprit for unstable angina. We need to start him on heart failure medications. I initiated treatment with carvedilol and Entresto (the patient tolerated losartan in the past). Gentle diuresis with oral furosemide. Smoking cessation is advised.    EKG:  EKG is ordered today.  The ekg ordered today demonstrates ***  Recent Labs: 08/09/2019: ALT 30; Hemoglobin 15.1; Platelets 195 08/25/2019: BUN 10; Creatinine, Ser 0.93; Potassium 4.3; Sodium 137  Recent Lipid Panel    Component Value Date/Time   CHOL 171 08/09/2019  0933   TRIG 255 (H) 08/09/2019 0933   HDL 32 (L) 08/09/2019 0933   CHOLHDL 5.3 (H) 08/09/2019 0933   CHOLHDL 4.3 03/03/2018 0610   VLDL 33 03/03/2018 0610   LDLCALC 96 08/09/2019 0933    Home Medications   No outpatient medications have been marked as taking for the 02/02/20 encounter (Appointment) with Alver Sorrow, NP.      Review of Systems    ***   ROS All other systems reviewed and are otherwise negative except as noted above.  Physical Exam    VS:  There were no vitals taken for this visit. , BMI There is no height or weight on file to calculate BMI. GEN: Well nourished, well developed, in no acute distress. HEENT: normal. Neck: Supple, no JVD, carotid bruits, or masses. Cardiac: ***RRR, no murmurs, rubs, or gallops. No clubbing, cyanosis, edema.  ***Radials/DP/PT 2+ and equal bilaterally.  Respiratory:  ***Respirations regular and unlabored, clear to auscultation bilaterally. GI: Soft, nontender, nondistended, BS + x 4. MS: No deformity or atrophy. Skin: Warm and dry, no rash. Neuro:  Strength and sensation are intact. Psych: Normal affect.  Accessory Clinical Findings    ECG personally reviewed by me today - *** -  no acute changes.  Assessment & Plan    1. Chronic combined systolic and diastolich CHF/NICM -   2. Hypertensive heart disease -   3. HLD - 08/09/19 LDL 96. Statin since resumed. Lipid panel today.   4. Nonobstructive CAD - LHC 06/2017 mild nonobstructive disease. Continue GDMT aspirin, beta blocker, statin.   5. Sleep disordered breathing -   6. DM2 - 08/16/19 A1c of 12.2  7. Tobacco use - Smoking cessation encouraged. Recommend utilization of [id number].  Disposition: Follow up {follow up:15908} with Dr.Gollan or APP   Loel Dubonnet, NP 01/30/2020, 1:43 PM

## 2020-02-02 ENCOUNTER — Ambulatory Visit: Payer: Managed Care, Other (non HMO) | Admitting: Family

## 2020-02-29 ENCOUNTER — Ambulatory Visit: Payer: Managed Care, Other (non HMO) | Attending: Internal Medicine

## 2020-02-29 DIAGNOSIS — Z23 Encounter for immunization: Secondary | ICD-10-CM

## 2020-02-29 NOTE — Progress Notes (Signed)
   Covid-19 Vaccination Clinic  Name:  Keith [name]    MRN: [id number] DOB: 1970/11/19  02/29/2020  Keith [name] was observed post Covid-19 immunization for 15 minutes without incident. He was provided with Vaccine Information Sheet and instruction to access the V-Safe system.   Keith [name] was instructed to call 911 with any severe reactions post vaccine: [name] Kitchen Difficulty breathing  . Swelling of face and throat  . A fast heartbeat  . A bad rash all over body  . Dizziness and weakness   Immunizations Administered    Name Date Dose VIS Date Route   Pfizer COVID-19 Vaccine 02/29/2020  8:44 AM 0.3 mL 10/27/2019 Intramuscular   Manufacturer: [employer], [employer]   Lot: [id number]   NDC: [address]-5

## 2020-03-05 ENCOUNTER — Other Ambulatory Visit: Payer: Managed Care, Other (non HMO)

## 2020-03-26 ENCOUNTER — Ambulatory Visit: Payer: Managed Care, Other (non HMO) | Attending: Internal Medicine

## 2020-03-26 DIAGNOSIS — Z23 Encounter for immunization: Secondary | ICD-10-CM

## 2020-03-26 NOTE — Progress Notes (Signed)
   Covid-19 Vaccination Clinic  Name:  Keith [name]    MRN: [id number] DOB: [birth year]  03/26/2020  [name] was observed post Covid-19 immunization for 15 minutes without incident. He was provided with Vaccine Information Sheet and instruction to access the V-Safe system.   [name] was instructed to call 911 with any severe reactions post vaccine: [name] Kitchen Difficulty breathing  . Swelling of face and throat  . A fast heartbeat  . A bad rash all over body  . Dizziness and weakness   Immunizations Administered    Name Date Dose VIS Date Route   Pfizer COVID-19 Vaccine 03/26/2020  8:37 AM 0.3 mL 01/10/2019 Intramuscular   Manufacturer: [employer], [employer]   Lot: [id number]   NDC: [address]-5

## 2020-05-26 ENCOUNTER — Other Ambulatory Visit: Payer: Self-pay | Admitting: Physician Assistant

## 2020-05-26 DIAGNOSIS — I152 Hypertension secondary to endocrine disorders: Secondary | ICD-10-CM

## 2020-05-26 DIAGNOSIS — E118 Type 2 diabetes mellitus with unspecified complications: Secondary | ICD-10-CM

## 2020-05-26 DIAGNOSIS — E1159 Type 2 diabetes mellitus with other circulatory complications: Secondary | ICD-10-CM

## 2020-05-26 DIAGNOSIS — I5021 Acute systolic (congestive) heart failure: Secondary | ICD-10-CM

## 2020-05-26 NOTE — Telephone Encounter (Signed)
Requested medication (s) are due for refill today: yes  Requested medication (s) are on the active medication list: yes  Last refill:  10/27/19  Future visit scheduled: no  Notes to clinic:  overdue for lab work   Requested Prescriptions  Pending Prescriptions Disp Refills   metFORMIN (GLUCOPHAGE) 500 MG tablet [Pharmacy Med Name: metFORMIN HCl 500 MG Oral Tablet] 180 tablet     Sig: Take 2 tablets by mouth twice daily      Endocrinology:  Diabetes - Biguanides Failed - 05/26/2020  4:26 PM      Failed - HBA1C is between 0 and 7.9 and within 180 days    Hemoglobin A1C  Date Value Ref Range Status  08/09/2019 12.2 (A) 4.0 - 5.6 % Final   Hgb A1c MFr Bld  Date Value Ref Range Status  03/03/2018 8.5 (H) 4.8 - 5.6 % Final    Comment:    (NOTE) Pre diabetes:          5.7%-6.4% Diabetes:              >6.4% Glycemic control for   <7.0% adults with diabetes           Failed - Valid encounter within last 6 months    Recent Outpatient Visits           9 months ago Chest pain, unspecified type   [facility] [place], Wendee Beavers, PA-C   1 year ago Hypertension associated with diabetes [facility])   [facility] Carles Collet M, [place]   2 years ago Hospital discharge follow-up   [place], [place], PA-C   2 years ago DM (diabetes mellitus) with complications [facility])   [facility] [place], Adriana M, [place]              Passed - Cr in normal range and within 360 days    Creat  Date Value Ref Range Status  12/14/2016 0.72 0.60 - 1.35 mg/dL Final   Creatinine, Ser  Date Value Ref Range Status  08/25/2019 0.93 0.76 - 1.27 mg/dL Final          Passed - eGFR in normal range and within 360 days    GFR, Est African American  Date Value Ref Range Status  12/14/2016 >89 >=60 mL/min Final   GFR calc Af Amer  Date Value Ref Range Status  08/25/2019 112 >59 mL/min/1.73 Final   GFR, Est Non African American  Date  Value Ref Range Status  12/14/2016 >89 >=60 mL/min Final   GFR calc non Af Amer  Date Value Ref Range Status  08/25/2019 97 >59 mL/min/1.73 Final           Signed Prescriptions Disp Refills   atorvastatin (LIPITOR) 40 MG tablet 90 tablet 0    Sig: TAKE 1 TABLET BY MOUTH ONCE DAILY AT  6PM      Cardiovascular:  Antilipid - Statins Failed - 05/26/2020  4:26 PM      Failed - LDL in normal range and within 360 days    LDL Chol Calc (NIH)  Date Value Ref Range Status  08/09/2019 96 0 - 99 mg/dL Final          Failed - HDL in normal range and within 360 days    HDL  Date Value Ref Range Status  08/09/2019 32 (L) >39 mg/dL Final          Failed - Triglycerides in normal range and within 360  days    Triglycerides  Date Value Ref Range Status  08/09/2019 255 (H) 0 - 149 mg/dL Final          Passed - Total Cholesterol in normal range and within 360 days    Cholesterol, Total  Date Value Ref Range Status  08/09/2019 171 100 - 199 mg/dL Final          Passed - Patient is not pregnant      Passed - Valid encounter within last 12 months    Recent Outpatient Visits           9 months ago Chest pain, unspecified type   [facility] [place], Fabio Bering M, PA-C   1 year ago Hypertension associated with diabetes [facility])   [facility] Carles Collet M, [place]   2 years ago Hospital discharge follow-up   [place], [place], PA-C   2 years ago DM (diabetes mellitus) with complications [facility])   [facility], Adriana M, [place]                spironolactone (ALDACTONE) 25 MG tablet 30 tablet 0    Sig: Take 1 tablet by mouth once daily      Cardiovascular: Diuretics - Aldosterone Antagonist Failed - 05/26/2020  4:26 PM      Failed - Last BP in normal range    BP Readings from Last 1 Encounters:  09/26/19 (!) 150/98          Failed - Valid encounter within last 6 months    Recent Outpatient Visits            9 months ago Chest pain, unspecified type   [facility] [place], Wendee Beavers, PA-C   1 year ago Hypertension associated with diabetes [facility])   [facility] Carles Collet M, [place]   2 years ago Hospital discharge follow-up   [place], [place], PA-C   2 years ago DM (diabetes mellitus) with complications [facility])   [facility] [place], Adriana M, [place]              Passed - Cr in normal range and within 360 days    Creat  Date Value Ref Range Status  12/14/2016 0.72 0.60 - 1.35 mg/dL Final   Creatinine, Ser  Date Value Ref Range Status  08/25/2019 0.93 0.76 - 1.27 mg/dL Final          Passed - K in normal range and within 360 days    Potassium  Date Value Ref Range Status  08/25/2019 4.3 3.5 - 5.2 mmol/L Final          Passed - Na in normal range and within 360 days    Sodium  Date Value Ref Range Status  08/25/2019 137 134 - 144 mmol/L Final

## 2020-05-26 NOTE — Telephone Encounter (Signed)
Requested Prescriptions  Pending Prescriptions Disp Refills  . atorvastatin (LIPITOR) 40 MG tablet [Pharmacy Med Name: Atorvastatin Calcium 40 MG Oral Tablet] 90 tablet 0    Sig: TAKE 1 TABLET BY MOUTH ONCE DAILY AT  6PM     Cardiovascular:  Antilipid - Statins Failed - 05/26/2020  4:26 PM      Failed - LDL in normal range and within 360 days    LDL Chol Calc (NIH)  Date Value Ref Range Status  08/09/2019 96 0 - 99 mg/dL Final         Failed - HDL in normal range and within 360 days    HDL  Date Value Ref Range Status  08/09/2019 32 (L) >39 mg/dL Final         Failed - Triglycerides in normal range and within 360 days    Triglycerides  Date Value Ref Range Status  08/09/2019 255 (H) 0 - 149 mg/dL Final         Passed - Total Cholesterol in normal range and within 360 days    Cholesterol, Total  Date Value Ref Range Status  08/09/2019 171 100 - 199 mg/dL Final         Passed - Patient is not pregnant      Passed - Valid encounter within last 12 months    Recent Outpatient Visits          9 months ago Chest pain, unspecified type   [facility] [place], Adriana M, PA-C   1 year ago Hypertension associated with diabetes [facility])   [facility] Carles Collet M, [place]   2 years ago Hospital discharge follow-up   [place], [place], PA-C   2 years ago DM (diabetes mellitus) with complications [facility])   [place], [place], [place]             . spironolactone (ALDACTONE) 25 MG tablet [Pharmacy Med Name: Spironolactone 25 MG Oral Tablet] 30 tablet 0    Sig: Take 1 tablet by mouth once daily     Cardiovascular: Diuretics - Aldosterone Antagonist Failed - 05/26/2020  4:26 PM      Failed - Last BP in normal range    BP Readings from Last 1 Encounters:  09/26/19 (!) 150/98         Failed - Valid encounter within last 6 months    Recent Outpatient Visits          9 months ago Chest pain,  unspecified type   [facility] [place], Wendee Beavers, PA-C   1 year ago Hypertension associated with diabetes [facility])   [facility] Carles Collet M, [place]   2 years ago Hospital discharge follow-up   [place], [place], PA-C   2 years ago DM (diabetes mellitus) with complications [facility])   [place], Adriana M, [place]             Passed - Cr in normal range and within 360 days    Creat  Date Value Ref Range Status  12/14/2016 0.72 0.60 - 1.35 mg/dL Final   Creatinine, Ser  Date Value Ref Range Status  08/25/2019 0.93 0.76 - 1.27 mg/dL Final         Passed - K in normal range and within 360 days    Potassium  Date Value Ref Range Status  08/25/2019 4.3 3.5 - 5.2 mmol/L Final  Passed - Na in normal range and within 360 days    Sodium  Date Value Ref Range Status  08/25/2019 137 134 - 144 mmol/L Final         . metFORMIN (GLUCOPHAGE) 500 MG tablet [Pharmacy Med Name: metFORMIN HCl 500 MG Oral Tablet] 180 tablet     Sig: Take 2 tablets by mouth twice daily     Endocrinology:  Diabetes - Biguanides Failed - 05/26/2020  4:26 PM      Failed - HBA1C is between 0 and 7.9 and within 180 days    Hemoglobin A1C  Date Value Ref Range Status  08/09/2019 12.2 (A) 4.0 - 5.6 % Final   Hgb A1c MFr Bld  Date Value Ref Range Status  03/03/2018 8.5 (H) 4.8 - 5.6 % Final    Comment:    (NOTE) Pre diabetes:          5.7%-6.4% Diabetes:              >6.4% Glycemic control for   <7.0% adults with diabetes          Failed - Valid encounter within last 6 months    Recent Outpatient Visits          9 months ago Chest pain, unspecified type   [facility] [place], Adriana M, PA-C   1 year ago Hypertension associated with diabetes [facility])   [facility] Carles Collet M, [place]   2 years ago Hospital discharge follow-up   [place], [place], [place]   2  years ago DM (diabetes mellitus) with complications [facility])   [facility] [place], Adriana M, [place]             Passed - Cr in normal range and within 360 days    Creat  Date Value Ref Range Status  12/14/2016 0.72 0.60 - 1.35 mg/dL Final   Creatinine, Ser  Date Value Ref Range Status  08/25/2019 0.93 0.76 - 1.27 mg/dL Final         Passed - eGFR in normal range and within 360 days    GFR, Est African American  Date Value Ref Range Status  12/14/2016 >89 >=60 mL/min Final   GFR calc Af Amer  Date Value Ref Range Status  08/25/2019 112 >59 mL/min/1.73 Final   GFR, Est Non African American  Date Value Ref Range Status  12/14/2016 >89 >=60 mL/min Final   GFR calc non Af Amer  Date Value Ref Range Status  08/25/2019 97 >59 mL/min/1.73 Final          Requested Prescriptions  Pending Prescriptions Disp Refills  . atorvastatin (LIPITOR) 40 MG tablet [Pharmacy Med Name: Atorvastatin Calcium 40 MG Oral Tablet] 90 tablet 0    Sig: TAKE 1 TABLET BY MOUTH ONCE DAILY AT  6PM     Cardiovascular:  Antilipid - Statins Failed - 05/26/2020  4:26 PM      Failed - LDL in normal range and within 360 days    LDL Chol Calc (NIH)  Date Value Ref Range Status  08/09/2019 96 0 - 99 mg/dL Final         Failed - HDL in normal range and within 360 days    HDL  Date Value Ref Range Status  08/09/2019 32 (L) >39 mg/dL Final         Failed - Triglycerides in normal range and within 360 days    Triglycerides  Date Value Ref Range  Status  08/09/2019 255 (H) 0 - 149 mg/dL Final         Passed - Total Cholesterol in normal range and within 360 days    Cholesterol, Total  Date Value Ref Range Status  08/09/2019 171 100 - 199 mg/dL Final         Passed - Patient is not pregnant      Passed - Valid encounter within last 12 months    Recent Outpatient Visits          9 months ago Chest pain, unspecified type   [facility] [place], Fabio Bering M, PA-C   1 year  ago Hypertension associated with diabetes [facility])   [place], [place], [place]   2 years ago Hospital discharge follow-up   [place], [place], PA-C   2 years ago DM (diabetes mellitus) with complications [facility])   [place], [place], [place]             . spironolactone (ALDACTONE) 25 MG tablet [Pharmacy Med Name: Spironolactone 25 MG Oral Tablet] 30 tablet 0    Sig: Take 1 tablet by mouth once daily     Cardiovascular: Diuretics - Aldosterone Antagonist Failed - 05/26/2020  4:26 PM      Failed - Last BP in normal range    BP Readings from Last 1 Encounters:  09/26/19 (!) 150/98         Failed - Valid encounter within last 6 months    Recent Outpatient Visits          9 months ago Chest pain, unspecified type   [facility] [place], Wendee Beavers, PA-C   1 year ago Hypertension associated with diabetes [facility])   [facility] Carles Collet M, [place]   2 years ago Hospital discharge follow-up   [place], [place], PA-C   2 years ago DM (diabetes mellitus) with complications [facility])   [place], Adriana M, [place]             Passed - Cr in normal range and within 360 days    Creat  Date Value Ref Range Status  12/14/2016 0.72 0.60 - 1.35 mg/dL Final   Creatinine, Ser  Date Value Ref Range Status  08/25/2019 0.93 0.76 - 1.27 mg/dL Final         Passed - K in normal range and within 360 days    Potassium  Date Value Ref Range Status  08/25/2019 4.3 3.5 - 5.2 mmol/L Final         Passed - Na in normal range and within 360 days    Sodium  Date Value Ref Range Status  08/25/2019 137 134 - 144 mmol/L Final         . metFORMIN (GLUCOPHAGE) 500 MG tablet [Pharmacy Med Name: metFORMIN HCl 500 MG Oral Tablet] 180 tablet     Sig: Take 2 tablets by mouth twice daily     Endocrinology:  Diabetes - Biguanides Failed - 05/26/2020  4:26 PM       Failed - HBA1C is between 0 and 7.9 and within 180 days    Hemoglobin A1C  Date Value Ref Range Status  08/09/2019 12.2 (A) 4.0 - 5.6 % Final   Hgb A1c MFr Bld  Date Value Ref Range Status  03/03/2018 8.5 (H) 4.8 - 5.6 % Final    Comment:    (NOTE) Pre diabetes:  5.7%-6.4% Diabetes:              >6.4% Glycemic control for   <7.0% adults with diabetes          Failed - Valid encounter within last 6 months    Recent Outpatient Visits          9 months ago Chest pain, unspecified type   [facility] [place], Fabio Bering M, PA-C   1 year ago Hypertension associated with diabetes [facility])   [facility] Carles Collet M, [place]   2 years ago [facility] discharge follow-up   [place], [place], [place]   2 years ago DM (diabetes mellitus) with complications [facility])   [facility] [place], Adriana M, [place]             Passed - Cr in normal range and within 360 days    Creat  Date Value Ref Range Status  12/14/2016 0.72 0.60 - 1.35 mg/dL Final   Creatinine, Ser  Date Value Ref Range Status  08/25/2019 0.93 0.76 - 1.27 mg/dL Final         Passed - eGFR in normal range and within 360 days    GFR, Est African American  Date Value Ref Range Status  12/14/2016 >89 >=60 mL/min Final   GFR calc Af Amer  Date Value Ref Range Status  08/25/2019 112 >59 mL/min/1.73 Final   GFR, Est Non African American  Date Value Ref Range Status  12/14/2016 >89 >=60 mL/min Final   GFR calc non Af Amer  Date Value Ref Range Status  08/25/2019 97 >59 mL/min/1.73 Final

## 2020-06-10 ENCOUNTER — Ambulatory Visit: Payer: Self-pay | Admitting: *Deleted

## 2020-06-10 NOTE — Telephone Encounter (Signed)
Opened in error. Please see previous TE for patient call.

## 2020-06-10 NOTE — Telephone Encounter (Signed)
Right foot swollen and hot to touch going on one month. Swelling extends 4 inches above the top of his foot which is having the most pain very painful to touch.Denies rash/bug bite/SOB/CP/discoloration. Denies any systemic fever. Has to keep sheet off the foot at night. . Tried tylenol/advil-does not help. Elevates his foot-causes increased swelling. Patient was initially offered appointment tomorrow and declined. Reviewed worsening symptoms requiring immediate evaluation. Prefers to keep Thursday's early morning appointment due to work.  Routing to clinic-if any openings should come available today or tomorrow morning please call patient.  Reason for Disposition . [1] Redness of the skin AND [2] no fever . [1] MODERATE pain (e.g., interferes with normal activities, limping) AND [2] present > 3 days  Answer Assessment - Initial Assessment Questions 1. ONSET: "When did the pain start?"     about one month ago 2. LOCATION: "Where is the pain located?"      Top of his foot mostly. Swelling extends to about 4 inches above the foot 3. PAIN: "How bad is the pain?"    (Scale 1-10; or mild, moderate, severe)  - MILD (1-3): doesn't interfere with normal activities.   - MODERATE (4-7): interferes with normal activities (e.g., work or school) or awakens from sleep, limping.   - SEVERE (8-10): excruciating pain, unable to do any normal activities, unable to walk.      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 4. WORK OR EXERCISE: "Has there been any recent work or exercise that involved this part of the body?"      Nothing new 5. CAUSE: "What do you think is causing the foot pain?"     unknown 6R SYMPTOMS: "Do you have any other symptoms?" (e.g., leg pain, rash, fever, numbness)     Top of foot is warm. Stated he has had bilat foot pain for years. Reports no rash but looks like something is appearing on the foot. Denies rash/bites 7. PREGNANCY: "Is there any chance you are pregnant?" "When was your last  menstrual period?"     na  Protocols used: FOOT PAIN-A-AH

## 2020-06-11 ENCOUNTER — Encounter: Payer: Self-pay | Admitting: Physician Assistant

## 2020-06-13 ENCOUNTER — Telehealth: Payer: Self-pay

## 2020-06-13 ENCOUNTER — Ambulatory Visit (INDEPENDENT_AMBULATORY_CARE_PROVIDER_SITE_OTHER): Payer: Managed Care, Other (non HMO) | Admitting: Physician Assistant

## 2020-06-13 ENCOUNTER — Encounter: Payer: Self-pay | Admitting: Physician Assistant

## 2020-06-13 VITALS — BP 124/84 | HR 79 | Temp 97.2°F | Resp 16 | Wt 210.0 lb

## 2020-06-13 DIAGNOSIS — E1159 Type 2 diabetes mellitus with other circulatory complications: Secondary | ICD-10-CM | POA: Diagnosis not present

## 2020-06-13 DIAGNOSIS — E118 Type 2 diabetes mellitus with unspecified complications: Secondary | ICD-10-CM

## 2020-06-13 DIAGNOSIS — M25571 Pain in right ankle and joints of right foot: Secondary | ICD-10-CM

## 2020-06-13 DIAGNOSIS — M25471 Effusion, right ankle: Secondary | ICD-10-CM

## 2020-06-13 DIAGNOSIS — I152 Hypertension secondary to endocrine disorders: Secondary | ICD-10-CM

## 2020-06-13 DIAGNOSIS — I10 Essential (primary) hypertension: Secondary | ICD-10-CM

## 2020-06-13 NOTE — Patient Instructions (Signed)
Gout  Gout is a condition that causes painful swelling of the joints. Gout is a type of inflammation of the joints (arthritis). This condition is caused by having too much uric acid in the body. Uric ac id is a chemical that forms when the body breaks down substances called purines. Purines are important for building body proteins. When the body has too much uric acid, sharp crystals can form and build up inside the joints. This causes pain and swelling. Gout attacks can happen quickly and may be very painful (acute gout). Over time, the attacks can affect more joints and become more frequent (chronic gout). Gout can also cause uric acid to build up under the skin and inside the kidneys. What are the causes? This condition is caused by too much uric acid in your blood. This can happen because:  Your kidneys do not remove enough uric acid from your blood. This is the most common cause.  Your body makes too much uric acid. This can happen with some cancers and cancer treatments. It can also occur if your body is breaking down too many red blood cells (hemolytic anemia).  You eat too many foods that are high in purines. These foods include organ meats and some seafood. Alcohol, especially beer, is also high in purines. A gout attack may be triggered by trauma or stress. What increases the risk? You are more likely to develop this condition if you:  Have a family history of gout.  Are male and middle-aged.  Are male and have gone through menopause.  Are obese.  Frequently drink alcohol, especially beer.  Are dehydrated.  Lose weight too quickly.  Have an organ transplant.  Have lead poisoning.  Take certain medicines, including aspirin, cyclosporine, diuretics, levodopa, and niacin.  Have kidney disease.  Have a skin condition called psoriasis. What are the signs or symptoms? An attack of acute gout happens quickly. It usually occurs in just one joint. The most common place is  the big toe. Attacks often start at night. Other joints that may be affected include joints of the feet, ankle, knee, fingers, wrist, or elbow. Symptoms of this condition may include:  Severe pain.  Warmth.  Swelling.  Stiffness.  Tenderness. The affected joint may be very painful to touch.  Shiny, red, or purple skin.  Chills and fever. Chronic gout may cause symptoms more frequently. More joints may be involved. You may also have white or yellow lumps (tophi) on your hands or feet or in other areas near your joints. How is this diagnosed? This condition is diagnosed based on your symptoms, medical history, and physical exam. You may have tests, such as:  Blood tests to measure uric acid levels.  Removal of joint fluid with a thin needle (aspiration) to look for uric acid crystals.  X-rays to look for joint damage. How is this treated? Treatment for this condition has two phases: treating an acute attack and preventing future attacks. Acute gout treatment may include medicines to reduce pain and swelling, including:  NSAIDs.  Steroids. These are strong anti-inflammatory medicines that can be taken by mouth (orally) or injected into a joint.  Colchicine. This medicine relieves pain and swelling when it is taken soon after an attack. It can be given by mouth or through an IV. Preventive treatment may include:  Daily use of smaller doses of NSAIDs or colchicine.  Use of a medicine that reduces uric acid levels in your blood.  Changes to your diet. You  may need to see a dietitian about what to eat and drink to prevent gout. Follow these instructions at home: During a gout attack   If directed, put ice on the affected area: ? Put ice in a plastic bag. ? Place a towel between your skin and the bag. ? Leave the ice on for 20 minutes, 2-3 times a day.  Raise (elevate) the affected joint above the level of your heart as often as possible.  Rest the joint as much as possible.  If the affected joint is in your leg, you may be given crutches to use.  Follow instructions from your health care provider about eating or drinking restrictions. Avoiding future gout attacks  Follow a low-purine diet as told by your dietitian or health care provider. Avoid foods and drinks that are high in purines, including liver, kidney, anchovies, asparagus, herring, mushrooms, mussels, and beer.  Maintain a healthy weight or lose weight if you are overweight. If you want to lose weight, talk with your health care provider. It is important that you do not lose weight too quickly.  Start or maintain an exercise program as told by your health care provider. Eating and drinking  Drink enough fluids to keep your urine pale yellow.  If you drink alcohol: ? Limit how much you use to:  0-1 drink a day for women.  0-2 drinks a day for men. ? Be aware of how much alcohol is in your drink. In the [place] one drink equals one 12 oz bottle of beer (355 mL) one 5 oz glass of wine (148 mL), or one 1 oz glass of hard liquor (44 mL). General instructions  Take over-the-counter and prescription medicines only as told by your health care provider.  Do not drive or use heavy machinery while taking prescription pain medicine.  Return to your normal activities as told by your health care provider. Ask your health care provider what activities are safe for you.  Keep all follow-up visits as told by your health care provider. This is important. Contact a health care provider if you have:  Another gout attack.  Continuing symptoms of a gout attack after 10 days of treatment.  Side effects from your medicines.  Chills or a fever.  Burning pain when you urinate.  Pain in your lower back or belly. Get help right away if you:  Have severe or uncontrolled pain.  Cannot urinate. Summary  Gout is painful swelling of the joints caused by inflammation.  The most common site of pain is the big  toe, but it can affect other joints in the body.  Medicines and dietary changes can help to prevent and treat gout attacks. This information is not intended to replace advice given to you by your health care provider. Make sure you discuss any questions you have with your health care provider. Document Revised: 05/25/2018 Document Reviewed: 05/25/2018 Elsevier Patient Education  2020 [employer]   Neuropathic Pain Neuropathic pain is pain caused by damage to the nerves that are responsible for certain sensations in your body (sensory nerves). The pain can be caused by:  Damage to the sensory nerves that send signals to your spinal cord and brain (peripheral nervous system).  Damage to the sensory nerves in your brain or spinal cord (central nervous system). Neuropathic pain can make you more sensitive to pain. Even a minor sensation can feel very painful. This is usually a long-term condition that can be difficult to treat. The type of  pain differs from person to person. It may:  Start suddenly (acute), or it may develop slowly and last for a long time (chronic).  Come and go as damaged nerves heal, or it may stay at the same level for years.  Cause emotional distress, loss of sleep, and a lower quality of life. What are the causes? The most common cause of this condition is diabetes. Many other diseases and conditions can also cause neuropathic pain. Causes of neuropathic pain can be classified as:  Toxic. This is caused by medicines and chemicals. The most common cause of toxic neuropathic pain is damage from cancer treatments (chemotherapy).  Metabolic. This can be caused by: ? Diabetes. This is the most common disease that damages the nerves. ? Lack of vitamin B from long-term alcohol abuse.  Traumatic. Any injury that cuts, crushes, or stretches a nerve can cause damage and pain. A common example is feeling pain after losing an arm or leg (phantom limb  pain).  Compression-related. If a sensory nerve gets trapped or compressed for a long period of time, the blood supply to the nerve can be cut off.  Vascular. Many blood vessel diseases can cause neuropathic pain by decreasing blood supply and oxygen to nerves.  Autoimmune. This type of pain results from diseases in which the body's defense system (immune system) mistakenly attacks sensory nerves. Examples of autoimmune diseases that can cause neuropathic pain include lupus and multiple sclerosis.  Infectious. Many types of viral infections can damage sensory nerves and cause pain. Shingles infection is a common cause of this type of pain.  Inherited. Neuropathic pain can be a symptom of many diseases that are passed down through families (genetic). What increases the risk? You are more likely to develop this condition if:  You have diabetes.  You smoke.  You drink too much alcohol.  You are taking certain medicines, including medicines that kill cancer cells (chemotherapy) or that treat immune system disorders. What are the signs or symptoms? The main symptom is pain. Neuropathic pain is often described as:  Burning.  Shock-like.  Stinging.  Hot or cold.  Itching. How is this diagnosed? No single test can diagnose neuropathic pain. It is diagnosed based on:  Physical exam and your symptoms. Your health care provider will ask you about your pain. You may be asked to use a pain scale to describe how bad your pain is.  Tests. These may be done to see if you have a high sensitivity to pain and to help find the cause and location of any sensory nerve damage. They include: ? Nerve conduction studies to test how well nerve signals travel through your sensory nerves (electrodiagnostic testing). ? Stimulating your sensory nerves through electrodes on your skin and measuring the response in your spinal cord and brain (somatosensory evoked potential).  Imaging studies, such  as: ? X-rays. ? CT scan. ? MRI. How is this treated? Treatment for neuropathic pain may change over time. You may need to try different treatment options or a combination of treatments. Some options include:  Treating the underlying cause of the neuropathy, such as diabetes, kidney disease, or vitamin deficiencies.  Stopping medicines that can cause neuropathy, such as chemotherapy.  Medicine to relieve pain. Medicines may include: ? Prescription or over-the-counter pain medicine. ? Anti-seizure medicine. ? Antidepressant medicines. ? Pain-relieving patches that are applied to painful areas of skin. ? A medicine to numb the area (local anesthetic), which can be injected as a nerve block.  Transcutaneous nerve stimulation. This uses electrical currents to block painful nerve signals. The treatment is painless.  Alternative treatments, such as: ? Acupuncture. ? Meditation. ? Massage. ? Physical therapy. ? Pain management programs. ? Counseling. Follow these instructions at home: Medicines   Take over-the-counter and prescription medicines only as told by your health care provider.  Do not drive or use heavy machinery while taking prescription pain medicine.  If you are taking prescription pain medicine, take actions to prevent or treat constipation. Your health care provider may recommend that you: ? Drink enough fluid to keep your urine pale yellow. ? Eat foods that are high in fiber, such as fresh fruits and vegetables, whole grains, and beans. ? Limit foods that are high in fat and processed sugars, such as fried or sweet foods. ? Take an over-the-counter or prescription medicine for constipation. Lifestyle   Have a good support system at home.  Consider joining a chronic pain support group.  Do not use any products that contain nicotine or tobacco, such as cigarettes and e-cigarettes. If you need help quitting, ask your health care provider.  Do not drink  alcohol. General instructions  Learn as much as you can about your condition.  Work closely with all your health care providers to find the treatment plan that works best for you.  Ask your health care provider what activities are safe for you.  Keep all follow-up visits as told by your health care provider. This is important. Contact a health care provider if:  Your pain treatments are not working.  You are having side effects from your medicines.  You are struggling with tiredness (fatigue), mood changes, depression, or anxiety. Summary  Neuropathic pain is pain caused by damage to the nerves that are responsible for certain sensations in your body (sensory nerves).  Neuropathic pain may come and go as damaged nerves heal, or it may stay at the same level for years.  Neuropathic pain is usually a long-term condition that can be difficult to treat. Consider joining a chronic pain support group. This information is not intended to replace advice given to you by your health care provider. Make sure you discuss any questions you have with your health care provider. Document Revised: 02/23/2019 Document Reviewed: 11/19/2017 Elsevier Patient Education  2020 [employer].

## 2020-06-13 NOTE — Progress Notes (Signed)
Established patient visit   Patient: Keith [name]   DOB: 1971-10-23   49 y.o. Male  MRN: [id number] Visit Date: 06/13/2020  Today's healthcare provider: Margaretann Loveless, PA-C   No chief complaint on file.  Subjective    Subjective:  Keith [name] is a 49 y.o. male who presents for evaluation of edema in the right ankle and foot. The edema has been moderate. Onset of symptoms was 2 months ago, and patient reports symptoms have gradually improved since that time. The edema is present all day. The patient states the problem is new. The swelling has been aggravated by nothing. The swelling has been relieved by IBU OTC for the past week. Cardiac risk factors: dyslipidemia, hypertension, male gender and smoking/ tobacco exposure.    Patient Active Problem List   Diagnosis Date Noted  . Coronary artery disease of native artery of native heart with stable angina pectoris (HCC) 08/09/2019  . Acute systolic heart failure (HCC) 07/11/2017  . Unstable angina (HCC)   . Other chest pain   . Migraine aura, persistent, with status migrainosus   . Hypertension associated with diabetes (HCC) 01/23/2015  . Slurred speech 01/23/2015  . Headache 01/23/2015  . DM (diabetes mellitus) with complications (HCC) 01/23/2015  . TIA (transient ischemic attack) 01/23/2015   Past Medical History:  Diagnosis Date  . Chronic combined systolic (congestive) and diastolic (congestive) heart failure (HCC)    a. 06/2017 Echo: EF 35-40%, Gr1 DD; b. 02/2018 Echo: Ef 30-35%; c. 03/2018 Echo: EF 35-40%, mid-apicalanteroseptal, ant, and apical HK. Mildly dil LA. No LV thrombus.  . Diabetes mellitus   . Erectile dysfunction   . Essential hypertension   . Frequent headaches   . NICM (nonischemic cardiomyopathy) (HCC)    a. 06/2017 Echo: EF 35-40%, Gr1 DD; b. 06/2017 Cath: nonobs dzs; c. 02/2018 Echo: Ef 30-35%; d. 03/2018 Echo: EF 35-40%, mid-apicalanteroseptal, ant, and apical HK. Mildly dil LA. No LV  thrombus.  . Non-obstructive Coronary Artery Disease    a. 06/2017 Cath: LM nl, LAD nl, D1/2/3 nl, LCX 30p, OM1 nl, OM2 min irregs, OM3/4 nl, RCA 30p/m, RPL2/3 nl.  . TIA (transient ischemic attack)    a. 02/2018 MRA w/o acute findings; b. 02/2018 Carotis U/S: no hemodynamically significant stenoses.  . Tobacco abuse        Medications: Outpatient Medications Prior to Visit  Medication Sig  . aspirin 81 MG chewable tablet Chew 1 tablet (81 mg total) by mouth daily.  [name] Kitchen atorvastatin (LIPITOR) 40 MG tablet TAKE 1 TABLET BY MOUTH ONCE DAILY AT  6PM  . furosemide (LASIX) 40 MG tablet Take 1 tablet (40 mg total) by mouth daily.  . metFORMIN (GLUCOPHAGE) 500 MG tablet Take 2 tablets by mouth twice daily  . sacubitril-valsartan (ENTRESTO) 97-103 MG Take 1 tablet by mouth 2 (two) times daily.  [name] Kitchen spironolactone (ALDACTONE) 25 MG tablet Take 1 tablet by mouth once daily  . carvedilol (COREG) 25 MG tablet Take 1 tablet (25 mg total) by mouth 2 (two) times daily.   No facility-administered medications prior to visit.    Review of Systems  Constitutional: Negative.   Respiratory: Negative.   Cardiovascular: Negative.   Musculoskeletal: Positive for arthralgias and joint swelling.    Last CBC Lab Results  Component Value Date   WBC 6.9 08/09/2019   HGB 15.1 08/09/2019   HCT 45.7 08/09/2019   MCV 87 08/09/2019   MCH 28.9 08/09/2019   RDW 12.8 08/09/2019  PLT 195 08/09/2019   Last metabolic panel Lab Results  Component Value Date   GLUCOSE 320 (H) 08/25/2019   NA 137 08/25/2019   K 4.3 08/25/2019   CL 95 (L) 08/25/2019   CO2 27 08/25/2019   BUN 10 08/25/2019   CREATININE 0.93 08/25/2019   GFRNONAA 97 08/25/2019   GFRAA 112 08/25/2019   CALCIUM 10.1 08/25/2019   PROT 7.0 08/09/2019   ALBUMIN 4.3 08/09/2019   LABGLOB 2.7 08/09/2019   AGRATIO 1.6 08/09/2019   BILITOT 1.3 (H) 08/09/2019   ALKPHOS 101 08/09/2019   AST 20 08/09/2019   ALT 30 08/09/2019   ANIONGAP 8 03/02/2018       Objective    BP 124/84 (BP Location: Left Arm, Patient Position: Sitting, Cuff Size: Large)   Pulse 79   Temp (!) 97.2 F (36.2 C) (Temporal)   Resp 16   Wt (!) 210 lb (95.3 kg)   BMI 30.13 kg/m  BP Readings from Last 3 Encounters:  06/13/20 124/84  09/26/19 (!) 150/98  09/05/19 (!) 148/100   Wt Readings from Last 3 Encounters:  06/13/20 (!) 210 lb (95.3 kg)  09/26/19 222 lb (100.7 kg)  09/05/19 217 lb 12 oz (98.8 kg)      Physical Exam Vitals reviewed.  Constitutional:      General: He is not in acute distress.    Appearance: Normal appearance. He is well-developed. He is obese. He is not ill-appearing.  HENT:     Head: Normocephalic and atraumatic.  Eyes:     Conjunctiva/sclera: Conjunctivae normal.  Pulmonary:     Effort: Pulmonary effort is normal. No respiratory distress.  Musculoskeletal:     Cervical back: Normal range of motion and neck supple.     Right ankle: No swelling. No tenderness. Normal range of motion. Normal pulse.     Right Achilles Tendon: Normal.  Neurological:     Mental Status: He is alert.  Psychiatric:        Mood and Affect: Mood normal.        Behavior: Behavior normal.        Thought Content: Thought content normal.        Judgment: Judgment normal.      No results found for any visits on 06/13/20.  Assessment & Plan     1. Acute right ankle pain Responded to Ibuprofen well thus far. Suspect gout and possible underlying neuropathy as well. Will check labs as below and f/u pending results. Advised to continue IBU up to 600mg  TID for 1-2 weeks. If uric acid is elevated we can discuss possible need for allopurinol. May also need to address neuropathy if "firey" pain on the top of the feet and toes does not improve. Discussed gabapentin some today. Can address further if needed.  - CBC w/Diff/Platelet - Comprehensive Metabolic Panel (CMET) - Uric acid - HgB A1c  2. Right ankle swelling See above medical treatment plan. - CBC  w/Diff/Platelet - Comprehensive Metabolic Panel (CMET) - Uric acid - HgB A1c  3. Hypertension associated with diabetes (HCC) BP much improved today. Continue medications. Will check labs as below and f/u pending results. - CBC w/Diff/Platelet - Comprehensive Metabolic Panel (CMET) - HgB A1c  4. DM (diabetes mellitus) with complications (HCC) Previously controlled. Will check labs as below and f/u pending results. - CBC w/Diff/Platelet - Comprehensive Metabolic Panel (CMET) - HgB A1c   No follow-ups on file.      I , PA-C, have  reviewed all documentation for this visit. The documentation on 06/13/20 for the exam, diagnosis, procedures, and orders are all accurate and complete.   Reine Just  [facility] [phone] (phone) [phone] (fax)  [facility]

## 2020-06-13 NOTE — Telephone Encounter (Signed)
Copied from CRM [id number]. Topic: General - Other >> Jun 13, 2020  9:39 AM Lyn Hollingshead D wrote: PT needs a work note for his appt today, he will pick it up later

## 2020-06-14 ENCOUNTER — Telehealth: Payer: Self-pay

## 2020-06-14 ENCOUNTER — Encounter: Payer: Self-pay | Admitting: Physician Assistant

## 2020-06-14 DIAGNOSIS — N1831 Chronic kidney disease, stage 3a: Secondary | ICD-10-CM

## 2020-06-14 DIAGNOSIS — M10371 Gout due to renal impairment, right ankle and foot: Secondary | ICD-10-CM

## 2020-06-14 DIAGNOSIS — R7989 Other specified abnormal findings of blood chemistry: Secondary | ICD-10-CM

## 2020-06-14 LAB — COMPREHENSIVE METABOLIC PANEL
ALT: 17 IU/L (ref 0–44)
AST: 17 IU/L (ref 0–40)
Albumin/Globulin Ratio: 1.7 (ref 1.2–2.2)
Albumin: 4.5 g/dL (ref 4.0–5.0)
Alkaline Phosphatase: 98 IU/L (ref 48–121)
BUN/Creatinine Ratio: 11 (ref 9–20)
BUN: 19 mg/dL (ref 6–24)
Bilirubin Total: 1.1 mg/dL (ref 0.0–1.2)
CO2: 25 mmol/L (ref 20–29)
Calcium: 9.7 mg/dL (ref 8.7–10.2)
Chloride: 94 mmol/L — ABNORMAL LOW (ref 96–106)
Creatinine, Ser: 1.68 mg/dL — ABNORMAL HIGH (ref 0.76–1.27)
GFR calc Af Amer: 54 mL/min/{1.73_m2} — ABNORMAL LOW (ref >59)
GFR calc non Af Amer: 47 mL/min/{1.73_m2} — ABNORMAL LOW (ref >59)
Globulin, Total: 2.7 g/dL (ref 1.5–4.5)
Glucose: 249 mg/dL — ABNORMAL HIGH (ref 65–99)
Potassium: 4 mmol/L (ref 3.5–5.2)
Sodium: 135 mmol/L (ref 134–144)
Total Protein: 7.2 g/dL (ref 6.0–8.5)

## 2020-06-14 LAB — CBC WITH DIFFERENTIAL/PLATELET
Basophils Absolute: 0 10*3/uL (ref 0.0–0.2)
Basos: 1 %
EOS (ABSOLUTE): 0.1 10*3/uL (ref 0.0–0.4)
Eos: 2 %
Hematocrit: 44.4 % (ref 37.5–51.0)
Hemoglobin: 15.2 g/dL (ref 13.0–17.7)
Immature Grans (Abs): 0 10*3/uL (ref 0.0–0.1)
Immature Granulocytes: 0 %
Lymphocytes Absolute: 2.3 10*3/uL (ref 0.7–3.1)
Lymphs: 26 %
MCH: 31.3 pg (ref 26.6–33.0)
MCHC: 34.2 g/dL (ref 31.5–35.7)
MCV: 91 fL (ref 79–97)
Monocytes Absolute: 0.6 10*3/uL (ref 0.1–0.9)
Monocytes: 6 %
Neutrophils Absolute: 5.8 10*3/uL (ref 1.4–7.0)
Neutrophils: 65 %
Platelets: 208 10*3/uL (ref 150–450)
RBC: 4.86 x10E6/uL (ref 4.14–5.80)
RDW: 12.6 % (ref 11.6–15.4)
WBC: 8.8 10*3/uL (ref 3.4–10.8)

## 2020-06-14 LAB — URIC ACID: Uric Acid: 6.9 mg/dL (ref 3.8–8.4)

## 2020-06-14 LAB — HEMOGLOBIN A1C
Est. average glucose Bld gHb Est-mCnc: 295 mg/dL
Hgb A1c MFr Bld: 11.9 % — ABNORMAL HIGH (ref 4.8–5.6)

## 2020-06-14 MED ORDER — DAPAGLIFLOZIN PROPANEDIOL 10 MG PO TABS: 10.0000 mg | ORAL_TABLET | Freq: Every day | ORAL | 1 refills | Status: DC

## 2020-06-14 NOTE — Telephone Encounter (Signed)
-----   Message from Margaretann Loveless, [place] sent at 06/14/2020  8:50 AM EDT ----- Blood count is normal. Your kidney function has drastically declined compared to last check 9 months ago. I would recommend Nephrology (kidney doctor) referral. Liver enzymes are normal. Sodium, potassium and calcium are normal. Uric acid is normal but with uncontrolled diabetes and your current kidney function, gout is still highly likely. A1c is down slightly compared to last check, was 12.2 and now 11.9. With your current A1c I would recommend to add another medication for better control of your diabetes. Also with the decline in kidney function we may have to stop your metformin to protect the kidneys.

## 2020-06-14 NOTE — Telephone Encounter (Signed)
Written by Margaretann Loveless, PA-C on 06/14/2020 8:50 AM EDT View Full Comments Seen by patient Keith [name] on 06/14/2020 8:52 AM

## 2020-06-15 ENCOUNTER — Encounter: Payer: Self-pay | Admitting: Physician Assistant

## 2020-06-17 MED ORDER — COLCHICINE 0.6 MG PO TABS: ORAL_TABLET | ORAL | 0 refills | Status: DC

## 2020-06-17 NOTE — Addendum Note (Signed)
Addended by: Margaretann Loveless on: 06/17/2020 07:24 AM   Modules accepted: Orders

## 2020-06-28 ENCOUNTER — Other Ambulatory Visit: Payer: Self-pay | Admitting: Physician Assistant

## 2020-06-28 DIAGNOSIS — E118 Type 2 diabetes mellitus with unspecified complications: Secondary | ICD-10-CM

## 2020-06-28 NOTE — Telephone Encounter (Signed)
Requested medication (s) are due for refill today: yes Requested medication (s) are on the active medication list:  yes  Last refill:  05/27/2020  Future visit scheduled: no  Notes to clinic: Patient was advise to schedule follow up and has not Please advise    Requested Prescriptions  Pending Prescriptions Disp Refills   metFORMIN (GLUCOPHAGE) 500 MG tablet [Pharmacy Med Name: metFORMIN HCl 500 MG Oral Tablet] 60 tablet 0    Sig: TAKE 2 TABLETS BY MOUTH TWICE DAILY PATIENT  NEEDS  TO  BE  SEEN  FOR  MORE  REFILLS      Endocrinology:  Diabetes - Biguanides Failed - 06/28/2020 12:46 PM      Failed - Cr in normal range and within 360 days    Creat  Date Value Ref Range Status  12/14/2016 0.72 0.60 - 1.35 mg/dL Final   Creatinine, Ser  Date Value Ref Range Status  06/13/2020 1.68 (H) 0.76 - 1.27 mg/dL Final          Failed - HBA1C is between 0 and 7.9 and within 180 days    Hgb A1c MFr Bld  Date Value Ref Range Status  06/13/2020 11.9 (H) 4.8 - 5.6 % Final    Comment:             Prediabetes: 5.7 - 6.4          Diabetes: >6.4          Glycemic control for adults with diabetes: <7.0           Failed - eGFR in normal range and within 360 days    GFR, Est African American  Date Value Ref Range Status  12/14/2016 >89 >=60 mL/min Final   GFR calc Af Amer  Date Value Ref Range Status  06/13/2020 54 (L) >59 mL/min/1.73 Final    Comment:    **[facility] currently reports eGFR in compliance with the current**   recommendations of the [employer]. [facility] will   update reporting as new guidelines are published from the NKF-ASN   Task force.    GFR, Est Non African American  Date Value Ref Range Status  12/14/2016 >89 >=60 mL/min Final   GFR calc non Af Amer  Date Value Ref Range Status  06/13/2020 47 (L) >59 mL/min/1.73 Final          Passed - Valid encounter within last 6 months    Recent Outpatient Visits           2 weeks ago Acute right ankle  pain   [place], [place]   10 months ago Chest pain, unspecified type   [facility] [place], [place], [place]   2 years ago Hypertension associated with diabetes [facility])   [facility] [place], [place]   2 years ago Hospital discharge follow-up   [place], [place], [place]   2 years ago DM (diabetes mellitus) with complications [facility])   [place], [place], [place]

## 2020-07-01 NOTE — Telephone Encounter (Signed)
[name] was on 06/13/2020 and no upcoming appointment. Medication was send into pharmacy.

## 2020-07-18 ENCOUNTER — Ambulatory Visit: Payer: Managed Care, Other (non HMO) | Admitting: Physician Assistant

## 2020-08-19 ENCOUNTER — Other Ambulatory Visit: Payer: Self-pay | Admitting: Nephrology

## 2020-08-19 DIAGNOSIS — I10 Essential (primary) hypertension: Secondary | ICD-10-CM

## 2020-08-19 DIAGNOSIS — IMO0002 Reserved for concepts with insufficient information to code with codable children: Secondary | ICD-10-CM

## 2020-08-19 DIAGNOSIS — N1831 Chronic kidney disease, stage 3a: Secondary | ICD-10-CM

## 2020-08-23 ENCOUNTER — Other Ambulatory Visit: Payer: Self-pay | Admitting: Physician Assistant

## 2020-08-23 DIAGNOSIS — E1159 Type 2 diabetes mellitus with other circulatory complications: Secondary | ICD-10-CM

## 2020-08-23 DIAGNOSIS — I152 Hypertension secondary to endocrine disorders: Secondary | ICD-10-CM

## 2020-08-23 DIAGNOSIS — I5021 Acute systolic (congestive) heart failure: Secondary | ICD-10-CM

## 2020-08-23 DIAGNOSIS — M10371 Gout due to renal impairment, right ankle and foot: Secondary | ICD-10-CM

## 2020-08-30 ENCOUNTER — Other Ambulatory Visit: Payer: Self-pay

## 2020-08-30 ENCOUNTER — Ambulatory Visit
Admission: RE | Admit: 2020-08-30 | Discharge: 2020-08-30 | Disposition: A | Discharge status: Home / Self Care | Payer: Managed Care, Other (non HMO) | Source: Ambulatory Visit | Attending: Nephrology | Admitting: Nephrology

## 2020-08-30 DIAGNOSIS — N1831 Chronic kidney disease, stage 3a: Secondary | ICD-10-CM | POA: Insufficient documentation

## 2020-08-30 DIAGNOSIS — E1165 Type 2 diabetes mellitus with hyperglycemia: Secondary | ICD-10-CM | POA: Diagnosis present

## 2020-08-30 DIAGNOSIS — E1129 Type 2 diabetes mellitus with other diabetic kidney complication: Secondary | ICD-10-CM | POA: Diagnosis present

## 2020-08-30 DIAGNOSIS — I10 Essential (primary) hypertension: Secondary | ICD-10-CM | POA: Insufficient documentation

## 2020-08-30 DIAGNOSIS — IMO0002 Reserved for concepts with insufficient information to code with codable children: Secondary | ICD-10-CM

## 2020-09-02 ENCOUNTER — Encounter: Payer: Self-pay | Admitting: Physician Assistant

## 2020-09-04 ENCOUNTER — Other Ambulatory Visit: Payer: Self-pay

## 2020-09-04 ENCOUNTER — Ambulatory Visit (INDEPENDENT_AMBULATORY_CARE_PROVIDER_SITE_OTHER): Payer: Managed Care, Other (non HMO) | Admitting: Physician Assistant

## 2020-09-04 ENCOUNTER — Encounter: Payer: Self-pay | Admitting: Physician Assistant

## 2020-09-04 VITALS — BP 139/99 | HR 89 | Temp 98.9°F | Resp 16 | Wt 207.6 lb

## 2020-09-04 DIAGNOSIS — M10371 Gout due to renal impairment, right ankle and foot: Secondary | ICD-10-CM | POA: Diagnosis not present

## 2020-09-04 DIAGNOSIS — M25571 Pain in right ankle and joints of right foot: Secondary | ICD-10-CM | POA: Diagnosis not present

## 2020-09-04 MED ORDER — ALLOPURINOL 100 MG PO TABS: 100.0000 mg | ORAL_TABLET | Freq: Every day | ORAL | 3 refills | Status: DC

## 2020-09-04 MED ORDER — COLCHICINE 0.6 MG PO TABS: ORAL_TABLET | ORAL | 1 refills | Status: DC

## 2020-09-04 NOTE — Patient Instructions (Signed)
Allopurinol tablets What is this medicine? ALLOPURINOL (al oh PURE i nole) reduces the amount of uric acid the body makes. It is used to treat the symptoms of gout. It is also used to treat or prevent high uric acid levels that occur as a result of certain types of chemotherapy. This medicine may also help patients who frequently have kidney stones. This medicine may be used for other purposes; ask your health care provider or pharmacist if you have questions. COMMON BRAND NAME(S): Zyloprim What should I tell my health care provider before I take this medicine? They need to know if you have any of these conditions:  kidney disease  liver disease  an unusual or allergic reaction to allopurinol, other medicines, foods, dyes, or preservatives  pregnant or trying to get pregnant  breast feeding How should I use this medicine? Take this medicine by mouth with a glass of water. Follow the directions on the prescription label. If this medicine upsets your stomach, take it with food or milk. Take your doses at regular intervals. Do not take your medicine more often than directed. Talk to your pediatrician regarding the use of this medicine in children. Special care may be needed. While this drug may be prescribed for children as young as 6 years for selected conditions, precautions do apply. Overdosage: If you think you have taken too much of this medicine contact a poison control center or emergency room at once. NOTE: This medicine is only for you. Do not share this medicine with others. What if I miss a dose? If you miss a dose, take it as soon as you can. If it is almost time for your next dose, take only that dose. Do not take double or extra doses. What may interact with this medicine? Do not take this medicine with the following medication:  didanosine, ddI This medicine may also interact with the following medications:  certain antibiotics like amoxicillin, ampicillin  certain  medicines for cancer  certain medicines for immunosuppression like azathioprine, cyclosporine, mercaptopurine  chlorpropamide  probenecid  thiazide diuretics, like hydrochlorothiazide  sulfinpyrazone  warfarin This list may not describe all possible interactions. Give your health care provider a list of all the medicines, herbs, non-prescription drugs, or dietary supplements you use. Also tell them if you smoke, drink alcohol, or use illegal drugs. Some items may interact with your medicine. What should I watch for while using this medicine? Visit your doctor or healthcare provider for regular checks on your progress. If you are taking this medicine to treat gout, you may not have less frequent attacks at first. Keep taking your medicine regularly and the attacks should get better within 2 to 6 weeks. Drink plenty of water (10 to 12 full glasses a day) while you are taking this medicine. This will help to reduce stomach upset and reduce the risk of getting gout or kidney stones. Call your doctor or healthcare provider at once if you get a skin rash together with chills, fever, sore throat, or nausea and vomiting, if you have blood in your urine, or difficulty passing urine. This medicine may cause serious skin reactions. They can happen weeks to months after starting the medicine. Contact your healthcare provider right away if you notice fevers or flu-like symptoms with a rash. The rash may be red or purple and then turn into blisters or peeling of the skin. Or, you might notice a red rash with swelling of the face, lips or lymph nodes in your neck  or under your arms. Do not take vitamin C without asking your doctor or healthcare provider. Too much vitamin C can increase the chance of getting kidney stones. You may get drowsy or dizzy. Do not drive, use machinery, or do anything that needs mental alertness until you know how this drug affects you. Do not stand or sit up quickly, especially if you  are an older patient. This reduces the risk of dizzy or fainting spells. Alcohol can make you more drowsy and dizzy. Alcohol can also increase the chance of stomach problems and increase the amount of uric acid in your blood. Avoid alcoholic drinks. What side effects may I notice from receiving this medicine? Side effects that you should report to your doctor or health care professional as soon as possible:  allergic reactions like skin rash, itching or hives, swelling of the face, lips, or tongue  breathing problems  joint pain  muscle pain  rash, fever, and swollen lymph nodes  redness, blistering, peeling, or loosening of the skin, including inside the mouth  signs and symptoms of infection like fever or chills; cough; sore throat  signs and symptoms of kidney injury like trouble passing urine or change in the amount of urine, flank pain  tingling, numbness in the hands or feet  unusual bleeding or bruising  unusually weak or tired Side effects that usually do not require medical attention (report to your doctor or health care professional if they continue or are bothersome):  changes in taste  diarrhea  drowsiness  headache  nausea, vomiting  stomach upset This list may not describe all possible side effects. Call your doctor for medical advice about side effects. You may report side effects to FDA at 1-800-FDA-1088. Where should I keep my medicine? Keep out of the reach of children. Store at room temperature between 15 and 25 degrees C (59 and 77 degrees F). Protect from light and moisture. Throw away any unused medicine after the expiration date. NOTE: This sheet is a summary. It may not cover all possible information. If you have questions about this medicine, talk to your doctor, pharmacist, or health care provider.  2020 Elsevier/Gold Standard (2019-01-24 09:41:46)  

## 2020-09-04 NOTE — Progress Notes (Signed)
Established patient visit   Patient: Keith [name]   DOB: August 09, 1971   49 y.o. Male  MRN: [id number] Visit Date: 09/04/2020  Today's healthcare provider: Margaretann Loveless, PA-C   Chief Complaint  Patient presents with  . Leg Swelling   Subjective    HPI  Patient here with c/o swelling and redness of the R ankle. This new flare up started last night at work. It is very sensitive to touch. Cannot stand a sock on it and last night he had to keep out of the sheets.  Reports that he took 2 colchicine today and swelling has improved. Decreased by about half. Still tender to touch.   Patient Active Problem List   Diagnosis Date Noted  . Coronary artery disease of native artery of native heart with stable angina pectoris (HCC) 08/09/2019  . Acute systolic heart failure (HCC) 07/11/2017  . Unstable angina (HCC)   . Other chest pain   . Migraine aura, persistent, with status migrainosus   . Hypertension associated with diabetes (HCC) 01/23/2015  . Slurred speech 01/23/2015  . Headache 01/23/2015  . DM (diabetes mellitus) with complications (HCC) 01/23/2015  . TIA (transient ischemic attack) 01/23/2015   Past Medical History:  Diagnosis Date  . Chronic combined systolic (congestive) and diastolic (congestive) heart failure (HCC)    a. 06/2017 Echo: EF 35-40%, Gr1 DD; b. 02/2018 Echo: Ef 30-35%; c. 03/2018 Echo: EF 35-40%, mid-apicalanteroseptal, ant, and apical HK. Mildly dil LA. No LV thrombus.  . Diabetes mellitus   . Erectile dysfunction   . Essential hypertension   . Frequent headaches   . NICM (nonischemic cardiomyopathy) (HCC)    a. 06/2017 Echo: EF 35-40%, Gr1 DD; b. 06/2017 Cath: nonobs dzs; c. 02/2018 Echo: Ef 30-35%; d. 03/2018 Echo: EF 35-40%, mid-apicalanteroseptal, ant, and apical HK. Mildly dil LA. No LV thrombus.  . Non-obstructive Coronary Artery Disease    a. 06/2017 Cath: LM nl, LAD nl, D1/2/3 nl, LCX 30p, OM1 nl, OM2 min irregs, OM3/4 nl, RCA 30p/m, RPL2/3 nl.   . TIA (transient ischemic attack)    a. 02/2018 MRA w/o acute findings; b. 02/2018 Carotis U/S: no hemodynamically significant stenoses.  . Tobacco abuse        Medications: Outpatient Medications Prior to Visit  Medication Sig  . aspirin 81 MG chewable tablet Chew 1 tablet (81 mg total) by mouth daily.  [name] Kitchen atorvastatin (LIPITOR) 40 MG tablet TAKE 1 TABLET BY MOUTH ONCE DAILY AT  6PM  . carvedilol (COREG) 25 MG tablet Take 1 tablet (25 mg total) by mouth 2 (two) times daily.  . colchicine 0.6 MG tablet START WITH 2 TABLETS BY MOUTH THEN TAKE ONE TABLET EVERY 12 HOURS AS NEEDED FOR PAIN  . dapagliflozin propanediol (FARXIGA) 10 MG TABS tablet Take 1 tablet (10 mg total) by mouth daily before breakfast.  . furosemide (LASIX) 40 MG tablet Take 1 tablet (40 mg total) by mouth daily.  . metFORMIN (GLUCOPHAGE) 500 MG tablet TAKE 2 TABLETS BY MOUTH TWICE DAILY  . sacubitril-valsartan (ENTRESTO) 97-103 MG Take 1 tablet by mouth 2 (two) times daily.  [name] Kitchen spironolactone (ALDACTONE) 25 MG tablet Take 1 tablet by mouth once daily   No facility-administered medications prior to visit.    Review of Systems  Constitutional: Negative.   Respiratory: Negative.   Cardiovascular: Negative.   Musculoskeletal: Positive for arthralgias and joint swelling.  Skin: Positive for color change.  Neurological: Positive for numbness (does also have some burning  on the top of the right foot that occurs intermittently.).    Last CBC Lab Results  Component Value Date   WBC 8.8 06/13/2020   HGB 15.2 06/13/2020   HCT 44.4 06/13/2020   MCV 91 06/13/2020   MCH 31.3 06/13/2020   RDW 12.6 06/13/2020   PLT 208 06/13/2020   Last metabolic panel Lab Results  Component Value Date   GLUCOSE 249 (H) 06/13/2020   NA 135 06/13/2020   K 4.0 06/13/2020   CL 94 (L) 06/13/2020   CO2 25 06/13/2020   BUN 19 06/13/2020   CREATININE 1.68 (H) 06/13/2020   GFRNONAA 47 (L) 06/13/2020   GFRAA 54 (L) 06/13/2020   CALCIUM 9.7  06/13/2020   PROT 7.2 06/13/2020   ALBUMIN 4.5 06/13/2020   LABGLOB 2.7 06/13/2020   AGRATIO 1.7 06/13/2020   BILITOT 1.1 06/13/2020   ALKPHOS 98 06/13/2020   AST 17 06/13/2020   ALT 17 06/13/2020   ANIONGAP 8 03/02/2018      Objective    BP (!) 139/99 (BP Location: Left Arm, Patient Position: Sitting, Cuff Size: Large)   Pulse 89   Temp 98.9 F (37.2 C) (Oral)   Resp 16   Wt 207 lb 9.6 oz (94.2 kg)   BMI 29.79 kg/m  BP Readings from Last 3 Encounters:  09/04/20 (!) 139/99  06/13/20 124/84  09/26/19 (!) 150/98   Wt Readings from Last 3 Encounters:  09/04/20 207 lb 9.6 oz (94.2 kg)  06/13/20 (!) 210 lb (95.3 kg)  09/26/19 222 lb (100.7 kg)      Physical Exam Vitals reviewed.  Constitutional:      General: He is not in acute distress.    Appearance: Normal appearance. He is well-developed. He is not ill-appearing.  HENT:     Head: Normocephalic and atraumatic.  Eyes:     Conjunctiva/sclera: Conjunctivae normal.  Cardiovascular:     Pulses: Normal pulses.  Pulmonary:     Effort: Pulmonary effort is normal. No respiratory distress.  Musculoskeletal:     Cervical back: Normal range of motion and neck supple.     Right lower leg: No edema.     Left lower leg: No edema.     Right ankle: Swelling (and erythematous) present. Tenderness present. Decreased range of motion. Anterior drawer test negative. Normal pulse.  Neurological:     Mental Status: He is alert.  Psychiatric:        Behavior: Behavior normal.        Thought Content: Thought content normal.        Judgment: Judgment normal.      No results found for any visits on 09/04/20.  Assessment & Plan     1. Acute right ankle pain Suspect recurrent gout. Had improved after the initial treatment but no recurring. WIll get xray as below to r/o OA. Will start allopurinol. Continue colchicine for prn use during acute flares. Allopurinol will be used daily for prevention. Will f/u in 1 month for recheck and  for f/u T2DM.  - DG Ankle Complete Right; Future  2. Acute gout due to renal impairment involving right ankle See above medical treatment plan. - allopurinol (ZYLOPRIM) 100 MG tablet; Take 1 tablet (100 mg total) by mouth daily.  Dispense: 90 tablet; Refill: 3 - colchicine 0.6 MG tablet; START WITH 2 TABLETS BY MOUTH THEN TAKE ONE TABLET EVERY 12 HOURS AS NEEDED FOR PAIN  Dispense: 90 tablet; Refill: 1   No follow-ups on file.  Delmer Islam, PA-C, have reviewed all documentation for this visit. The documentation on 09/05/20 for the exam, diagnosis, procedures, and orders are all accurate and complete.   Reine Just  [facility] [phone] (phone) [phone] (fax)  [facility]

## 2020-09-27 ENCOUNTER — Other Ambulatory Visit: Payer: Self-pay | Admitting: Cardiovascular Disease

## 2020-09-27 NOTE — Telephone Encounter (Signed)
Please schedule office visit for refills. Thank you! 

## 2020-09-27 NOTE — Telephone Encounter (Signed)
Scheduled

## 2020-09-30 NOTE — Progress Notes (Signed)
Office Visit    Patient Name: Keith [name] Date of Encounter: 10/01/2020  Primary Care Provider:  Margaretann Loveless, PA-C Primary Cardiologist:  Julien Nordmann, MD Electrophysiologist:  None   Chief Complaint    Keith [name] is a 49 y.o. male with a hx of tobacco use, dilated cardiomyopathy secondary to hypertensive heart disease, chronic systolic heart failure, CAD, DM2 presents today for follow up of CAD and heart failure.   Past Medical History    Past Medical History:  Diagnosis Date  . Chronic combined systolic (congestive) and diastolic (congestive) heart failure (HCC)    a. 06/2017 Echo: EF 35-40%, Gr1 DD; b. 02/2018 Echo: Ef 30-35%; c. 03/2018 Echo: EF 35-40%, mid-apicalanteroseptal, ant, and apical HK. Mildly dil LA. No LV thrombus.  . Diabetes mellitus   . Erectile dysfunction   . Essential hypertension   . Frequent headaches   . NICM (nonischemic cardiomyopathy) (HCC)    a. 06/2017 Echo: EF 35-40%, Gr1 DD; b. 06/2017 Cath: nonobs dzs; c. 02/2018 Echo: Ef 30-35%; d. 03/2018 Echo: EF 35-40%, mid-apicalanteroseptal, ant, and apical HK. Mildly dil LA. No LV thrombus.  . Non-obstructive Coronary Artery Disease    a. 06/2017 Cath: LM nl, LAD nl, D1/2/3 nl, LCX 30p, OM1 nl, OM2 min irregs, OM3/4 nl, RCA 30p/m, RPL2/3 nl.  . TIA (transient ischemic attack)    a. 02/2018 MRA w/o acute findings; b. 02/2018 Carotis U/S: no hemodynamically significant stenoses.  . Tobacco abuse    Past Surgical History:  Procedure Laterality Date  . COLONOSCOPY WITH PROPOFOL N/A 02/19/2017   Procedure: COLONOSCOPY WITH PROPOFOL;  Surgeon: [name], MD;  Location: [facility] ENDOSCOPY;  Service: Endoscopy;  Laterality: N/A;  . ESOPHAGOGASTRODUODENOSCOPY (EGD) WITH PROPOFOL N/A 02/19/2017   Procedure: ESOPHAGOGASTRODUODENOSCOPY (EGD) WITH PROPOFOL;  Surgeon: [name], MD;  Location: [facility] ENDOSCOPY;  Service: Endoscopy;  Laterality: N/A;  . LEFT HEART CATH AND CORONARY ANGIOGRAPHY N/A  07/11/2017   Procedure: LEFT HEART CATH AND CORONARY ANGIOGRAPHY;  Surgeon: [name], MD;  Location: [facility] INVASIVE CV LAB;  Service: Cardiovascular;  Laterality: N/A;    Allergies  Allergies  Allergen Reactions  . Other Other (See Comments)    Muscle Relaxers (caused psychotic break) Other reaction(s): Other (See Comments) Muscle Relaxers (caused psychotic break)    History of Present Illness    Keith [name] is a 49 y.o. 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 He was last seen 09/26/19 by Dr. Mariah Milling.  Previous LHC 06/2017 with proximal circumflex 30%, proximal RCA 30%, mid to distal RCA, 30% with moderate to severe LV systolic dysfunction.  In the setting of mild nonobstructive coronary disease and reduced EF 30 to 35% presumed due to hypertensive heart disease.  His most recent echocardiogram 03/2018 shows LVEF 35-40%.  He has been maintained on Coreg, losartan, spironolactone, Lasix, potassium.  He was last seen 09/26/2019 and transition from losartan to [facility].  He was seen by nephrology, Dr. Wynelle Link, and had an renal ultrasound 08/30/2020 due to CKD 3A.  It showed right renal cyst measuring up to, left superior pole 5.7 mm echogenicity calculus versus scarring.  Presents today for follow-up.  He is down 12 pounds in the last year.  Does endorse some difficulties with GI upset and nausea, indigestion-encouraged to discuss with his primary care provider.   Reports no shortness of breath nor dyspnea on exertion. Reports no chest pain,  pressure, or tightness. No edema, orthopnea, PND. Reports no palpitations.   He is stopped his carvedilol as he was concerned he had an interaction with his colchicine.  We reviewed the indication for carvedilol in the setting of his his heart failure, hypertension, CAD.  He was agreeable to resume.  Reports his BP has been well controlled at home.   Endorses eating low-salt, heart healthy diet.  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  EKGs/Labs/Other Studies Reviewed:   The following studies were reviewed today:  Echo 03/2018 Study Conclusions   - Procedure narrative: LIMITED TTE to assess for LV apical thrombus    and LVF.  - Left ventricle: The cavity size was normal. Systolic function was    moderately reduced. The estimated ejection fraction was in the    range of 35% to 40%. Hypokinesis of the mid-apicalanteroseptal,    anterior, and apical myocardium.  - Left atrium: The atrium was mildly dilated.   Impressions:   - No LV thrombus.   Echo 02/2018 Left ventricle: The cavity size was mildly dilated. Systolic    function was moderately to severely reduced. The estimated    ejection fraction was in the range of 30% to 35%. Hypokinesis of    the anterior myocardium. Hypokinesis of the anteroseptal    myocardium. Hypokinesis of the apical myocardium.  - Mitral valve: There was mild regurgitation.  - Left atrium: The atrium was mildly dilated.  - Right ventricle: Systolic function was normal.  - Pulmonary arteries: Systolic pressure was mild to moderately    elevated PA peak pressure: 44 mm Hg (S).   Impressions:   - Unable to exclude apical thrombus. Severe apical hypokinesis, if    not akinesis. Consider imaging of apical region with definity if    clinically indicated.   Echo 06/2107 Procedure narrative: Transthoracic echocardiography. Image    quality was poor. The study was technically difficult, as a    result of poor acoustic windows and poor sound wave transmission.    Intravenous contrast (Definity) was administered.  - Left ventricle: The cavity size was mildly dilated. There was    mild concentric hypertrophy. Systolic function was moderately    reduced. The estimated ejection fraction was in the range of 35%    to 40%. Diffuse  hypokinesis. Features are consistent with a    pseudonormal left ventricular filling pattern, with concomitant    abnormal relaxation and increased filling pressure (grade 2    diastolic dysfunction).  - Left atrium: The atrium was mildly dilated.   LHC 06/2017  Prox Cx lesion, 30 %stenosed.  Prox RCA lesion, 30 %stenosed.  Mid RCA to Dist RCA lesion, 30 %stenosed.  There is moderate to severe left ventricular systolic dysfunction.  LV end diastolic pressure is moderately elevated.  There is trivial (1+) mitral regurgitation.   1. Mild nonobstructive coronary artery disease. 2. Moderately to severely reduced LV systolic function with an EF of 30-35% likely due to hypertensive heart disease. 3. Moderately elevated left ventricular end-diastolic pressure. 4. Aortic root angiography showed no evidence of ascending aortic aneurysm or dissection.   Recommendations: I suspect that the patient's symptoms are likely due to acute systolic heart failure due to hypertensive heart disease with new left bundle branch block. There is no culprit for unstable angina. We need to start him on heart failure medications. I initiated treatment with carvedilol and Entresto (the patient  tolerated losartan in the past). Gentle diuresis with oral furosemide. Smoking cessation is advised.   EKG:  EKG is  ordered today.  The ekg ordered today demonstrates normal sinus rhythm 83 bpm with right superior axis deviation LVH with repolarization abnormality.  Recent Labs: 06/13/2020: ALT 17; BUN 19; Creatinine, Ser 1.68; Hemoglobin 15.2; Platelets 208; Potassium 4.0; Sodium 135  Recent Lipid Panel    Component Value Date/Time   CHOL 171 08/09/2019 0933   TRIG 255 (H) 08/09/2019 0933   HDL 32 (L) 08/09/2019 0933   CHOLHDL 5.3 (H) 08/09/2019 0933   CHOLHDL 4.3 03/03/2018 0610   VLDL 33 03/03/2018 0610   LDLCALC 96 08/09/2019 0933    Home Medications   Current Meds  Medication Sig  . allopurinol (ZYLOPRIM)  100 MG tablet Take 1 tablet (100 mg total) by mouth daily.  [name] Kitchen aspirin 81 MG chewable tablet Chew 1 tablet (81 mg total) by mouth daily.  [name] Kitchen atorvastatin (LIPITOR) 40 MG tablet TAKE 1 TABLET BY MOUTH ONCE DAILY AT  6PM  . colchicine 0.6 MG tablet START WITH 2 TABLETS BY MOUTH THEN TAKE ONE TABLET EVERY 12 HOURS AS NEEDED FOR PAIN  . dapagliflozin propanediol (FARXIGA) 10 MG TABS tablet Take 1 tablet (10 mg total) by mouth daily before breakfast.  . ENTRESTO 97-103 MG Take 1 tablet by mouth twice daily  . furosemide (LASIX) 40 MG tablet Take 1 tablet (40 mg total) by mouth daily.  . metFORMIN (GLUCOPHAGE) 500 MG tablet TAKE 2 TABLETS BY MOUTH TWICE DAILY  . spironolactone (ALDACTONE) 25 MG tablet Take 1 tablet by mouth once daily     Review of Systems  All other systems reviewed and are otherwise negative except as noted above.  Physical Exam    VS:  BP 126/90 (BP Location: Left Arm, Patient Position: Sitting, Cuff Size: Normal)   Pulse 83   Ht 5\' 10"  (1.778 m)   Wt 210 lb 12.8 oz (95.6 kg)   SpO2 98%   BMI 30.25 kg/m  , BMI Body mass index is 30.25 kg/m.  Wt Readings from Last 3 Encounters:  10/01/20 210 lb 12.8 oz (95.6 kg)  09/04/20 207 lb 9.6 oz (94.2 kg)  06/13/20 (!) 210 lb (95.3 kg)    GEN: Well nourished, well developed, in no acute distress. HEENT: normal. Neck: Supple, no JVD, carotid bruits, or masses. Cardiac: RRR, no murmurs, rubs, or gallops. No clubbing, cyanosis, edema.  Radials/PT 2+ and equal bilaterally.  Respiratory:  Respirations regular and unlabored, clear to auscultation bilaterally. GI: Soft, nontender, nondistended. MS: No deformity or atrophy. Skin: Warm and dry, no rash. Neuro:  Strength and sensation are intact. Psych: Normal affect.  Assessment & Plan    1. Dilated cardiomyopathy/HFrEF/hypertensive heart disease- Euvolemic and well compensated on exam.  No dyspnea nor edema.  Encouraged to continue low-sodium, healthy diet.  His GDMT includes  Farxiga 10mg , Entresto 97-103mg  BID, Spironolactone 25mg  QD.  We will reduce his Lasix to 20 mg daily as he is euvolemic.  We will proceed 0.5 mg twice daily as he previously self discontinued.  Update echocardiogram for reassessment of LVEF.  2. CAD-reports no anginal symptoms.  No indication for ischemic evaluation at this time.  GDMT includes aspirin, statin.  We will resume Coreg at 12.5 mg twice daily.  He had self discontinued his he was concerned to had an interaction with one of his other medications.  3. DM2-06/13/2020 A1c 11.9.  He has been started on dapagliflozin and  Metformin by his primary care provider.  Appreciate inclusion of SGLT2 I  4. HLD - Continue ATorvastatin 40mg  daily.   Disposition: Follow up in 6 month(s) with Dr. or APP   Signed, Mariah Milling, NP 10/01/2020, 8:25 AM [facility]

## 2020-10-01 ENCOUNTER — Encounter: Payer: Self-pay | Admitting: Family

## 2020-10-01 ENCOUNTER — Other Ambulatory Visit: Payer: Self-pay

## 2020-10-01 ENCOUNTER — Ambulatory Visit: Payer: Managed Care, Other (non HMO) | Admitting: Family

## 2020-10-01 VITALS — BP 126/90 | HR 83 | Ht 70.0 in | Wt 210.8 lb

## 2020-10-01 DIAGNOSIS — E119 Type 2 diabetes mellitus without complications: Secondary | ICD-10-CM

## 2020-10-01 DIAGNOSIS — I5042 Chronic combined systolic (congestive) and diastolic (congestive) heart failure: Secondary | ICD-10-CM

## 2020-10-01 DIAGNOSIS — I5020 Unspecified systolic (congestive) heart failure: Secondary | ICD-10-CM

## 2020-10-01 DIAGNOSIS — E782 Mixed hyperlipidemia: Secondary | ICD-10-CM | POA: Diagnosis not present

## 2020-10-01 DIAGNOSIS — I428 Other cardiomyopathies: Secondary | ICD-10-CM

## 2020-10-01 DIAGNOSIS — I25118 Atherosclerotic heart disease of native coronary artery with other forms of angina pectoris: Secondary | ICD-10-CM

## 2020-10-01 DIAGNOSIS — E785 Hyperlipidemia, unspecified: Secondary | ICD-10-CM

## 2020-10-01 DIAGNOSIS — I152 Hypertension secondary to endocrine disorders: Secondary | ICD-10-CM

## 2020-10-01 DIAGNOSIS — E1159 Type 2 diabetes mellitus with other circulatory complications: Secondary | ICD-10-CM

## 2020-10-01 MED ORDER — CARVEDILOL 12.5 MG PO TABS: 12.5000 mg | ORAL_TABLET | Freq: Two times a day (BID) | ORAL | 3 refills | Status: DC

## 2020-10-01 MED ORDER — ENTRESTO 97-103 MG PO TABS: 1.0000 | ORAL_TABLET | Freq: Two times a day (BID) | ORAL | 3 refills | Status: DC

## 2020-10-01 MED ORDER — FUROSEMIDE 40 MG PO TABS: 20.0000 mg | ORAL_TABLET | Freq: Every day | ORAL | 3 refills | Status: DC

## 2020-10-01 NOTE — Patient Instructions (Addendum)
Medication Instructions:  Your physician has recommended you make the following change in your medication:   RESUME Carvedilol (Coreg) 12.5mg  twice daily *this will help to continue to improve your heart pumping function  CHANGE Lasix to 20mg  (half tablet) once daily  Recommend trying Nexium or Omeprazole/Prilosec once daily over the counter. Discuss with , PA the next time you see her.   *If you need a refill on your cardiac medications before your next appointment, please call your pharmacy*  Lab Work: No lab work today.   Testing/Procedures: Your provider has requested that you have an echocardiogram. Echocardiography is a painless test that uses sound waves to create images of your heart. It provides your doctor with information about the size and shape of your heart and how well your heart's chambers and valves are working. This procedure takes approximately one hour. There are no restrictions for this procedure.  Follow-Up: At [facility], you and your health needs are our priority.  As part of our continuing mission to provide you with exceptional heart care, we have created designated Provider Care Teams.  These Care Teams include your primary Cardiologist (physician) and Advanced Practice Providers (APPs -  Physician Assistants and Nurse Practitioners) who all work together to provide you with the care you need, when you need it.  We recommend signing up for the patient portal called "MyChart".  Sign up information is provided on this After Visit Summary.  MyChart is used to connect with patients for Virtual Visits (Telemedicine).  Patients are able to view lab/test results, encounter notes, upcoming appointments, etc.  Non-urgent messages can be sent to your provider as well.   To learn more about what you can do with MyChart, go to [facility].    Your next appointment:   6 month(s)  The format for your next appointment:   In Person  Provider:    You may see [email or web address], MD or one of the following Advanced Practice Providers on your designated Care Team:    Julien Nordmann, NP  Nicolasa Ducking, PA-C  Eula Listen, PA-C  Cadence Marisue Ivan, Fransico Michael  [place], NP  Other Instructions  Keep up your low salt, heart healthy diet.  Keep up the good work staying active!

## 2020-10-02 ENCOUNTER — Ambulatory Visit: Payer: Self-pay | Admitting: Physician Assistant

## 2020-10-13 ENCOUNTER — Encounter: Payer: Self-pay | Admitting: Family

## 2020-10-14 ENCOUNTER — Ambulatory Visit: Payer: Self-pay | Admitting: Physician Assistant

## 2020-10-14 MED ORDER — ENTRESTO 97-103 MG PO TABS: 1.0000 | ORAL_TABLET | Freq: Two times a day (BID) | ORAL | 2 refills | Status: DC

## 2020-10-22 ENCOUNTER — Other Ambulatory Visit: Payer: Managed Care, Other (non HMO)

## 2020-10-23 NOTE — Progress Notes (Signed)
Established patient visit   Patient: Keith [name]   DOB: 1971-01-01   49 y.o. Male  MRN: [id number] Visit Date: 10/25/2020  Today's healthcare provider: Margaretann Loveless, PA-C   Chief Complaint  Patient presents with  . Follow-up   Subjective    HPI  Diabetes Mellitus Type II, Follow-up  Lab Results  Component Value Date   HGBA1C 11.3 (H) 10/25/2020   HGBA1C 11.9 (H) 06/13/2020   HGBA1C 12.2 (A) 08/09/2019   Wt Readings from Last 3 Encounters:  10/25/20 206 lb 8 oz (93.7 kg)  10/01/20 210 lb 12.8 oz (95.6 kg)  09/04/20 207 lb 9.6 oz (94.2 kg)   Last seen for diabetes 3 months ago.  Management since then includes "recommend to add another medication for better control of your diabetes. Also with the decline in kidney function we may have to stop your metformin to protect the kidneys." He reports excellent compliance with treatment. He is not having side effects.   Symptoms: No fatigue No foot ulcerations  No appetite changes No nausea  No paresthesia of the feet  No polydipsia  No polyuria No visual disturbances   No vomiting     Home blood sugar records: not being cheked  Episodes of hypoglycemia? No    Most Recent Eye Exam: not UTD Current exercise: no regular exercise Current diet habits: well balanced  Pertinent Labs: Lab Results  Component Value Date   CHOL 171 08/09/2019   HDL 32 (L) 08/09/2019   LDLCALC 96 08/09/2019   TRIG 255 (H) 08/09/2019   CHOLHDL 5.3 (H) 08/09/2019   Lab Results  Component Value Date   NA 133 (L) 10/25/2020   K 4.0 10/25/2020   CREATININE 1.07 10/25/2020   GFRNONAA 81 10/25/2020   GFRAA 93 10/25/2020   GLUCOSE 360 (H) 10/25/2020     --------------------------------------------------------------------------------------------------- Follow up for Acute gout due to renal impairment involving right ankle:  The patient was last seen for this 4 weeks ago. Changes made at last visit include Will start  allopurinol. Continue colchicine for prn use during acute flares. Allopurinol will be used daily for prevention .  He reports excellent compliance with treatment. He feels that condition is Improved. He is not having side effects.   -----------------------------------------------------------------------------------------  Patient Active Problem List   Diagnosis Date Noted  . Coronary artery disease of native artery of native heart with stable angina pectoris (HCC) 08/09/2019  . Acute systolic heart failure (HCC) 07/11/2017  . Unstable angina (HCC)   . Other chest pain   . Migraine aura, persistent, with status migrainosus   . Hypertension associated with diabetes (HCC) 01/23/2015  . Slurred speech 01/23/2015  . Headache 01/23/2015  . DM (diabetes mellitus) with complications (HCC) 01/23/2015  . TIA (transient ischemic attack) 01/23/2015   Past Medical History:  Diagnosis Date  . Chronic combined systolic (congestive) and diastolic (congestive) heart failure (HCC)    a. 06/2017 Echo: EF 35-40%, Gr1 DD; b. 02/2018 Echo: Ef 30-35%; c. 03/2018 Echo: EF 35-40%, mid-apicalanteroseptal, ant, and apical HK. Mildly dil LA. No LV thrombus.  . Diabetes mellitus   . Erectile dysfunction   . Essential hypertension   . Frequent headaches   . NICM (nonischemic cardiomyopathy) (HCC)    a. 06/2017 Echo: EF 35-40%, Gr1 DD; b. 06/2017 Cath: nonobs dzs; c. 02/2018 Echo: Ef 30-35%; d. 03/2018 Echo: EF 35-40%, mid-apicalanteroseptal, ant, and apical HK. Mildly dil LA. No LV thrombus.  . Non-obstructive Coronary Artery Disease  a. 06/2017 Cath: LM nl, LAD nl, D1/2/3 nl, LCX 30p, OM1 nl, OM2 min irregs, OM3/4 nl, RCA 30p/m, RPL2/3 nl.  . TIA (transient ischemic attack)    a. 02/2018 MRA w/o acute findings; b. 02/2018 Carotis U/S: no hemodynamically significant stenoses.  . Tobacco abuse        Medications: Outpatient Medications Prior to Visit  Medication Sig  . allopurinol (ZYLOPRIM) 100 MG tablet Take 1  tablet (100 mg total) by mouth daily.  [name] Kitchen aspirin 81 MG chewable tablet Chew 1 tablet (81 mg total) by mouth daily.  . carvedilol (COREG) 12.5 MG tablet Take 1 tablet (12.5 mg total) by mouth 2 (two) times daily.  . colchicine 0.6 MG tablet START WITH 2 TABLETS BY MOUTH THEN TAKE ONE TABLET EVERY 12 HOURS AS NEEDED FOR PAIN  . dapagliflozin propanediol (FARXIGA) 10 MG TABS tablet Take 1 tablet (10 mg total) by mouth daily before breakfast.  . furosemide (LASIX) 40 MG tablet Take 0.5 tablets (20 mg total) by mouth daily.  . metFORMIN (GLUCOPHAGE) 500 MG tablet TAKE 2 TABLETS BY MOUTH TWICE DAILY  . sacubitril-valsartan (ENTRESTO) 97-103 MG Take 1 tablet by mouth 2 (two) times daily.  [name] Kitchen spironolactone (ALDACTONE) 25 MG tablet Take 1 tablet by mouth once daily  . [DISCONTINUED] atorvastatin (LIPITOR) 40 MG tablet TAKE 1 TABLET BY MOUTH ONCE DAILY AT  6PM   No facility-administered medications prior to visit.    Review of Systems  Constitutional: Negative.   Respiratory: Negative.   Cardiovascular: Negative.   Endocrine: Negative.   Neurological: Negative.     Last CBC Lab Results  Component Value Date   WBC 8.8 06/13/2020   HGB 15.2 06/13/2020   HCT 44.4 06/13/2020   MCV 91 06/13/2020   MCH 31.3 06/13/2020   RDW 12.6 06/13/2020   PLT 208 06/13/2020   Last metabolic panel Lab Results  Component Value Date   GLUCOSE 360 (H) 10/25/2020   NA 133 (L) 10/25/2020   K 4.0 10/25/2020   CL 91 (L) 10/25/2020   CO2 23 10/25/2020   BUN 24 10/25/2020   CREATININE 1.07 10/25/2020   GFRNONAA 81 10/25/2020   GFRAA 93 10/25/2020   CALCIUM 9.5 10/25/2020   PHOS 4.0 10/25/2020   PROT 7.2 06/13/2020   ALBUMIN 4.5 10/25/2020   LABGLOB 2.7 06/13/2020   AGRATIO 1.7 06/13/2020   BILITOT 1.1 06/13/2020   ALKPHOS 98 06/13/2020   AST 17 06/13/2020   ALT 17 06/13/2020   ANIONGAP 8 03/02/2018      Objective    BP (!) 150/90 (BP Location: Left Arm, Patient Position: Sitting, Cuff Size:  Normal)   Pulse 93   Temp 98.5 F (36.9 C) (Oral)   Resp 16   Wt 206 lb 8 oz (93.7 kg)   BMI 29.63 kg/m  BP Readings from Last 3 Encounters:  10/25/20 (!) 150/90  10/01/20 126/90  09/04/20 (!) 139/99   Wt Readings from Last 3 Encounters:  10/25/20 206 lb 8 oz (93.7 kg)  10/01/20 210 lb 12.8 oz (95.6 kg)  09/04/20 207 lb 9.6 oz (94.2 kg)      Physical Exam Vitals reviewed.  Constitutional:      General: He is not in acute distress.    Appearance: Normal appearance. He is well-developed and well-nourished. He is obese. He is not ill-appearing or diaphoretic.  HENT:     Head: Normocephalic and atraumatic.  Cardiovascular:     Rate and Rhythm: Normal rate and regular  rhythm.     Heart sounds: Normal heart sounds. No murmur heard. No friction rub. No gallop.   Pulmonary:     Effort: Pulmonary effort is normal. No respiratory distress.     Breath sounds: Normal breath sounds. No wheezing or rales.  Musculoskeletal:     Cervical back: Normal range of motion and neck supple.     Right lower leg: No edema.     Left lower leg: No edema.  Neurological:     Mental Status: He is alert.       Results for orders placed or performed in visit on 10/25/20  Renal function panel  Result Value Ref Range   Glucose 360 (H) 65 - 99 mg/dL   BUN 24 6 - 24 mg/dL   Creatinine, Ser 0.62 0.76 - 1.27 mg/dL   GFR calc non Af Amer 81 >59 mL/min/1.73   GFR calc Af Amer 93 >59 mL/min/1.73   BUN/Creatinine Ratio 22 (H) 9 - 20   Sodium 133 (L) 134 - 144 mmol/L   Potassium 4.0 3.5 - 5.2 mmol/L   Chloride 91 (L) 96 - 106 mmol/L   CO2 23 20 - 29 mmol/L   Calcium 9.5 8.7 - 10.2 mg/dL   Phosphorus 4.0 2.8 - 4.1 mg/dL   Albumin 4.5 4.0 - 5.0 g/dL  Hemoglobin I9S  Result Value Ref Range   Hgb A1c MFr Bld 11.3 (H) 4.8 - 5.6 %   Est. average glucose Bld gHb Est-mCnc 278 mg/dL  Uric acid  Result Value Ref Range   Uric Acid 5.2 3.8 - 8.4 mg/dL    Assessment & Plan     1. Acute systolic heart  failure (HCC) Stable. Continue Entresto and furosemide. Followed by Cardiology. Will check labs as below and f/u pending results. - Renal function panel  2. Hypertension associated with diabetes (HCC) Elevated today. Advised to monitor at home and call if elevated >140/90 at home. Will check labs as below and f/u pending results. - Renal function panel - Hemoglobin A1c  3. DM (diabetes mellitus) with complications (HCC) Improving. Continue farxiga 10mg , Trulicity 0.75mg  weekly, metformin 500mg  2 tabs BID. Will check labs as below and f/u pending results. F/U in 3 months.  - Renal function panel - Hemoglobin A1c  4. 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 No recurrent flares since starting. Will check uric acid to make sure therapeutic and allopurinol dose does not need to be adjusted.  - Uric acid   No follow-ups on file.      , PA-C, have reviewed all documentation for this visit. The documentation on 11/18/20 for the exam, diagnosis, procedures, and orders are all accurate and complete.   Delmer Islam  [facility] [phone] (phone) [phone] (fax)  [facility]

## 2020-10-25 ENCOUNTER — Encounter: Payer: Self-pay | Admitting: Physician Assistant

## 2020-10-25 ENCOUNTER — Ambulatory Visit: Payer: Managed Care, Other (non HMO) | Admitting: Physician Assistant

## 2020-10-25 ENCOUNTER — Other Ambulatory Visit: Payer: Self-pay

## 2020-10-25 VITALS — BP 150/90 | HR 93 | Temp 98.5°F | Resp 16 | Wt 206.5 lb

## 2020-10-25 DIAGNOSIS — E1159 Type 2 diabetes mellitus with other circulatory complications: Secondary | ICD-10-CM | POA: Diagnosis not present

## 2020-10-25 DIAGNOSIS — M1A3710 Chronic gout due to renal impairment, right ankle and foot, without tophus (tophi): Secondary | ICD-10-CM

## 2020-10-25 DIAGNOSIS — I5021 Acute systolic (congestive) heart failure: Secondary | ICD-10-CM

## 2020-10-25 DIAGNOSIS — E118 Type 2 diabetes mellitus with unspecified complications: Secondary | ICD-10-CM | POA: Diagnosis not present

## 2020-10-25 DIAGNOSIS — I152 Hypertension secondary to endocrine disorders: Secondary | ICD-10-CM

## 2020-10-25 MED ORDER — ONETOUCH VERIO VI STRP: ORAL_STRIP | 12 refills | Status: DC

## 2020-10-25 MED ORDER — ONETOUCH VERIO W/DEVICE KIT: PACK | 0 refills | Status: DC

## 2020-10-25 MED ORDER — ONETOUCH ULTRASOFT LANCETS MISC: 12 refills | Status: DC

## 2020-10-25 NOTE — Patient Instructions (Signed)
Diabetes Mellitus and Foot Care Foot care is an important part of your health, especially when you have diabetes. Diabetes may cause you to have problems because of poor blood flow (circulation) to your feet and legs, which can cause your skin to:  Become thinner and drier.  Break more easily.  Heal more slowly.  Peel and crack. You may also have nerve damage (neuropathy) in your legs and feet, causing decreased feeling in them. This means that you may not notice minor injuries to your feet that could lead to more serious problems. Noticing and addressing any potential problems early is the best way to prevent future foot problems. How to care for your feet Foot hygiene  Wash your feet daily with warm water and mild soap. Do not use hot water. Then, pat your feet and the areas between your toes until they are completely dry. Do not soak your feet as this can dry your skin.  Trim your toenails straight across. Do not dig under them or around the cuticle. File the edges of your nails with an emery board or nail file.  Apply a moisturizing lotion or petroleum jelly to the skin on your feet and to dry, brittle toenails. Use lotion that does not contain alcohol and is unscented. Do not apply lotion between your toes. Shoes and socks  Wear clean socks or stockings every day. Make sure they are not too tight. Do not wear knee-high stockings since they may decrease blood flow to your legs.  Wear shoes that fit properly and have enough cushioning. Always look in your shoes before you put them on to be sure there are no objects inside.  To break in new shoes, wear them for just a few hours a day. This prevents injuries on your feet. Wounds, scrapes, corns, and calluses  Check your feet daily for blisters, cuts, bruises, sores, and redness. If you cannot see the bottom of your feet, use a mirror or ask someone for help.  Do not cut corns or calluses or try to remove them with medicine.  If you  find a minor scrape, cut, or break in the skin on your feet, keep it and the skin around it clean and dry. You may clean these areas with mild soap and water. Do not clean the area with peroxide, alcohol, or iodine.  If you have a wound, scrape, corn, or callus on your foot, look at it several times a day to make sure it is healing and not infected. Check for: ? Redness, swelling, or pain. ? Fluid or blood. ? Warmth. ? Pus or a bad smell. General instructions  Do not cross your legs. This may decrease blood flow to your feet.  Do not use heating pads or hot water bottles on your feet. They may burn your skin. If you have lost feeling in your feet or legs, you may not know this is happening until it is too late.  Protect your feet from hot and cold by wearing shoes, such as at the beach or on hot pavement.  Schedule a complete foot exam at least once a year (annually) or more often if you have foot problems. If you have foot problems, report any cuts, sores, or bruises to your health care provider immediately. Contact a health care provider if:  You have a medical condition that increases your risk of infection and you have any cuts, sores, or bruises on your feet.  You have an injury that is not   healing.  You have redness on your legs or feet.  You feel burning or tingling in your legs or feet.  You have pain or cramps in your legs and feet.  Your legs or feet are numb.  Your feet always feel cold.  You have pain around a toenail. Get help right away if:  You have a wound, scrape, corn, or callus on your foot and: ? You have pain, swelling, or redness that gets worse. ? You have fluid or blood coming from the wound, scrape, corn, or callus. ? Your wound, scrape, corn, or callus feels warm to the touch. ? You have pus or a bad smell coming from the wound, scrape, corn, or callus. ? You have a fever. ? You have a red line going up your leg. Summary  Check your feet every day  for cuts, sores, red spots, swelling, and blisters.  Moisturize feet and legs daily.  Wear shoes that fit properly and have enough cushioning.  If you have foot problems, report any cuts, sores, or bruises to your health care provider immediately.  Schedule a complete foot exam at least once a year (annually) or more often if you have foot problems. This information is not intended to replace advice given to you by your health care provider. Make sure you discuss any questions you have with your health care provider. Document Revised: 07/26/2019 Document Reviewed: 12/04/2016 [employer] Patient Education  2020 [employer]  

## 2020-10-26 ENCOUNTER — Encounter: Payer: Self-pay | Admitting: Physician Assistant

## 2020-10-26 ENCOUNTER — Encounter: Payer: Self-pay | Admitting: Family

## 2020-10-26 LAB — RENAL FUNCTION PANEL
Albumin: 4.5 g/dL (ref 4.0–5.0)
BUN/Creatinine Ratio: 22 — ABNORMAL HIGH (ref 9–20)
BUN: 24 mg/dL (ref 6–24)
CO2: 23 mmol/L (ref 20–29)
Calcium: 9.5 mg/dL (ref 8.7–10.2)
Chloride: 91 mmol/L — ABNORMAL LOW (ref 96–106)
Creatinine, Ser: 1.07 mg/dL (ref 0.76–1.27)
GFR calc Af Amer: 93 mL/min/{1.73_m2} (ref >59)
GFR calc non Af Amer: 81 mL/min/{1.73_m2} (ref >59)
Glucose: 360 mg/dL — ABNORMAL HIGH (ref 65–99)
Phosphorus: 4 mg/dL (ref 2.8–4.1)
Potassium: 4 mmol/L (ref 3.5–5.2)
Sodium: 133 mmol/L — ABNORMAL LOW (ref 134–144)

## 2020-10-26 LAB — URIC ACID: Uric Acid: 5.2 mg/dL (ref 3.8–8.4)

## 2020-10-26 LAB — HEMOGLOBIN A1C
Est. average glucose Bld gHb Est-mCnc: 278 mg/dL
Hgb A1c MFr Bld: 11.3 % — ABNORMAL HIGH (ref 4.8–5.6)

## 2020-10-28 NOTE — Telephone Encounter (Signed)
Addressed in result note.  

## 2020-10-29 ENCOUNTER — Other Ambulatory Visit: Payer: Self-pay | Admitting: Physician Assistant

## 2020-10-29 DIAGNOSIS — E118 Type 2 diabetes mellitus with unspecified complications: Secondary | ICD-10-CM

## 2020-10-29 MED ORDER — TRULICITY 0.75 MG/0.5ML ~~LOC~~ SOAJ: 0.7500 mg | SUBCUTANEOUS | 3 refills | Status: DC

## 2020-11-15 ENCOUNTER — Other Ambulatory Visit: Payer: Self-pay | Admitting: Physician Assistant

## 2020-11-15 DIAGNOSIS — I152 Hypertension secondary to endocrine disorders: Secondary | ICD-10-CM

## 2020-11-15 DIAGNOSIS — E1159 Type 2 diabetes mellitus with other circulatory complications: Secondary | ICD-10-CM

## 2020-11-18 ENCOUNTER — Encounter: Payer: Self-pay | Admitting: Physician Assistant

## 2020-11-19 ENCOUNTER — Other Ambulatory Visit: Payer: Managed Care, Other (non HMO)

## 2020-11-22 ENCOUNTER — Ambulatory Visit: Payer: Managed Care, Other (non HMO) | Admitting: Adult Health

## 2020-12-06 ENCOUNTER — Other Ambulatory Visit: Payer: Managed Care, Other (non HMO)

## 2021-01-06 ENCOUNTER — Other Ambulatory Visit: Payer: Self-pay | Admitting: Physician Assistant

## 2021-01-06 DIAGNOSIS — I5020 Unspecified systolic (congestive) heart failure: Secondary | ICD-10-CM

## 2021-01-06 DIAGNOSIS — E118 Type 2 diabetes mellitus with unspecified complications: Secondary | ICD-10-CM

## 2021-01-19 ENCOUNTER — Encounter: Payer: Self-pay | Admitting: Physician Assistant

## 2021-01-19 ENCOUNTER — Other Ambulatory Visit: Payer: Self-pay | Admitting: Physician Assistant

## 2021-01-19 DIAGNOSIS — I5020 Unspecified systolic (congestive) heart failure: Secondary | ICD-10-CM

## 2021-01-23 ENCOUNTER — Other Ambulatory Visit: Payer: Self-pay

## 2021-01-23 ENCOUNTER — Ambulatory Visit: Payer: Managed Care, Other (non HMO) | Admitting: Physician Assistant

## 2021-01-23 ENCOUNTER — Encounter: Payer: Self-pay | Admitting: Physician Assistant

## 2021-01-23 VITALS — BP 160/97 | HR 82 | Temp 98.5°F | Resp 16 | Wt 205.3 lb

## 2021-01-23 DIAGNOSIS — E118 Type 2 diabetes mellitus with unspecified complications: Secondary | ICD-10-CM | POA: Diagnosis not present

## 2021-01-23 DIAGNOSIS — Z6829 Body mass index (BMI) 29.0-29.9, adult: Secondary | ICD-10-CM

## 2021-01-23 DIAGNOSIS — E1140 Type 2 diabetes mellitus with diabetic neuropathy, unspecified: Secondary | ICD-10-CM

## 2021-01-23 DIAGNOSIS — I5020 Unspecified systolic (congestive) heart failure: Secondary | ICD-10-CM

## 2021-01-23 DIAGNOSIS — I25118 Atherosclerotic heart disease of native coronary artery with other forms of angina pectoris: Secondary | ICD-10-CM

## 2021-01-23 DIAGNOSIS — I83813 Varicose veins of bilateral lower extremities with pain: Secondary | ICD-10-CM

## 2021-01-23 MED ORDER — GABAPENTIN 300 MG PO CAPS: 300.0000 mg | ORAL_CAPSULE | Freq: Every day | ORAL | 1 refills | Status: DC

## 2021-01-23 MED ORDER — FUROSEMIDE 40 MG PO TABS: 40.0000 mg | ORAL_TABLET | Freq: Every day | ORAL | 3 refills | Status: DC

## 2021-01-23 NOTE — Patient Instructions (Signed)
Gabapentin capsules or tablets What is this medicine? GABAPENTIN (GA ba pen tin) is used to control seizures in certain types of epilepsy. It is also used to treat certain types of nerve pain. This medicine may be used for other purposes; ask your health care provider or pharmacist if you have questions. COMMON BRAND NAME(S): Active-PAC with Gabapentin, Ascencion Dike, Gralise, Neurontin What should I tell my health care provider before I take this medicine? They need to know if you have any of these conditions:  history of drug abuse or alcohol abuse problem  kidney disease  lung or breathing disease  suicidal thoughts, plans, or attempt; a previous suicide attempt by you or a family member  an unusual or allergic reaction to gabapentin, other medicines, foods, dyes, or preservatives  pregnant or trying to get pregnant  breast-feeding How should I use this medicine? Take this medicine by mouth with a glass of water. Follow the directions on the prescription label. You can take it with or without food. If it upsets your stomach, take it with food. Take your medicine at regular intervals. Do not take it more often than directed. Do not stop taking except on your doctor's advice. If you are directed to break the 600 or 800 mg tablets in half as part of your dose, the extra half tablet should be used for the next dose. If you have not used the extra half tablet within 28 days, it should be thrown away. A special MedGuide will be given to you by the pharmacist with each prescription and refill. Be sure to read this information carefully each time. Talk to your pediatrician regarding the use of this medicine in children. While this drug may be prescribed for children as young as 3 years for selected conditions, precautions do apply. Overdosage: If you think you have taken too much of this medicine contact a poison control center or emergency room at once. NOTE: This medicine is only for you. Do not  share this medicine with others. What if I miss a dose? If you miss a dose, take it as soon as you can. If it is almost time for your next dose, take only that dose. Do not take double or extra doses. What may interact with this medicine? This medicine may interact with the following medications:  alcohol  antihistamines for allergy, cough, and cold  certain medicines for anxiety or sleep  certain medicines for depression like amitriptyline, fluoxetine, sertraline  certain medicines for seizures like phenobarbital, primidone  certain medicines for stomach problems  general anesthetics like halothane, isoflurane, methoxyflurane, propofol  local anesthetics like lidocaine, pramoxine, tetracaine  medicines that relax muscles for surgery  narcotic medicines for pain  phenothiazines like chlorpromazine, mesoridazine, prochlorperazine, thioridazine This list may not describe all possible interactions. Give your health care provider a list of all the medicines, herbs, non-prescription drugs, or dietary supplements you use. Also tell them if you smoke, drink alcohol, or use illegal drugs. Some items may interact with your medicine. What should I watch for while using this medicine? Visit your doctor or health care provider for regular checks on your progress. You may want to keep a record at home of how you feel your condition is responding to treatment. You may want to share this information with your doctor or health care provider at each visit. You should contact your doctor or health care provider if your seizures get worse or if you have any new types of seizures. Do not stop  taking this medicine or any of your seizure medicines unless instructed by your doctor or health care provider. Stopping your medicine suddenly can increase your seizures or their severity. This medicine may cause serious skin reactions. They can happen weeks to months after starting the medicine. Contact your health  care provider right away if you notice fevers or flu-like symptoms with a rash. The rash may be red or purple and then turn into blisters or peeling of the skin. Or, you might notice a red rash with swelling of the face, lips or lymph nodes in your neck or under your arms. Wear a medical identification bracelet or chain if you are taking this medicine for seizures, and carry a card that lists all your medications. You may get drowsy, dizzy, or have blurred vision. Do not drive, use machinery, or do anything that needs mental alertness until you know how this medicine affects you. To reduce dizzy or fainting spells, do not sit or stand up quickly, especially if you are an older patient. Alcohol can increase drowsiness and dizziness. Avoid alcoholic drinks. Your mouth may get dry. Chewing sugarless gum or sucking hard candy, and drinking plenty of water will help. The use of this medicine may increase the chance of suicidal thoughts or actions. Pay special attention to how you are responding while on this medicine. Any worsening of mood, or thoughts of suicide or dying should be reported to your health care provider right away. Women who become pregnant while using this medicine may enroll in the [place] [employer] Antiepileptic Drug Pregnancy Registry by calling [phone]. This registry collects information about the safety of antiepileptic drug use during pregnancy. What side effects may I notice from receiving this medicine? Side effects that you should report to your doctor or health care professional as soon as possible:  allergic reactions like skin rash, itching or hives, swelling of the face, lips, or tongue  breathing problems  rash, fever, and swollen lymph nodes  redness, blistering, peeling or loosening of the skin, including inside the mouth  suicidal thoughts, mood changes Side effects that usually do not require medical attention (report to your doctor or health care professional if  they continue or are bothersome):  dizziness  drowsiness  headache  nausea, vomiting  swelling of ankles, feet, hands  tiredness This list may not describe all possible side effects. Call your doctor for medical advice about side effects. You may report side effects to FDA at 1-800-FDA-1088. Where should I keep my medicine? Keep out of reach of children. This medicine may cause accidental overdose and death if it taken by other adults, children, or pets. Mix any unused medicine with a substance like cat litter or coffee grounds. Then throw the medicine away in a sealed container like a sealed bag or a coffee can with a lid. Do not use the medicine after the expiration date. Store at room temperature between 15 and 30 degrees C (59 and 86 degrees F). NOTE: This sheet is a summary. It may not cover all possible information. If you have questions about this medicine, talk to your doctor, pharmacist, or health care provider.  2021 Elsevier/Gold Standard (2019-02-03 14:16:43) Varicose Veins Varicose veins are veins that have become enlarged, bulged, and twisted. They most often appear in the legs. What are the causes? This condition is caused by damage to the valves in the vein. These valves help blood return to your heart. When they are damaged and they stop working properly, blood may  flow backward and back up in the veins near the skin, causing the veins to get larger and appear twisted. The condition can result from any issue that causes blood to back up, like pregnancy, prolonged standing, or obesity. What increases the risk? This condition is more likely to develop in people who are:  On their feet a lot.  Pregnant.  Overweight. What are the signs or symptoms? Symptoms of this condition include:  Bulging, twisted, and bluish veins.  A feeling of heaviness. This may be worse at the end of the day.  Leg pain. This may be worse at the end of the day.  Swelling in the  leg.  Changes in skin color over the veins. How is this diagnosed? This condition may be diagnosed based on your symptoms, a physical exam, and an ultrasound test. How is this treated? Treatment for this condition may involve:  Avoiding sitting or standing in one position for long periods of time.  Wearing compression stockings. These stockings help to prevent blood clots and reduce swelling in the legs.  Raising (elevating) the legs when resting.  Losing weight.  Exercising regularly. If you have persistent symptoms or want to improve the way your varicose veins look, you may choose to have a procedure to close the varicose veins off or to remove them. Treatments to close off the veins include:  Sclerotherapy. In this treatment, a solution is injected into a vein to close it off.  Laser treatment. In this treatment, the vein is heated with a laser to close it off.  Radiofrequency vein ablation. In this treatment, an electrical current produced by radio waves is used to close off the vein. Treatments to remove the veins include:  Phlebectomy. In this treatment, the veins are removed through small incisions made over the veins.  Vein ligation and stripping. In this treatment, incisions are made over the veins. The veins are then removed after being tied (ligated) with stitches (sutures). Follow these instructions at home: Activity  Walk as much as possible. Walking increases blood flow. This helps blood return to the heart and takes pressure off your veins. It also increases your cardiovascular strength.  Follow your health care provider's instructions about exercising.  Do not stand or sit in one position for a long period of time.  Do not sit with your legs crossed.  Rest with your legs raised during the day. General instructions  Follow any diet instructions given to you by your health care provider.  Wear compression stockings as directed by your health care provider.  Do not wear other kinds of tight clothing around your legs, pelvis, or waist.  Elevate your legs at night to above the level of your heart.  If you get a cut in the skin over the varicose vein and the vein bleeds: ? Lie down with your leg raised. ? Apply firm pressure to the cut with a clean cloth until the bleeding stops. ? Place a bandage (dressing) on the cut.   Contact a health care provider if:  The skin around your varicose veins starts to break down.  You have pain, redness, tenderness, or hard swelling over a vein.  You are uncomfortable because of pain.  You get a cut in the skin over a varicose vein and it will not stop bleeding. Summary  Varicose veins are veins that have become enlarged, bulged, and twisted. They most often appear in the legs.  This condition is caused by damage to the valves  in the vein. These valves help blood return to your heart.  Treatment for this condition includes frequent movements, wearing compression stockings, losing weight, and exercising regularly. In some cases, procedures are done to close off or remove the veins.  Treatment for this condition may include wearing compression stockings, elevating the legs, losing weight, and engaging in regular activity. In some cases, procedures are done to close off or remove the veins. This information is not intended to replace advice given to you by your health care provider. Make sure you discuss any questions you have with your health care provider. Document Revised: 03/14/2020 Document Reviewed: 03/14/2020 Elsevier Patient Education  2021 [employer] Diabetic Neuropathy Diabetic neuropathy refers to nerve damage that is caused by diabetes. Over time, people with diabetes can develop nerve damage throughout the body. There are several types of diabetic neuropathy:  Peripheral neuropathy. This is the most common type of diabetic neuropathy. It damages the nerves that carry signals between the spinal  cord and other parts of the body (peripheral nerves). This usually affects nerves in the feet, legs, hands, and arms.  Autonomic neuropathy. This type causes damage to nerves that control involuntary functions (autonomic nerves). Involuntary functions are functions of the body that you do not control. They include heartbeat, body temperature, blood pressure, urination, digestion, sweating, sexual function, or response to changes in blood glucose.  Focal neuropathy. This type of nerve damage affects one area of the body, such as an arm, a leg, or the face. The injury may involve one nerve or a small group of nerves. Focal neuropathy can be painful and unpredictable. It occurs most often in older adults with diabetes. This often develops suddenly, but usually improves over time and does not cause long-term problems.  Proximal neuropathy. This type of nerve damage affects the nerves of the thighs, hips, buttocks, or legs. It causes severe pain, weakness, and muscle death (atrophy), usually in the thigh muscles. It is more common among older men and people who have type 2 diabetes. The length of recovery time may vary. What are the causes? Peripheral, autonomic, and focal neuropathies are caused by diabetes that is not well controlled with treatment. The cause of proximal neuropathy is not known, but it may be caused by inflammation related to uncontrolled blood glucose levels. What are the signs or symptoms? Peripheral neuropathy Peripheral neuropathy develops slowly over time. When the nerves of the feet and legs no longer work, you may experience:  Burning, stabbing, or aching pain in the legs or feet.  Pain or cramping in the legs or feet.  Loss of feeling (numbness) and inability to feel pressure or pain in the feet. This can lead to: ? Thick calluses or sores on areas of constant pressure. ? Ulcers. ? Reduced ability to feel temperature changes.  Foot deformities.  Muscle weakness.  Loss  of balance or coordination. Autonomic neuropathy The symptoms of autonomic neuropathy vary depending on which nerves are affected. Symptoms may include:  Problems with digestion, such as: ? Nausea or vomiting. ? Poor appetite. ? Bloating. ? Diarrhea or constipation. ? Trouble swallowing. ? Losing weight without trying to.  Problems with the heart, blood, and lungs, such as: ? Dizziness, especially when standing up. ? Fainting. ? Shortness of breath. ? Irregular heartbeat.  Bladder problems, such as: ? Trouble starting or stopping urination. ? Leaking urine. ? Trouble emptying the bladder. ? Urinary tract infections (UTIs).  Problems with other body functions, such as: ? Sweat. You  may sweat too much or too little. ? Temperature. You might get hot easily. Or, you might feel cold more than usual. ? Sexual function. Men may not be able to get or maintain an erection. Women may have vaginal dryness and difficulty with arousal. Focal neuropathy Symptoms affect only one area of the body. Common symptoms include:  Numbness.  Tingling.  Burning pain.  Prickling feeling.  Very sensitive skin.  Weakness.  Inability to move (paralysis).  Muscle twitching.  Muscles getting smaller (wasting).  Poor coordination.  Double or blurred vision. Proximal neuropathy  Sudden, severe pain in the hip, thigh, or buttocks. Pain may spread from the back into the legs (sciatica).  Pain and numbness in the arms and legs.  Tingling.  Loss of bladder control or bowel control.  Weakness and wasting of thigh muscles.  Difficulty getting up from a seated position.  Abdominal swelling.  Unexplained weight loss. How is this diagnosed? Diagnosis varies depending on the type of neuropathy your health care provider suspects. Peripheral neuropathy Your health care provider will do a neurologic exam. This exam checks your reflexes, how you move, and what you can feel. You may have  other tests, such as:  Blood tests.  Tests of the fluid that surrounds the spinal cord (lumbar puncture).  CT scan.  MRI.  Checking the nerves that control muscles (electromyogram, or EMG).  Checking how quickly signals pass through your nerves (nerve conduction study).  Checking a small piece of a nerve using a microscope (biopsy). Autonomic neuropathy You may have tests, such as:  Tests to measure your blood pressure and heart rate. You may be secured to an exam table that moves you from a lying position to an upright position (table tilt test).  Breathing tests to check your lungs.  Tests to check how food moves through the digestive system (gastric emptying tests).  Blood, sweat, or urine tests.  Ultrasound of your bladder.  Spinal fluid tests. Focal neuropathy This condition may be diagnosed with:  A neurologic exam.  CT scan.  MRI.  EMG.  Nerve conduction study. Proximal neuropathy There is no test to diagnose this type of neuropathy. You may have tests to rule out other possible causes of this type of neuropathy. Tests may include:  X-rays of your spine and lumbar region.  Lumbar puncture.  MRI. How is this treated? The goal of treatment is to keep nerve damage from getting worse. Treatment may include:  Following your diabetes management plan. This will help keep your blood glucose level and your A1C level within your target range. This is the most important treatment.  Using prescription pain medicine. Follow these instructions at home: Diabetes management Follow your diabetes management plan as told by your health care provider.  Check your blood glucose levels.  Keep your blood glucose in your target range.  Have your A1C level checked at least two times a year, or as often as told.  Take over the counter and prescription medicines only as told by your health care provider. This includes insulin and diabetes medicine.   Lifestyle  Do not  use any products that contain nicotine or tobacco, such as cigarettes, e-cigarettes, and chewing tobacco. If you need help quitting, ask your health care provider.  Be physically active every day. Include strength training and balance exercises.  Follow a healthy meal plan.  Work with your health care provider to manage your blood pressure.   General instructions  Ask your health care provider  if the medicine prescribed to you requires you to avoid driving or using machinery.  Check your skin and feet every day for cuts, bruises, redness, blisters, or sores.  Keep all follow-up visits. This is important. Contact a health care provider if:  You have burning, stabbing, or aching pain in your legs or feet.  You are unable to feel pressure or pain in your feet.  You develop problems with digestion, such as: ? Nausea. ? Vomiting. ? Bloating. ? Constipation. ? Diarrhea. ? Abdominal pain.  You have difficulty with urination, such as: ? Inability to control when you urinate (incontinence). ? Inability to completely empty the bladder (retention).  You feel as if your heart is racing (palpitations).  You feel dizzy, weak, or faint when you stand up. Get help right away if:  You cannot urinate.  You have sudden weakness or loss of coordination.  You have trouble speaking.  You have pain or pressure in your chest.  You have an irregular heartbeat.  You have sudden inability to move a part of your body. These symptoms may represent a serious problem that is an emergency. Do not wait to see if the symptoms will go away. Get medical help right away. Call your local emergency services (911 in the [place] Do not drive yourself to the hospital. Summary  Diabetic neuropathy is nerve damage that is caused by diabetes. It can cause numbness and pain in the arms, legs, digestive tract, heart, and other body systems.  This condition is treated by keeping your blood glucose level and your  A1C level within your target range. This can help prevent neuropathy from getting worse.  Check your skin and feet every day for cuts, bruises, redness, blisters, or sores.  Do not use any products that contain nicotine or tobacco, such as cigarettes, e-cigarettes, and chewing tobacco. This information is not intended to replace advice given to you by your health care provider. Make sure you discuss any questions you have with your health care provider. Document Revised: 03/14/2020 Document Reviewed: 03/14/2020 Elsevier Patient Education  2021 [employer].

## 2021-01-23 NOTE — Progress Notes (Signed)
°  ° ° °Established patient visit ° ° °Patient: Keith [name]   DOB: 09/26/1971   49 y.o. Male  MRN: [id number] °Visit Date: 01/23/2021 ° °Today's healthcare provider: Jennifer M Burnette, PA-C  ° °Chief Complaint  °Patient presents with  °• Leg Pain  ° °Subjective  °  °Leg Pain  °There was no injury mechanism (patient states that pain began 2 months ago or more). The pain is present in the right leg. The quality of the pain is described as aching, burning and shooting. The pain has been worsening since onset. Associated symptoms include muscle weakness and numbness. Pertinent negatives include no inability to bear weight, loss of motion, loss of sensation or tingling. He reports no foreign bodies present. The symptoms are aggravated by weight bearing. He has tried ice for the symptoms. The treatment provided no relief.  °  ° °Patient Active Problem List  ° Diagnosis Date Noted  °• Coronary artery disease of native artery of native heart with stable angina pectoris (HCC) 08/09/2019  °• Acute systolic heart failure (HCC) 07/11/2017  °• Unstable angina (HCC)   °• Other chest pain   °• Migraine aura, persistent, with status migrainosus   °• Hypertension associated with diabetes (HCC) 01/23/2015  °• Slurred speech 01/23/2015  °• Headache 01/23/2015  °• DM (diabetes mellitus) with complications (HCC) 01/23/2015  °• TIA (transient ischemic attack) 01/23/2015  ° °Past Medical History:  °Diagnosis Date  °• Chronic combined systolic (congestive) and diastolic (congestive) heart failure (HCC)   ° a. 06/2017 Echo: EF 35-40%, Gr1 DD; b. 02/2018 Echo: Ef 30-35%; c. 03/2018 Echo: EF 35-40%, mid-apicalanteroseptal, ant, and apical HK. Mildly dil LA. No LV thrombus.  °• Diabetes mellitus   °• Erectile dysfunction   °• Essential hypertension   °• Frequent headaches   °• NICM (nonischemic cardiomyopathy) (HCC)   ° a. 06/2017 Echo: EF 35-40%, Gr1 DD; b. 06/2017 Cath: nonobs dzs; c. 02/2018 Echo: Ef 30-35%; d. 03/2018 Echo: EF 35-40%,  mid-apicalanteroseptal, ant, and apical HK. Mildly dil LA. No LV thrombus.  °• Non-obstructive Coronary Artery Disease   ° a. 06/2017 Cath: LM nl, LAD nl, D1/2/3 nl, LCX 30p, OM1 nl, OM2 min irregs, OM3/4 nl, RCA 30p/m, RPL2/3 nl.  °• TIA (transient ischemic attack)   ° a. 02/2018 MRA w/o acute findings; b. 02/2018 Carotis U/S: no hemodynamically significant stenoses.  °• Tobacco abuse   ° °Allergies  °Allergen Reactions  °• Other Other (See Comments)  °  Muscle Relaxers (caused psychotic break) °Other reaction(s): Other (See Comments) °Muscle Relaxers (caused psychotic break)  ° °  °  °Medications: °Outpatient Medications Prior to Visit  °Medication Sig  °• allopurinol (ZYLOPRIM) 100 MG tablet Take 1 tablet (100 mg total) by mouth daily.  °• aspirin 81 MG chewable tablet Chew 1 tablet (81 mg total) by mouth daily.  °• atorvastatin (LIPITOR) 40 MG tablet TAKE 1 TABLET BY MOUTH ONCE DAILY AT  6PM  °• Blood Glucose Monitoring Suppl (ONETOUCH VERIO) w/Device KIT Use daily to check blood glucose  °• carvedilol (COREG) 12.5 MG tablet Take 1 tablet (12.5 mg total) by mouth 2 (two) times daily.  °• colchicine 0.6 MG tablet START WITH 2 TABLETS BY MOUTH THEN TAKE ONE TABLET EVERY 12 HOURS AS NEEDED FOR PAIN  °• dapagliflozin propanediol (FARXIGA) 10 MG TABS tablet Take 1 tablet (10 mg total) by mouth daily before breakfast.  °• Dulaglutide (TRULICITY) 0.75 MG/0.5ML SOPN Inject 0.75 mg into the skin once a week.  °•   week.   glucose blood (ONETOUCH VERIO) test strip Use daily to check blood glucose   Lancets (ONETOUCH ULTRASOFT) lancets Use daily to check blood glucose   metFORMIN (GLUCOPHAGE) 500 MG tablet Take 2 tablets by mouth twice daily   sacubitril-valsartan (ENTRESTO) 97-103 MG Take 1 tablet by mouth 2 (two) times daily.   [DISCONTINUED] furosemide (LASIX) 40 MG tablet Take 0.5 tablets (20 mg total) by mouth daily.   [DISCONTINUED] spironolactone (ALDACTONE) 25 MG tablet Take 1 tablet by mouth once daily   No  facility-administered medications prior to visit.    Review of Systems  Constitutional: Negative.   Respiratory: Negative.   Cardiovascular: Negative.   Musculoskeletal: Positive for arthralgias, gait problem and myalgias.  Neurological: Positive for numbness. Negative for tingling.    Last CBC Lab Results  Component Value Date   WBC 8.8 06/13/2020   HGB 15.2 06/13/2020   HCT 44.4 06/13/2020   MCV 91 06/13/2020   MCH 31.3 06/13/2020   RDW 12.6 06/13/2020   PLT 208 09/62/8366   Last metabolic panel Lab Results  Component Value Date   GLUCOSE 360 (H) 10/25/2020   NA 133 (L) 10/25/2020   K 4.0 10/25/2020   CL 91 (L) 10/25/2020   CO2 23 10/25/2020   BUN 24 10/25/2020   CREATININE 1.07 10/25/2020   GFRNONAA 81 10/25/2020   GFRAA 93 10/25/2020   CALCIUM 9.5 10/25/2020   PHOS 4.0 10/25/2020   PROT 7.2 06/13/2020   ALBUMIN 4.5 10/25/2020   LABGLOB 2.7 06/13/2020   AGRATIO 1.7 06/13/2020   BILITOT 1.1 06/13/2020   ALKPHOS 98 06/13/2020   AST 17 06/13/2020   ALT 17 06/13/2020   ANIONGAP 8 03/02/2018       Objective    BP (!) 160/97    Pulse 82    Temp 98.5 F (36.9 C) (Oral)    Resp 16    Wt 205 lb 4.8 oz (93.1 kg)    BMI 29.46 kg/m  BP Readings from Last 3 Encounters:  01/23/21 (!) 160/97  10/25/20 (!) 150/90  10/01/20 126/90   Wt Readings from Last 3 Encounters:  01/23/21 205 lb 4.8 oz (93.1 kg)  10/25/20 206 lb 8 oz (93.7 kg)  10/01/20 210 lb 12.8 oz (95.6 kg)       Physical Exam Vitals reviewed.  Constitutional:      General: He is not in acute distress.    Appearance: Normal appearance. He is well-developed. He is not ill-appearing.  HENT:     Head: Normocephalic and atraumatic.  Eyes:     Conjunctiva/sclera: Conjunctivae normal.  Cardiovascular:     Pulses: Normal pulses.  Pulmonary:     Effort: Pulmonary effort is normal. No respiratory distress.  Musculoskeletal:     Cervical back: Normal range of motion and neck supple.     Right lower  leg: No edema.     Left lower leg: No edema.  Skin:    General: Skin is warm and dry.     Capillary Refill: Capillary refill takes less than 2 seconds.  Neurological:     General: No focal deficit present.     Mental Status: He is alert. Mental status is at baseline.  Psychiatric:        Mood and Affect: Mood normal.        Behavior: Behavior normal.        Thought Content: Thought content normal.        Judgment: Judgment normal.  results found for any visits on 01/23/21. ° Assessment & Plan  °  ° °1. HFrEF (heart failure with reduced ejection fraction) (HCC) °Stable. Continue current medical treatment. Followed by Cardiology.  ° °2. BMI 29.0-29.9,adult °Counseled patient on healthy lifestyle modifications including dieting and exercise.  ° °3. DM (diabetes mellitus) with complications (HCC) °Stable. Continue current medical treatment.  ° °4. Coronary artery disease of native artery of native heart with stable angina pectoris (HCC) °Stable. Followed by Cardiology. ° °5. Type 2 diabetes mellitus with diabetic neuropathy, without long-term current use of insulin (HCC) °Suspect pain and tingling he is having in his lower extremities is progressive neuropathy due to previously uncontrolled diabetes. He is doing much better now. Will start Gabapentin as below at bedtime. Call if not improving. °- gabapentin (NEURONTIN) 300 MG capsule; Take 1 capsule (300 mg total) by mouth at bedtime.  Dispense: 90 capsule; Refill: 1 °- Ambulatory referral to Vascular Surgery ° °6. Varicose veins of bilateral lower extremities with pain °Patient does also have multiple large, ropey varicosities with the right leg being worse than the left. I do feel this may contribute as well to the dull aching and swelling that he gets. Referral placed to vascular surgery as below for further evaluation and treatment considerations. Consult appreciated. Also have advised him to wear compression stockings, particularly while at  work.  °- Ambulatory referral to Vascular Surgery ° ° °Return in about 3 months (around 04/25/2021), or if symptoms worsen or fail to improve, for t2dm.  °   ° °I, Jennifer M Burnette, PA-C, have reviewed all documentation for this visit. The documentation on 02/02/21 for the exam, diagnosis, procedures, and orders are all accurate and complete. ° ° °Jennifer M Burnette, PA-C  °[facility] °[phone] (phone) °[phone] (fax) ° ° Medical Group °

## 2021-01-24 ENCOUNTER — Ambulatory Visit: Payer: Self-pay | Admitting: Physician Assistant

## 2021-01-26 ENCOUNTER — Other Ambulatory Visit: Payer: Self-pay | Admitting: Physician Assistant

## 2021-01-26 DIAGNOSIS — I152 Hypertension secondary to endocrine disorders: Secondary | ICD-10-CM

## 2021-01-26 DIAGNOSIS — E1159 Type 2 diabetes mellitus with other circulatory complications: Secondary | ICD-10-CM

## 2021-01-26 DIAGNOSIS — I5021 Acute systolic (congestive) heart failure: Secondary | ICD-10-CM

## 2021-01-26 NOTE — Telephone Encounter (Signed)
Requested Prescriptions  Pending Prescriptions Disp Refills  . spironolactone (ALDACTONE) 25 MG tablet [Pharmacy Med Name: Spironolactone 25 MG Oral Tablet] 90 tablet 0    Sig: Take 1 tablet by mouth once daily     Cardiovascular: Diuretics - Aldosterone Antagonist Failed - 01/26/2021  6:30 AM      Failed - Na in normal range and within 360 days    Sodium  Date Value Ref Range Status  10/25/2020 133 (L) 134 - 144 mmol/L Final         Failed - Last BP in normal range    BP Readings from Last 1 Encounters:  01/23/21 (!) 160/97         Passed - Cr in normal range and within 360 days    Creat  Date Value Ref Range Status  12/14/2016 0.72 0.60 - 1.35 mg/dL Final   Creatinine, Ser  Date Value Ref Range Status  10/25/2020 1.07 0.76 - 1.27 mg/dL Final         Passed - K in normal range and within 360 days    Potassium  Date Value Ref Range Status  10/25/2020 4.0 3.5 - 5.2 mmol/L Final         Passed - Valid encounter within last 6 months    Recent Outpatient Visits          3 days ago BMI 29.0-29.9,adult   [facility] [place], [place], [place]   3 months ago Acute systolic heart failure [facility])   [facility] [place], [place], [place]   4 months ago Acute right ankle pain   [facility] [place], [place], [place]   7 months ago Acute right ankle pain   [facility] [place], [place], [place]   1 year ago Chest pain, unspecified type   [facility] [place], [place], [place]      Future Appointments            In 2 months Gollan, Tollie Pizza, MD [facility], [facility]

## 2021-01-27 ENCOUNTER — Other Ambulatory Visit: Payer: Self-pay | Admitting: Physician Assistant

## 2021-01-27 DIAGNOSIS — I5020 Unspecified systolic (congestive) heart failure: Secondary | ICD-10-CM

## 2021-01-27 MED ORDER — FUROSEMIDE 40 MG PO TABS: 40.0000 mg | ORAL_TABLET | Freq: Every day | ORAL | 0 refills | Status: DC

## 2021-01-27 NOTE — Telephone Encounter (Signed)
Requested Prescriptions  Pending Prescriptions Disp Refills  . furosemide (LASIX) 40 MG tablet [Pharmacy Med Name: Furosemide 40 MG Oral Tablet] 90 tablet 0    Sig: Take 1 tablet by mouth once daily     Cardiovascular:  Diuretics - Loop Failed - 01/27/2021  5:30 AM      Failed - Na in normal range and within 360 days    Sodium  Date Value Ref Range Status  10/25/2020 133 (L) 134 - 144 mmol/L Final         Failed - Last BP in normal range    BP Readings from Last 1 Encounters:  01/23/21 (!) 160/97         Passed - K in normal range and within 360 days    Potassium  Date Value Ref Range Status  10/25/2020 4.0 3.5 - 5.2 mmol/L Final         Passed - Ca in normal range and within 360 days    Calcium  Date Value Ref Range Status  10/25/2020 9.5 8.7 - 10.2 mg/dL Final   Calcium, Ion  Date Value Ref Range Status  01/23/2015 1.07 (L) 1.12 - 1.23 mmol/L Final         Passed - Cr in normal range and within 360 days    Creat  Date Value Ref Range Status  12/14/2016 0.72 0.60 - 1.35 mg/dL Final   Creatinine, Ser  Date Value Ref Range Status  10/25/2020 1.07 0.76 - 1.27 mg/dL Final         Passed - Valid encounter within last 6 months    Recent Outpatient Visits          4 days ago BMI 29.0-29.9,adult   [facility] [place], [place], [place]   3 months ago Acute systolic heart failure [facility])   [facility] [place], [place], [place]   4 months ago Acute right ankle pain   [facility] [place], [place]   7 months ago Acute right ankle pain   [facility] [place], [place], [place]   1 year ago Chest pain, unspecified type   [facility] [place], [place], [place]      Future Appointments            In 2 months Gollan, Tollie Pizza, MD [facility], [facility]

## 2021-01-27 NOTE — Addendum Note (Signed)
Addended by: Margaretann Loveless on: 01/27/2021 01:32 PM   Modules accepted: Orders

## 2021-02-02 ENCOUNTER — Encounter: Payer: Self-pay | Admitting: Physician Assistant

## 2021-03-18 ENCOUNTER — Encounter: Payer: Self-pay | Admitting: Emergency Medicine

## 2021-03-18 ENCOUNTER — Other Ambulatory Visit: Payer: Self-pay

## 2021-03-18 ENCOUNTER — Emergency Department
Admission: EM | Admit: 2021-03-18 | Discharge: 2021-03-18 | Disposition: A | Discharge status: Home / Self Care | Payer: Managed Care, Other (non HMO) | Attending: Emergency Medicine | Admitting: Emergency Medicine

## 2021-03-18 ENCOUNTER — Emergency Department: Payer: Managed Care, Other (non HMO)

## 2021-03-18 DIAGNOSIS — Z79899 Other long term (current) drug therapy: Secondary | ICD-10-CM | POA: Diagnosis not present

## 2021-03-18 DIAGNOSIS — M25571 Pain in right ankle and joints of right foot: Secondary | ICD-10-CM | POA: Diagnosis present

## 2021-03-18 DIAGNOSIS — F17210 Nicotine dependence, cigarettes, uncomplicated: Secondary | ICD-10-CM | POA: Diagnosis not present

## 2021-03-18 DIAGNOSIS — Z7984 Long term (current) use of oral hypoglycemic drugs: Secondary | ICD-10-CM | POA: Insufficient documentation

## 2021-03-18 DIAGNOSIS — Z7982 Long term (current) use of aspirin: Secondary | ICD-10-CM | POA: Insufficient documentation

## 2021-03-18 DIAGNOSIS — I110 Hypertensive heart disease with heart failure: Secondary | ICD-10-CM | POA: Insufficient documentation

## 2021-03-18 DIAGNOSIS — M109 Gout, unspecified: Secondary | ICD-10-CM

## 2021-03-18 DIAGNOSIS — E119 Type 2 diabetes mellitus without complications: Secondary | ICD-10-CM | POA: Insufficient documentation

## 2021-03-18 DIAGNOSIS — M25471 Effusion, right ankle: Secondary | ICD-10-CM

## 2021-03-18 DIAGNOSIS — I2510 Atherosclerotic heart disease of native coronary artery without angina pectoris: Secondary | ICD-10-CM | POA: Insufficient documentation

## 2021-03-18 DIAGNOSIS — I5042 Chronic combined systolic (congestive) and diastolic (congestive) heart failure: Secondary | ICD-10-CM | POA: Diagnosis not present

## 2021-03-18 LAB — BASIC METABOLIC PANEL
Anion gap: 12 (ref 5–15)
BUN: 17 mg/dL (ref 6–20)
CO2: 27 mmol/L (ref 22–32)
Calcium: 9.6 mg/dL (ref 8.9–10.3)
Chloride: 99 mmol/L (ref 98–111)
Creatinine, Ser: 0.79 mg/dL (ref 0.61–1.24)
GFR, Estimated: >60 mL/min (ref >60)
Glucose, Bld: 154 mg/dL — ABNORMAL HIGH (ref 70–99)
Potassium: 3.7 mmol/L (ref 3.5–5.1)
Sodium: 138 mmol/L (ref 135–145)

## 2021-03-18 LAB — CBC
HCT: 44.6 % (ref 39.0–52.0)
Hemoglobin: 15.1 g/dL (ref 13.0–17.0)
MCH: 28.8 pg (ref 26.0–34.0)
MCHC: 33.9 g/dL (ref 30.0–36.0)
MCV: 85 fL (ref 80.0–100.0)
Platelets: 193 10*3/uL (ref 150–400)
RBC: 5.25 MIL/uL (ref 4.22–5.81)
RDW: 12.9 % (ref 11.5–15.5)
WBC: 8.8 10*3/uL (ref 4.0–10.5)
nRBC: 0 % (ref 0.0–0.2)

## 2021-03-18 MED ORDER — IBUPROFEN 800 MG PO TABS: 800.0000 mg | ORAL_TABLET | Freq: Three times a day (TID) | ORAL | 0 refills | Status: AC | PRN

## 2021-03-18 MED ORDER — COLCHICINE 0.6 MG PO TABS
1.8000 mg | ORAL_TABLET | Freq: Once | ORAL | Status: AC
  Administered 2021-03-18: 1.8 mg via ORAL
  Filled 2021-03-18: qty 3

## 2021-03-18 MED ORDER — COLCHICINE 0.6 MG PO TABS: 0.6000 mg | ORAL_TABLET | Freq: Every day | ORAL | 11 refills | Status: DC

## 2021-03-18 MED ORDER — HYDROCODONE-ACETAMINOPHEN 5-325 MG PO TABS
1.0000 | ORAL_TABLET | Freq: Once | ORAL | Status: AC
Start: 2021-03-18 — End: 2021-03-18
  Administered 2021-03-18: 1 via ORAL
  Filled 2021-03-18: qty 1

## 2021-03-18 NOTE — ED Provider Notes (Signed)
[facility] Emergency Department Provider Note   ____________________________________________   Event Date/Time   First MD Initiated Contact with Patient 03/18/21 2024     (approximate)  I have reviewed the triage vital signs and the nursing notes.   HISTORY  Chief Complaint Leg Swelling    HPI Keith [name] is a 50 y.o. male with the below stated past medical history presents for right ankle pain that has been intermittent over the last year and acutely worsened over the past few days with associated erythema to the lateral aspect and swelling along the ankle joint as well as distally.  Patient describes a sharp, burning pain that radiates down his foot from the ankle that is worse with palpation over the lateral ankle as well as with movement at the joint.  Of note, patient states that this pain actually improves after walking but does not fully go away.  Patient denies any other exacerbating or relieving factors.  Patient currently denies any vision changes, tinnitus, difficulty speaking, facial droop, sore throat, chest pain, shortness of breath, abdominal pain, nausea/vomiting/diarrhea, dysuria         Past Medical History:  Diagnosis Date  . Chronic combined systolic (congestive) and diastolic (congestive) heart failure ([place])    a. 06/2017 Echo: EF 35-40%, Gr1 DD; b. 02/2018 Echo: Ef 30-35%; c. 03/2018 Echo: EF 35-40%, mid-apicalanteroseptal, ant, and apical HK. Mildly dil LA. No LV thrombus.  . Diabetes mellitus   . Erectile dysfunction   . Essential hypertension   . Frequent headaches   . NICM (nonischemic cardiomyopathy) ([place])    a. 06/2017 Echo: EF 35-40%, Gr1 DD; b. 06/2017 Cath: nonobs dzs; c. 02/2018 Echo: Ef 30-35%; d. 03/2018 Echo: EF 35-40%, mid-apicalanteroseptal, ant, and apical HK. Mildly dil LA. No LV thrombus.  . Non-obstructive Coronary Artery Disease    a. 06/2017 Cath: LM nl, LAD nl, D1/2/3 nl, LCX 30p, OM1 nl, OM2 min irregs, OM3/4  nl, RCA 30p/m, RPL2/3 nl.  . TIA (transient ischemic attack)    a. 02/2018 MRA w/o acute findings; b. 02/2018 Carotis U/S: no hemodynamically significant stenoses.  . Tobacco abuse     Patient Active Problem List   Diagnosis Date Noted  . Coronary artery disease of native artery of native heart with stable angina pectoris ([place]) 08/09/2019  . Acute systolic heart failure ([place]) 07/11/2017  . Unstable angina ([place])   . Other chest pain   . Migraine aura, persistent, with status migrainosus   . Hypertension associated with diabetes ([place]) 01/23/2015  . Slurred speech 01/23/2015  . Headache 01/23/2015  . DM (diabetes mellitus) with complications ([place]) 62/70/3500  . TIA (transient ischemic attack) 01/23/2015    Past Surgical History:  Procedure Laterality Date  . COLONOSCOPY WITH PROPOFOL N/A 02/19/2017   Procedure: COLONOSCOPY WITH PROPOFOL;  Surgeon: Jonathon Bellows, MD;  Location: [facility] ENDOSCOPY;  Service: Endoscopy;  Laterality: N/A;  . ESOPHAGOGASTRODUODENOSCOPY (EGD) WITH PROPOFOL N/A 02/19/2017   Procedure: ESOPHAGOGASTRODUODENOSCOPY (EGD) WITH PROPOFOL;  Surgeon: Jonathon Bellows, MD;  Location: [facility] ENDOSCOPY;  Service: Endoscopy;  Laterality: N/A;  . LEFT HEART CATH AND CORONARY ANGIOGRAPHY N/A 07/11/2017   Procedure: LEFT HEART CATH AND CORONARY ANGIOGRAPHY;  Surgeon: Wellington Hampshire, MD;  Location: [facility] [facility];  Service: Cardiovascular;  Laterality: N/A;    Prior to Admission medications   Medication Sig Start Date End Date Taking? Authorizing Provider  colchicine 0.6 MG tablet Take 1 tablet (0.6 mg total) by mouth daily. 03/18/21 03/18/22 Yes  Naaman Plummer, MD  ibuprofen (ADVIL) 800 MG tablet Take 1 tablet (800 mg total) by mouth every 8 (eight) hours as needed for up to 5 days. 03/18/21 03/23/21 Yes Naaman Plummer, MD  allopurinol (ZYLOPRIM) 100 MG tablet Take 1 tablet (100 mg total) by mouth daily. 09/04/20   Mar Daring, PA-C  aspirin 81 MG chewable tablet Chew 1 tablet (81 mg  total) by mouth daily. 08/09/19   Trinna Post, PA-C  atorvastatin (LIPITOR) 40 MG tablet TAKE 1 TABLET BY MOUTH ONCE DAILY AT  6PM 11/18/20   Trinna Post, PA-C  Blood Glucose Monitoring Suppl [facility] VERIO) w/Device KIT Use daily to check blood glucose 10/25/20   Mar Daring, PA-C  carvedilol (COREG) 12.5 MG tablet Take 1 tablet (12.5 mg total) by mouth 2 (two) times daily. 10/01/20 09/26/21  Loel Dubonnet, NP  colchicine 0.6 MG tablet START WITH 2 TABLETS BY MOUTH THEN TAKE ONE TABLET EVERY 12 HOURS AS NEEDED FOR PAIN 09/04/20   Mar Daring, PA-C  dapagliflozin propanediol (FARXIGA) 10 MG TABS tablet Take 1 tablet (10 mg total) by mouth daily before breakfast. 06/14/20   Mar Daring, PA-C  Dulaglutide (TRULICITY) 2.99 ME/2.6ST SOPN Inject 0.75 mg into the skin once a week. 10/29/20   Mar Daring, PA-C  furosemide (LASIX) 40 MG tablet Take 1 tablet (40 mg total) by mouth daily. 01/27/21   Mar Daring, PA-C  gabapentin (NEURONTIN) 300 MG capsule Take 1 capsule (300 mg total) by mouth at bedtime. 01/23/21   Mar Daring, PA-C  glucose blood (ONETOUCH VERIO) test strip Use daily to check blood glucose 10/25/20   Fenton Malling M, PA-C  Lancets [facility] ULTRASOFT) lancets Use daily to check blood glucose 10/25/20   Fenton Malling M, PA-C  metFORMIN (GLUCOPHAGE) 500 MG tablet Take 2 tablets by mouth twice daily 01/06/21   Trinna Post, PA-C  sacubitril-valsartan (ENTRESTO) 97-103 MG Take 1 tablet by mouth 2 (two) times daily. 10/14/20   Minna Merritts, MD  spironolactone (ALDACTONE) 25 MG tablet Take 1 tablet by mouth once daily 01/26/21   Mar Daring, PA-C    Allergies Other  Family History  Problem Relation Age of Onset  . Hypertension Mother   . Diabetes Mother   . Hypertension Father   . Diabetes Father     Social History Social History   Tobacco Use  . Smoking status: Current Every Day Smoker     Packs/day: 1.00    Types: Cigarettes  . Smokeless tobacco: Never Used  Vaping Use  . Vaping Use: Never used  Substance Use Topics  . Alcohol use: Yes    Comment: Occasionally   . Drug use: No    Review of Systems Constitutional: No fever/chills Eyes: No visual changes. ENT: No sore throat. Cardiovascular: Denies chest pain. Respiratory: Denies shortness of breath. Gastrointestinal: No abdominal pain.  No nausea, no vomiting.  No diarrhea. Genitourinary: Negative for dysuria. Musculoskeletal: Positive for acute right ankle pain Skin: Negative for rash. Neurological: Negative for headaches, weakness/numbness/paresthesias in any extremity Psychiatric: Negative for suicidal ideation/homicidal ideation   ____________________________________________   PHYSICAL EXAM:  VITAL SIGNS: ED Triage Vitals  Enc Vitals Group     BP 03/18/21 1950 (!) 169/104     Pulse Rate 03/18/21 1950 92     Resp 03/18/21 1950 18     Temp 03/18/21 1950 98.6 F (37 C)     Temp Source 03/18/21 1950  Oral     SpO2 03/18/21 1950 96 %     Weight 03/18/21 1943 210 lb (95.3 kg)     Height 03/18/21 1943 _0  (1.803 m)     Head Circumference --      Peak Flow --      Pain Score 03/18/21 1938 7     Pain Loc --      Pain Edu? --      Excl. in [place]? --    Constitutional: Alert and oriented. Well appearing and in no acute distress. Eyes: Conjunctivae are normal. PERRL. Head: Atraumatic. Nose: No congestion/rhinnorhea. Mouth/Throat: Mucous membranes are moist. Neck: No stridor Cardiovascular: Grossly normal heart sounds.  Good peripheral circulation. Respiratory: Normal respiratory effort.  No retractions. Gastrointestinal: Soft and nontender. No distention. Musculoskeletal: Right ankle swollen with erythema into the lateral aspect and edema distally into the toes.  Patient is exquisitely tender over the lateral ankle joint and with any range of motion at the right ankle Neurologic:  Normal speech and  language. No gross focal neurologic deficits are appreciated. Skin:  Skin is warm and dry. No rash noted. Psychiatric: Mood and affect are normal. Speech and behavior are normal.  ____________________________________________   LABS (all labs ordered are listed, but only abnormal results are displayed)  Labs Reviewed  BASIC METABOLIC PANEL - Abnormal; Notable for the following components:      Result Value   Glucose, Bld 154 (*)    All other components within normal limits  CBC   RADIOLOGY  ED MD interpretation: Three-view x-ray of the right foot shows no evidence of acute abnormalities  Official radiology report(s): DG Foot Complete Right  Result Date: 03/18/2021 CLINICAL DATA:  Right leg and foot swelling EXAM: RIGHT FOOT COMPLETE - 3+ VIEW COMPARISON:  None. FINDINGS: There is no evidence of fracture or dislocation. There is no evidence of arthropathy or other focal bone abnormality. Soft tissues are unremarkable. IMPRESSION: Negative. Electronically Signed   By: Ulyses Jarred M.D.   On: 03/18/2021 20:35    ____________________________________________   PROCEDURES  Procedure(s) performed (including Critical Care):  Procedures   ____________________________________________   INITIAL IMPRESSION / ASSESSMENT AND PLAN / ED COURSE  As part of my medical decision making, I reviewed the following data within the [place] notes reviewed and incorporated, Old chart reviewed, Radiograph reviewed and Notes from prior ED visits reviewed and incorporated     50 year old male presents for right ankle pain with erythema, pain with range of motion, sharp burning Given history, exam and workup I have low suspicion for fracture, dislocation, significant ligamentous injury, septic arthritis, new autoimmune arthropathy, or gonococcal arthropathy.  Interventions: Colchicine  Disposition: Discharge home with strict return precautions and instructions for prompt  primary care follow up in the next week.        ____________________________________________   FINAL CLINICAL IMPRESSION(S) / ED DIAGNOSES  Final diagnoses:  Acute right ankle pain  Right ankle swelling  Acute gout of left ankle, unspecified cause     ED Discharge Orders         Ordered    colchicine 0.6 MG tablet  Daily        03/18/21 2145    ibuprofen (ADVIL) 800 MG tablet  Every 8 hours PRN        03/18/21 2145           Note:  This document was prepared using Dragon voice recognition software and may include unintentional  dictation errors.   Naaman Plummer, MD 03/18/21 [phone]

## 2021-03-18 NOTE — ED Notes (Signed)
Pt given crutches and right foot wraped in ace wrap.

## 2021-03-18 NOTE — ED Notes (Signed)
Pt given crutches and ace wrap taken to POV. VSS. NAD. All questions and concerns addressed.

## 2021-03-18 NOTE — ED Triage Notes (Addendum)
Pt arrived via POV with reports of R leg and foot swelling that has been chronic, but states it has worsened over the past week or so. Pt states he has an appt Monday with provider that has been following him for his swelling.  Pt states the pain in foot comes and goes and depends on if he has his foot up or not. Pt states last night he noticed the veins in his leg protruding.  Pt states he gets pain in his heel and states he feels like his foot could snap in half.   Pt states he does currently take Gabapentin at night.

## 2021-03-20 ENCOUNTER — Ambulatory Visit: Payer: Managed Care, Other (non HMO) | Admitting: Family Medicine

## 2021-03-24 ENCOUNTER — Encounter: Payer: Self-pay | Admitting: Family Medicine

## 2021-03-24 ENCOUNTER — Other Ambulatory Visit: Payer: Self-pay

## 2021-03-24 ENCOUNTER — Ambulatory Visit (INDEPENDENT_AMBULATORY_CARE_PROVIDER_SITE_OTHER): Payer: Managed Care, Other (non HMO) | Admitting: Family Medicine

## 2021-03-24 VITALS — BP 156/89 | HR 85 | Temp 98.8°F | Wt 205.0 lb

## 2021-03-24 DIAGNOSIS — E118 Type 2 diabetes mellitus with unspecified complications: Secondary | ICD-10-CM | POA: Diagnosis not present

## 2021-03-24 DIAGNOSIS — I5020 Unspecified systolic (congestive) heart failure: Secondary | ICD-10-CM | POA: Diagnosis not present

## 2021-03-24 DIAGNOSIS — M79671 Pain in right foot: Secondary | ICD-10-CM | POA: Diagnosis not present

## 2021-03-24 NOTE — Progress Notes (Signed)
Acute Office Visit  Subjective:    Patient ID: Keith [name], male    DOB: July 31, 1971, 50 y.o.   MRN: [id number]  No chief complaint on file.   HPI Patient is in today for right foot pain.Patient was seen in the ER on 03/18/21.  He has also had episodes in the past.  He did not have his uric acid checked in May which was then normal.   Past Medical History:  Diagnosis Date  . Chronic combined systolic (congestive) and diastolic (congestive) heart failure ([place])    a. 06/2017 Echo: EF 35-40%, Gr1 DD; b. 02/2018 Echo: Ef 30-35%; c. 03/2018 Echo: EF 35-40%, mid-apicalanteroseptal, ant, and apical HK. Mildly dil LA. No LV thrombus.  . Diabetes mellitus   . Erectile dysfunction   . Essential hypertension   . Frequent headaches   . NICM (nonischemic cardiomyopathy) ([place])    a. 06/2017 Echo: EF 35-40%, Gr1 DD; b. 06/2017 Cath: nonobs dzs; c. 02/2018 Echo: Ef 30-35%; d. 03/2018 Echo: EF 35-40%, mid-apicalanteroseptal, ant, and apical HK. Mildly dil LA. No LV thrombus.  . Non-obstructive Coronary Artery Disease    a. 06/2017 Cath: LM nl, LAD nl, D1/2/3 nl, LCX 30p, OM1 nl, OM2 min irregs, OM3/4 nl, RCA 30p/m, RPL2/3 nl.  . TIA (transient ischemic attack)    a. 02/2018 MRA w/o acute findings; b. 02/2018 Carotis U/S: no hemodynamically significant stenoses.  . Tobacco abuse     Past Surgical History:  Procedure Laterality Date  . COLONOSCOPY WITH PROPOFOL N/A 02/19/2017   Procedure: COLONOSCOPY WITH PROPOFOL;  Surgeon: Jonathon Bellows, MD;  Location: [facility];  Service: Endoscopy;  Laterality: N/A;  . ESOPHAGOGASTRODUODENOSCOPY (EGD) WITH PROPOFOL N/A 02/19/2017   Procedure: ESOPHAGOGASTRODUODENOSCOPY (EGD) WITH PROPOFOL;  Surgeon: Jonathon Bellows, MD;  Location: [facility]OSCOPY;  Service: Endoscopy;  Laterality: N/A;  . LEFT HEART CATH AND CORONARY ANGIOGRAPHY N/A 07/11/2017   Procedure: LEFT HEART CATH AND CORONARY ANGIOGRAPHY;  Surgeon: Wellington Hampshire, MD;  Location: [facility];  Service:  Cardiovascular;  Laterality: N/A;    Family History  Problem Relation Age of Onset  . Hypertension Mother   . Diabetes Mother   . Hypertension Father   . Diabetes Father     Social History   Socioeconomic History  . Marital status: Married    Spouse name: Not on file  . Number of children: Not on file  . Years of education: Not on file  . Highest education level: Not on file  Occupational History  . Not on file  Tobacco Use  . Smoking status: Current Every Day Smoker    Packs/day: 1.00    Types: Cigarettes  . Smokeless tobacco: Never Used  Vaping Use  . Vaping Use: Never used  Substance and Sexual Activity  . Alcohol use: Yes    Comment: Occasionally   . Drug use: No  . Sexual activity: Yes  Other Topics Concern  . Not on file  Social History Narrative  . Not on file   Social Determinants of Health   Financial Resource Strain: Not on file  Food Insecurity: Not on file  Transportation Needs: Not on file  Physical Activity: Not on file  Stress: Not on file  Social Connections: Not on file  Intimate Partner Violence: Not on file    Outpatient Medications Prior to Visit  Medication Sig Dispense Refill  . allopurinol (ZYLOPRIM) 100 MG tablet Take 1 tablet (100 mg total) by mouth daily. 90 tablet 3  .  aspirin 81 MG chewable tablet Chew 1 tablet (81 mg total) by mouth daily. 30 tablet 2  . atorvastatin (LIPITOR) 40 MG tablet TAKE 1 TABLET BY MOUTH ONCE DAILY AT  6PM 90 tablet 0  . Blood Glucose Monitoring Suppl (ONETOUCH VERIO) w/Device KIT Use daily to check blood glucose 1 kit 0  . carvedilol (COREG) 12.5 MG tablet Take 1 tablet (12.5 mg total) by mouth 2 (two) times daily. 180 tablet 3  . colchicine 0.6 MG tablet Take 1 tablet (0.6 mg total) by mouth daily. 30 tablet 11  . dapagliflozin propanediol (FARXIGA) 10 MG TABS tablet Take 1 tablet (10 mg total) by mouth daily before breakfast. 90 tablet 1  . Dulaglutide (TRULICITY) 3.00 PQ/3.3AQ SOPN Inject 0.75 mg into  the skin once a week. 6 mL 3  . furosemide (LASIX) 40 MG tablet Take 1 tablet (40 mg total) by mouth daily. 90 tablet 0  . gabapentin (NEURONTIN) 300 MG capsule Take 1 capsule (300 mg total) by mouth at bedtime. 90 capsule 1  . glucose blood (ONETOUCH VERIO) test strip Use daily to check blood glucose 100 each 12  . Lancets (ONETOUCH ULTRASOFT) lancets Use daily to check blood glucose 100 each 12  . metFORMIN (GLUCOPHAGE) 500 MG tablet Take 2 tablets by mouth twice daily 360 tablet 0  . sacubitril-valsartan (ENTRESTO) 97-103 MG Take 1 tablet by mouth 2 (two) times daily. 180 tablet 2  . spironolactone (ALDACTONE) 25 MG tablet Take 1 tablet by mouth once daily 90 tablet 0  . colchicine 0.6 MG tablet START WITH 2 TABLETS BY MOUTH THEN TAKE ONE TABLET EVERY 12 HOURS AS NEEDED FOR PAIN 90 tablet 1   No facility-administered medications prior to visit.    Allergies  Allergen Reactions  . Other Other (See Comments)    Muscle Relaxers (caused psychotic break) Other reaction(s): Other (See Comments) Muscle Relaxers (caused psychotic break)    Review of Systems  Skin: Positive for color change.       Swelling       Objective:    Physical Exam Constitutional:      General: He is not in acute distress.    Appearance: He is well-developed.  HENT:     Head: Normocephalic and atraumatic.     Right Ear: Hearing normal.     Left Ear: Hearing normal.     Nose: Nose normal.  Eyes:     General: Lids are normal. No scleral icterus.       Right eye: No discharge.        Left eye: No discharge.     Conjunctiva/sclera: Conjunctivae normal.  Cardiovascular:     Rate and Rhythm: Normal rate and regular rhythm.     Heart sounds: Normal heart sounds.  Pulmonary:     Effort: Pulmonary effort is normal. No respiratory distress.     Breath sounds: Normal breath sounds.  Abdominal:     General: Bowel sounds are normal.     Palpations: Abdomen is soft.  Musculoskeletal:        General: Swelling  and tenderness present. Normal range of motion.     Comments: Some redness of the right ankle with some swelling. No heat but very tender.  Skin:    Findings: No lesion or rash.  Neurological:     Mental Status: He is alert and oriented to person, place, and time.  Psychiatric:        Speech: Speech normal.  Behavior: Behavior normal.        Thought Content: Thought content normal.    Diabetic Foot Form - Detailed   Diabetic Foot Exam - detailed Diabetic Foot exam was performed with the following findings: Yes 03/24/2021  9:38 AM  Visual Foot Exam completed.: Yes  Can the patient see the bottom of their feet?: Yes Are the shoes appropriate in style and fit?: No Is there swelling or and abnormal foot shape?: No Is there a claw toe deformity?: No Is there elevated skin temparature?: No Is there foot or ankle muscle weakness?: No Normal Range of Motion: Yes Pulse Foot Exam completed.: Yes  Right posterior Tibialias: Present Left posterior Tibialias: Present  Right Dorsalis Pedis: Present Left Dorsalis Pedis: Present  Semmes-Weinstein Monofilament Test R Site 1-Great Toe: Pos L Site 1-Great Toe: Pos    Comments: Burning sensation top of both feet. No ulcerations or open sores.      BP (!) 156/89 (BP Location: Right Arm, Patient Position: Sitting, Cuff Size: Normal)   Pulse 85   Temp 98.8 F (37.1 C) (Oral)   Wt 205 lb (93 kg)   SpO2 98%   BMI 28.59 kg/m  Wt Readings from Last 3 Encounters:  03/24/21 205 lb (93 kg)  03/18/21 210 lb (95.3 kg)  01/23/21 205 lb 4.8 oz (93.1 kg)    Health Maintenance Due  Topic Date Due  . PNEUMOCOCCAL POLYSACCHARIDE VACCINE AGE 25-64 HIGH RISK  Never done  . Hepatitis C Screening  Never done  . FOOT EXAM  02/12/2019  . OPHTHALMOLOGY EXAM  04/02/2019  . COVID-19 Vaccine (3 - Booster for Pfizer series) 09/26/2020    There are no preventive care reminders to display for this patient.   Lab Results  Component Value Date   TSH  3.644 03/03/2018   Lab Results  Component Value Date   WBC 8.8 03/18/2021   HGB 15.1 03/18/2021   HCT 44.6 03/18/2021   MCV 85.0 03/18/2021   PLT 193 03/18/2021   Lab Results  Component Value Date   NA 138 03/18/2021   K 3.7 03/18/2021   CO2 27 03/18/2021   GLUCOSE 154 (H) 03/18/2021   BUN 17 03/18/2021   CREATININE 0.79 03/18/2021   BILITOT 1.1 06/13/2020   ALKPHOS 98 06/13/2020   AST 17 06/13/2020   ALT 17 06/13/2020   PROT 7.2 06/13/2020   ALBUMIN 4.5 10/25/2020   CALCIUM 9.6 03/18/2021   ANIONGAP 12 03/18/2021   Lab Results  Component Value Date   CHOL 171 08/09/2019   Lab Results  Component Value Date   HDL 32 (L) 08/09/2019   Lab Results  Component Value Date   LDLCALC 96 08/09/2019   Lab Results  Component Value Date   TRIG 255 (H) 08/09/2019   Lab Results  Component Value Date   CHOLHDL 5.3 (H) 08/09/2019   Lab Results  Component Value Date   HGBA1C 11.3 (H) 10/25/2020       Assessment & Plan:   1. Acute foot pain, right History of gout and feeling better since restarting Colchiicine. Still taking Allopurinol 100 mg qd. Recent flare of pain, redness and swelling of the right ankle. Recheck labs and use Colchicine qd for the next 5 days. - Uric acid - Sedimentation rate  2. 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 No open sores. Still on Trulicity, Neurontin, Metformin and Farxiga. Recheck labs. - CBC with Differential/Platelet - Comprehensive metabolic  panel - Hemoglobin A1c  3. HFrEF (heart failure with reduced ejection fraction) (HCC) Tolerating Entresto, Carvedilol, Lasix and Farxiga. Continue follow up with cardiologist. Recheck labs. - CBC with Differential/Platelet - Comprehensive metabolic panel - TSH       Juluis Mire, CMA   I, Christon Parada, PA-C, have reviewed all documentation for this visit. The documentation on 03/24/21 for the exam, diagnosis,  procedures, and orders are all accurate and complete.

## 2021-03-25 LAB — CBC WITH DIFFERENTIAL/PLATELET
Basophils Absolute: 0 10*3/uL (ref 0.0–0.2)
Basos: 0 %
EOS (ABSOLUTE): 0.1 10*3/uL (ref 0.0–0.4)
Eos: 2 %
Hematocrit: 47.3 % (ref 37.5–51.0)
Hemoglobin: 16.2 g/dL (ref 13.0–17.7)
Immature Grans (Abs): 0 10*3/uL (ref 0.0–0.1)
Immature Granulocytes: 0 %
Lymphocytes Absolute: 1.2 10*3/uL (ref 0.7–3.1)
Lymphs: 19 %
MCH: 29 pg (ref 26.6–33.0)
MCHC: 34.2 g/dL (ref 31.5–35.7)
MCV: 85 fL (ref 79–97)
Monocytes Absolute: 0.4 10*3/uL (ref 0.1–0.9)
Monocytes: 7 %
Neutrophils Absolute: 4.5 10*3/uL (ref 1.4–7.0)
Neutrophils: 72 %
Platelets: 212 10*3/uL (ref 150–450)
RBC: 5.59 x10E6/uL (ref 4.14–5.80)
RDW: 13.1 % (ref 11.6–15.4)
WBC: 6.2 10*3/uL (ref 3.4–10.8)

## 2021-03-25 LAB — COMPREHENSIVE METABOLIC PANEL
ALT: 20 IU/L (ref 0–44)
AST: 17 IU/L (ref 0–40)
Albumin/Globulin Ratio: 1.7 (ref 1.2–2.2)
Albumin: 4.5 g/dL (ref 4.0–5.0)
Alkaline Phosphatase: 108 IU/L (ref 44–121)
BUN/Creatinine Ratio: 17 (ref 9–20)
BUN: 14 mg/dL (ref 6–24)
Bilirubin Total: 0.9 mg/dL (ref 0.0–1.2)
CO2: 22 mmol/L (ref 20–29)
Calcium: 9.4 mg/dL (ref 8.7–10.2)
Chloride: 100 mmol/L (ref 96–106)
Creatinine, Ser: 0.84 mg/dL (ref 0.76–1.27)
Globulin, Total: 2.6 g/dL (ref 1.5–4.5)
Glucose: 157 mg/dL — ABNORMAL HIGH (ref 65–99)
Potassium: 4.4 mmol/L (ref 3.5–5.2)
Sodium: 139 mmol/L (ref 134–144)
Total Protein: 7.1 g/dL (ref 6.0–8.5)
eGFR: 106 mL/min/{1.73_m2} (ref >59)

## 2021-03-25 LAB — TSH: TSH: 2.96 u[IU]/mL (ref 0.450–4.500)

## 2021-03-25 LAB — URIC ACID: Uric Acid: 3.3 mg/dL — ABNORMAL LOW (ref 3.8–8.4)

## 2021-03-25 LAB — SEDIMENTATION RATE: Sed Rate: 35 mm/hr — ABNORMAL HIGH (ref 0–30)

## 2021-03-25 LAB — HEMOGLOBIN A1C
Est. average glucose Bld gHb Est-mCnc: 163 mg/dL
Hgb A1c MFr Bld: 7.3 % — ABNORMAL HIGH (ref 4.8–5.6)

## 2021-03-28 ENCOUNTER — Ambulatory Visit: Payer: Managed Care, Other (non HMO) | Admitting: Medical

## 2021-03-28 ENCOUNTER — Ambulatory Visit (INDEPENDENT_AMBULATORY_CARE_PROVIDER_SITE_OTHER): Payer: Managed Care, Other (non HMO)

## 2021-03-28 ENCOUNTER — Encounter: Payer: Self-pay | Admitting: Medical

## 2021-03-28 ENCOUNTER — Other Ambulatory Visit: Payer: Self-pay

## 2021-03-28 VITALS — BP 122/90 | HR 91 | Ht 71.0 in | Wt 198.0 lb

## 2021-03-28 DIAGNOSIS — R002 Palpitations: Secondary | ICD-10-CM

## 2021-03-28 DIAGNOSIS — I25118 Atherosclerotic heart disease of native coronary artery with other forms of angina pectoris: Secondary | ICD-10-CM

## 2021-03-28 DIAGNOSIS — E1159 Type 2 diabetes mellitus with other circulatory complications: Secondary | ICD-10-CM | POA: Diagnosis not present

## 2021-03-28 DIAGNOSIS — I152 Hypertension secondary to endocrine disorders: Secondary | ICD-10-CM

## 2021-03-28 DIAGNOSIS — Z8679 Personal history of other diseases of the circulatory system: Secondary | ICD-10-CM

## 2021-03-28 DIAGNOSIS — I428 Other cardiomyopathies: Secondary | ICD-10-CM

## 2021-03-28 DIAGNOSIS — Z720 Tobacco use: Secondary | ICD-10-CM

## 2021-03-28 DIAGNOSIS — E785 Hyperlipidemia, unspecified: Secondary | ICD-10-CM

## 2021-03-28 DIAGNOSIS — R55 Syncope and collapse: Secondary | ICD-10-CM

## 2021-03-28 MED ORDER — ASPIRIN 81 MG PO CHEW: 81.0000 mg | CHEWABLE_TABLET | Freq: Every day | ORAL | 2 refills | Status: DC

## 2021-03-28 NOTE — Patient Instructions (Signed)
Medication Instructions:   Your physician recommends that you continue on your current medications as directed. Please refer to the Current Medication list given to you today.  *If you need a refill on your cardiac medications before your next appointment, please call your pharmacy*   Lab Work:   Your physician recommends that you return for a FASTING lipid profile: at your earliest convenience.  - You will need to be fasting. Please do not have anything to eat or drink after midnight the morning you have the lab work. You may only have water or black coffee with no cream or sugar. - Please go to the [facility]. You will check in at the front desk to the right as you walk into the atrium. Valet Parking is offered if needed. - No appointment needed. You may go any day between 7 am and 6 pm.   Testing/Procedures:  1.  Your physician has requested that you have an echocardiogram. Echocardiography is a painless test that uses sound waves to create images of your heart. It provides your doctor with information about the size and shape of your heart and how well your heart's chambers and valves are working. This procedure takes approximately one hour. There are no restrictions for this procedure.  2.  Your physician has recommended that you wear a Zio XT monitor for 2 weeks.   This monitor is a medical device that records the heart's electrical activity. Doctors most often use these monitors to diagnose arrhythmias. Arrhythmias are problems with the speed or rhythm of the heartbeat. The monitor is a small device applied to your chest. You can wear one while you do your normal daily activities. While wearing this monitor if you have any symptoms to push the button and record what you felt. Once you have worn this monitor for the period of time provider prescribed (Usually 14 days), you will return the monitor device in the postage paid box. Once it is returned they will download the data  collected and provide [place] with a report which the provider will then review and we will call you with those results. Important tips:  1. Avoid showering during the first 24 hours of wearing the monitor. 2. Avoid excessive sweating to help maximize wear time. 3. Do not submerge the device, no hot tubs, and no swimming pools. 4. Keep any lotions or oils away from the patch. 5. After 24 hours you may shower with the patch on. Take brief showers with your back facing the shower head.  6. Do not remove patch once it has been placed because that will interrupt data and decrease adhesive wear time. 7. Push the button when you have any symptoms and write down what you were feeling. 8. Once you have completed wearing your monitor, remove and place into box which has postage paid and place in your outgoing mailbox.  9. If for some reason you have misplaced your box then call our office and we can provide another box and/or mail it off for you.       Follow-Up: At [facility], you and your health needs are our priority.  As part of our continuing mission to provide you with exceptional heart care, we have created designated Provider Care Teams.  These Care Teams include your primary Cardiologist (physician) and Advanced Practice Providers (APPs -  Physician Assistants and Nurse Practitioners) who all work together to provide you with the care you need, when you need it.  We recommend  signing up for the patient portal called "MyChart".  Sign up information is provided on this After Visit Summary.  MyChart is used to connect with patients for Virtual Visits (Telemedicine).  Patients are able to view lab/test results, encounter notes, upcoming appointments, etc.  Non-urgent messages can be sent to your provider as well.   To learn more about what you can do with MyChart, go to [email or web address].    Your next appointment:   6-8 weeks   The format for your next appointment:   In  Person  Provider:   You may see Julien Nordmann, MD or one of the following Advanced Practice Providers on your designated Care Team:    Nicolasa Ducking, NP  Eula Listen, PA-C  Marisue Ivan, PA-C  Cadence [place], [place]  Gillian Shields, NP    Other Instructions

## 2021-03-28 NOTE — Progress Notes (Signed)
Cardiology Office Note:    Date:  03/28/2021   ID:  Keith [name], DOB 31-Aug-1971, MRN [id number]  PCP:  Pcp, No  [facility] Cardiologist:  Ida Rogue, MD  [place] Electrophysiologist:  None   Referring MD: Florian Buff*   Chief Complaint: 6 month follow-up  History of Present Illness:    Keith [name] is a 49 y.o. male with a hx of tobacco use, dilated CM, secondary to hypertensive disease, chronic systolic CHF, CAD, CKD stage 3 followed by nephrology, DM2 who presents for follow-up.   Patient had [place] 06/2017 with proximal circumflex 30%, pRCA 30%, mid to distal RCA, 30% with moderate to severe LV function. EF down to 30-35%. In the setting of mild nonobstructive disease, cardiomyopathy felt to be due to hypertensive heart disease. Echo 03/2018 showed LVEF 35-40%, He has been on losartan, spironolactone, lasix, potassium. He was transitioned to [facility] 09/2019.   Patient was last seen 10/01/2020 and had stopped coreg due to possible interaction with colchcine. This was resumed. Echo was updated, but not performed.   Patient had recent labs 5/9 which were pretty unremarkable.  Today, the patient reports palpitations with possible presyncope episode this morning. He went to work at [employer], slept well. He was walking and started feeling weird. Felt off balance and dizzy. Started sweating, and had nausea. He felt "swimmy headed".  No chest pain. Had some chest fluttering. He felt generalized numbness throughout the body. He sat down and then went to cool down. He ate something and eventually symptoms resolved. Did not check his sugar. EMS was not called. Also reports fluttering the last few weeks. Feels a little short of breath with it. Can last a minute or 10 minutes. No known triggers. Happening every day, throughout the day. Had some dizziness with the fluttering. Can have tunnel vision. No weakness or tingling. No weakness. Has not taken vitals during an  episode. No recent fever, chills, vomiting. He has gout and some pedal swelling. No orthopnea or pnd. BP good today with HR in the 90s. EKG showed SR with old LBBB, heart rate 91bpm. Appetite in general is bad with multiple medications. He is smoking 1 ppd, doesn't want to quit. No alcohol or drug use. Asking for a different ED medication, will defer to PCP.   Past Medical History:  Diagnosis Date  . Chronic combined systolic (congestive) and diastolic (congestive) heart failure ([place])    a. 06/2017 Echo: EF 35-40%, Gr1 DD; b. 02/2018 Echo: Ef 30-35%; c. 03/2018 Echo: EF 35-40%, mid-apicalanteroseptal, ant, and apical HK. Mildly dil LA. No LV thrombus.  . Diabetes mellitus   . Erectile dysfunction   . Essential hypertension   . Frequent headaches   . NICM (nonischemic cardiomyopathy) ([place])    a. 06/2017 Echo: EF 35-40%, Gr1 DD; b. 06/2017 Cath: nonobs dzs; c. 02/2018 Echo: Ef 30-35%; d. 03/2018 Echo: EF 35-40%, mid-apicalanteroseptal, ant, and apical HK. Mildly dil LA. No LV thrombus.  . Non-obstructive Coronary Artery Disease    a. 06/2017 Cath: LM nl, LAD nl, D1/2/3 nl, LCX 30p, OM1 nl, OM2 min irregs, OM3/4 nl, RCA 30p/m, RPL2/3 nl.  . TIA (transient ischemic attack)    a. 02/2018 MRA w/o acute findings; b. 02/2018 [place] U/S: no hemodynamically significant stenoses.  . Tobacco abuse     Past Surgical History:  Procedure Laterality Date  . COLONOSCOPY WITH PROPOFOL N/A 02/19/2017   Procedure: COLONOSCOPY WITH PROPOFOL;  Surgeon: Jonathon Bellows, MD;  Location: [facility]  ENDOSCOPY;  Service: Endoscopy;  Laterality: N/A;  . ESOPHAGOGASTRODUODENOSCOPY (EGD) WITH PROPOFOL N/A 02/19/2017   Procedure: ESOPHAGOGASTRODUODENOSCOPY (EGD) WITH PROPOFOL;  Surgeon: Jonathon Bellows, MD;  Location: [facility] ENDOSCOPY;  Service: Endoscopy;  Laterality: N/A;  . LEFT HEART CATH AND CORONARY ANGIOGRAPHY N/A 07/11/2017   Procedure: LEFT HEART CATH AND CORONARY ANGIOGRAPHY;  Surgeon: Wellington Hampshire, MD;  Location: [facility] CV LAB;   Service: Cardiovascular;  Laterality: N/A;    Current Medications: Current Meds  Medication Sig  . allopurinol (ZYLOPRIM) 100 MG tablet Take 1 tablet (100 mg total) by mouth daily.  [name] Kitchen atorvastatin (LIPITOR) 40 MG tablet TAKE 1 TABLET BY MOUTH ONCE DAILY AT  6PM  . Blood Glucose Monitoring Suppl (ONETOUCH VERIO) w/Device KIT Use daily to check blood glucose  . carvedilol (COREG) 12.5 MG tablet Take 1 tablet (12.5 mg total) by mouth 2 (two) times daily.  . colchicine 0.6 MG tablet Take 1 tablet (0.6 mg total) by mouth daily.  . dapagliflozin propanediol (FARXIGA) 10 MG TABS tablet Take 1 tablet (10 mg total) by mouth daily before breakfast.  . Dulaglutide (TRULICITY) 8.14 GY/1.8HU SOPN Inject 0.75 mg into the skin once a week.  . furosemide (LASIX) 40 MG tablet Take 1 tablet (40 mg total) by mouth daily.  [name] Kitchen gabapentin (NEURONTIN) 300 MG capsule Take 1 capsule (300 mg total) by mouth at bedtime.  [name] Kitchen glucose blood (ONETOUCH VERIO) test strip Use daily to check blood glucose  . Lancets (ONETOUCH ULTRASOFT) lancets Use daily to check blood glucose  . metFORMIN (GLUCOPHAGE) 500 MG tablet Take 2 tablets by mouth twice daily  . sacubitril-valsartan (ENTRESTO) 97-103 MG Take 1 tablet by mouth 2 (two) times daily.  [name] Kitchen spironolactone (ALDACTONE) 25 MG tablet Take 1 tablet by mouth once daily  . [DISCONTINUED] aspirin 81 MG chewable tablet Chew 1 tablet (81 mg total) by mouth daily.     Allergies:   Other   Social History   Socioeconomic History  . Marital status: Married    Spouse name: Not on file  . Number of children: Not on file  . Years of education: Not on file  . Highest education level: Not on file  Occupational History  . Not on file  Tobacco Use  . Smoking status: Current Every Day Smoker    Packs/day: 1.00    Types: Cigarettes  . Smokeless tobacco: Never Used  Vaping Use  . Vaping Use: Never used  Substance and Sexual Activity  . Alcohol use: Yes    Comment: Occasionally   .  Drug use: No  . Sexual activity: Yes  Other Topics Concern  . Not on file  Social History Narrative  . Not on file   Social Determinants of Health   Financial Resource Strain: Not on file  Food Insecurity: Not on file  Transportation Needs: Not on file  Physical Activity: Not on file  Stress: Not on file  Social Connections: Not on file     Family History: The patient's *family history includes Diabetes in his father and mother; Hypertension in his father and mother.  ROS:   Please see the history of present illness.     All other systems reviewed and are negative.  EKGs/Labs/Other Studies Reviewed:    The following studies were reviewed today:   Echo 03/2018 Study Conclusions   - Procedure narrative: LIMITED TTE to assess for LV apical thrombus  and LVF.  - Left ventricle: The cavity size was normal. Systolic function was  moderately reduced. The estimated ejection fraction was in the  range of 35% to 40%. Hypokinesis of the mid-apicalanteroseptal,  anterior, and apical myocardium.  - Left atrium: The atrium was mildly dilated.   Impressions:   - No LV thrombus.   Echo 02/2018 Left ventricle: The cavity size was mildly dilated. Systolic  function was moderately to severely reduced. The estimated  ejection fraction was in the range of 30% to 35%. Hypokinesis of  the anterior myocardium. Hypokinesis of the anteroseptal  myocardium. Hypokinesis of the apical myocardium.  - Mitral valve: There was mild regurgitation.  - Left atrium: The atrium was mildly dilated.  - Right ventricle: Systolic function was normal.  - Pulmonary arteries: Systolic pressure was mild to moderately  elevated PA peak pressure: 44 mm Hg (S).   Impressions:   - Unable to exclude apical thrombus. Severe apical hypokinesis, if  not akinesis. Consider imaging of apical region with definity if  clinically indicated.   Echo 06/2107 Procedure narrative: Transthoracic  echocardiography. Image  quality was poor. The study was technically difficult, as a  result of poor acoustic windows and poor sound wave transmission.  Intravenous contrast (Definity) was administered.  - Left ventricle: The cavity size was mildly dilated. There was  mild concentric hypertrophy. Systolic function was moderately  reduced. The estimated ejection fraction was in the range of 35%  to 40%. Diffuse hypokinesis. Features are consistent with a  pseudonormal left ventricular filling pattern, with concomitant  abnormal relaxation and increased filling pressure (grade 2  diastolic dysfunction).  - Left atrium: The atrium was mildly dilated.   [facility] 06/2017  Prox Cx lesion, 30 %stenosed.  Prox RCA lesion, 30 %stenosed.  Mid RCA to Dist RCA lesion, 30 %stenosed.  There is moderate to severe left ventricular systolic dysfunction.  LV end diastolic pressure is moderately elevated.  There is trivial (1+) mitral regurgitation.  1. Mild nonobstructive coronary artery disease. 2. Moderately to severely reduced LV systolic function with an EF of 30-35% likely due to hypertensive heart disease. 3. Moderately elevated left ventricular end-diastolic pressure. 4. Aortic root angiography showed no evidence of ascending aortic aneurysm or dissection.  Recommendations: I suspect that the patient's symptoms are likely due to acute systolic heart failure due to hypertensive heart disease with new left bundle branch block. There is no culprit for unstable angina. We need to start him on heart failure medications. I initiated treatment with carvedilol and Entresto (the patient tolerated losartan in the past). Gentle diuresis with oral furosemide. Smoking cessation is advised.   EKG:  EKG is  ordered today.  The ekg ordered today demonstrates SR, known LBBB, QRS 168bpm  Recent Labs: 03/24/2021: ALT 20; BUN 14; Creatinine, Ser 0.84; Hemoglobin 16.2; Platelets 212; Potassium  4.4; Sodium 139; TSH 2.960  Recent Lipid Panel    Component Value Date/Time   CHOL 171 08/09/2019 0933   TRIG 255 (H) 08/09/2019 0933   HDL 32 (L) 08/09/2019 0933   CHOLHDL 5.3 (H) 08/09/2019 0933   CHOLHDL 4.3 03/03/2018 0610   VLDL 33 03/03/2018 0610   LDLCALC 96 08/09/2019 0933   Physical Exam:    VS:  BP 122/90 (BP Location: Left Arm, Patient Position: Sitting, Cuff Size: Normal)   Pulse 91   Ht _0  (1.803 m)   Wt 198 lb (89.8 kg)   SpO2 96%   BMI 27.62 kg/m     Wt Readings from Last 3 Encounters:  03/28/21 198 lb (89.8 kg)  03/24/21 205  lb (93 kg)  03/18/21 210 lb (95.3 kg)     GYB:WLSL nourished, well developed in no acute distress HEENT: Normal NECK: No JVD; No carotid bruits LYMPHATICS: No lymphadenopathy CARDIAC: RRR, no murmurs, rubs, gallops RESPIRATORY:  Clear to auscultation without rales, wheezing or rhonchi  ABDOMEN: Soft, non-tender, non-distended MUSCULOSKELETAL:  No edema; No deformity  SKIN: Warm and dry NEUROLOGIC:  Alert and oriented x 3 PSYCHIATRIC:  Normal affect   ASSESSMENT:    1. Palpitations   2. History of cardiomyopathy   3. Coronary artery disease of native artery of native heart with stable angina pectoris ([place])   4. Hypertension associated with diabetes ([place])   5. NICM (nonischemic cardiomyopathy) ([place])   6. Hyperlipidemia LDL goal <70   7. Pre-syncope   8. Tobacco use    PLAN:    In order of problems listed above:  Palpitations Presyncope Palpitations started a few weeks ago described as a fluttering. Fluttering in epigastric region. They occur intermittently throughout the day. No chest pain. Sometimes has associated SOB. Can be brief, I minute or up to 10 minutes. No known triggers. Moreso, this morning had a pre-syncopal episode associated with the fluttering. He was walking when he had dizziness, gait disturbance, feeling hot, nausea, generalized numbness. Symptoms reoslved with rest and food. Vitals and BG not checked.  EMS was not called. Appetite is low so doesn't eat much, and tries to stay hydrated. EKG today shows SR with known LBBB. No fluttering episodes in the office. Nothing significant on exam. Recent labs showed normal TSH, electrolytes, and Hgb. I will order a 2 week heart monitor and an echo. If symptoms worsen or he passes out, recommend ER evaluation. We will see patient back after procedure. Recommend daily meals (esp with diabetes) and staying well-hydrated.   Dilated Cardiomyopathy/HFrEF/Hypertensive heart disease Most recent echo in 2019 showed mildly improved EF 35-40%. At the last visit echo was ordered but not performed. He takes lasix 86m daily. He is euvolemic on exam. Recent labs showed normal kidney function and electrolytes. Repeat echo. Continue coreg, entresto, spironolactone.  Nonobstructive CAD Nonobstructive CAD per [facility] in 2018. Denies anginal symptoms. Continue Aspirin, BB, statin.  DM2 A1C 7.3. Followed by PCP.  HLD Continue Atorvastatin 458mdaily. I will update lipid panel.   Tobacco use Smokes 1ppd and does not plan on quiting.  Disposition: Follow up in 6-8 week(s) with MD/APP   Signed, Robertlee Rogacki H Ninfa MeekerPA-C  03/28/2021 3:05 PM    [facility]

## 2021-03-31 ENCOUNTER — Ambulatory Visit: Payer: Managed Care, Other (non HMO) | Admitting: Cardiovascular Disease

## 2021-04-01 ENCOUNTER — Telehealth: Payer: Self-pay | Admitting: Cardiovascular Disease

## 2021-04-01 NOTE — Telephone Encounter (Signed)
Patient is requesting work note for 5/13 visit and 5/14 . Says he will come at end of day to pick up.

## 2021-04-02 NOTE — Telephone Encounter (Signed)
Cadence is out of the office today. Back in hospital tomorrow, I will forward to her as I do not have any instructions from provider to write pt to out of work for an additional day.

## 2021-04-02 NOTE — Telephone Encounter (Signed)
Patient calling for status update on work note letter .    Please call.

## 2021-04-03 NOTE — Telephone Encounter (Signed)
Patients daughter came into office today to check on status of letter. Made aware that patients provider has not been able to sign off on the request for a letter. Office will be in touch

## 2021-04-04 ENCOUNTER — Encounter: Payer: Self-pay | Admitting: Medical

## 2021-04-04 NOTE — Telephone Encounter (Signed)
Spoke to pt's wife, Joni Reining ok per DPR, notified that Cadence did approve that he may have note to be written out for 5/13 and 5/14. Discussed DOT requirements with Joni Reining and left a copy at front with pt's letter. Notified that depending on echo results, if needs any additional paperwork filled out stating pt needs to be out of work then we will need DOT paperwork sent for provider.  Joni Reining verbalized understanding.

## 2021-04-10 DIAGNOSIS — R002 Palpitations: Secondary | ICD-10-CM | POA: Diagnosis not present

## 2021-05-05 ENCOUNTER — Other Ambulatory Visit: Payer: Self-pay

## 2021-05-05 ENCOUNTER — Ambulatory Visit (INDEPENDENT_AMBULATORY_CARE_PROVIDER_SITE_OTHER): Payer: Managed Care, Other (non HMO)

## 2021-05-05 DIAGNOSIS — I447 Left bundle-branch block, unspecified: Secondary | ICD-10-CM

## 2021-05-05 DIAGNOSIS — Z8679 Personal history of other diseases of the circulatory system: Secondary | ICD-10-CM | POA: Diagnosis not present

## 2021-05-06 ENCOUNTER — Telehealth: Payer: Self-pay

## 2021-05-06 LAB — ECHOCARDIOGRAM COMPLETE
Area-P 1/2: 5.6 cm2
Calc EF: 47 %
S' Lateral: 4.9 cm
Single Plane A2C EF: 51.2 %
Single Plane A4C EF: 39.7 %

## 2021-05-06 NOTE — Telephone Encounter (Signed)
Was able to reach out to pt via phone and make contact to review their recent ZIO monitor results. Dr. Mariah [name] advised based on the current results   Event monitor  Numerous episodes of tachycardia noted ranging from several seconds up to  Would make sure taking the carvedilol twice a day   [name] reports seen Dr. Windell [name] advice on his MyChart account, reported that his PCP told him not to take carvedilol twice a day, that it could interfere with his other meds, but reports once he seen Dr. Windell [name] advise he started back to BID. Advised pt for future med changes or concerns with cardiac meds, can all in with advise to see what Dr. Mariah [name] or our pharmacy team recommends.   Pt verbalized understanding, is thankful for the results call, all questions and concerns were address. Will call back for anything further, f/u as schedule 7/18 with Keith Fransico Michael, PA-C

## 2021-05-13 ENCOUNTER — Telehealth: Payer: Self-pay

## 2021-05-13 NOTE — Telephone Encounter (Signed)
Able to reach pt regarding his recent echo, Dr. Mariah Milling had a chance to review his results and advised   "Echocardiogram  Ejection fraction improved compared to prior study  Now 40 to 45%'  Mr. Janusz is pleased with results have improved since prior study, will feep f/u appt 7/18 with Cadence Fransico Michael, PA-C for further review of results,  otherwise all questions or concerns were address and no additional concerns at this time, will call back for anything further.

## 2021-05-20 ENCOUNTER — Other Ambulatory Visit: Payer: Self-pay

## 2021-05-20 MED ORDER — SILDENAFIL CITRATE 50 MG PO TABS: 50.0000 mg | ORAL_TABLET | ORAL | 6 refills | Status: DC | PRN

## 2021-05-20 NOTE — Telephone Encounter (Signed)
New script for Sildenafil per Dr. Mariah Milling sent to [facility], refer to MyChart conversation regarding medication.

## 2021-05-28 ENCOUNTER — Other Ambulatory Visit: Payer: Self-pay | Admitting: Physician Assistant

## 2021-05-28 DIAGNOSIS — I5020 Unspecified systolic (congestive) heart failure: Secondary | ICD-10-CM

## 2021-05-29 ENCOUNTER — Telehealth: Payer: Self-pay

## 2021-05-29 ENCOUNTER — Other Ambulatory Visit: Payer: Self-pay

## 2021-05-29 DIAGNOSIS — E118 Type 2 diabetes mellitus with unspecified complications: Secondary | ICD-10-CM

## 2021-05-29 MED ORDER — METFORMIN HCL 500 MG PO TABS: 1000.0000 mg | ORAL_TABLET | Freq: Two times a day (BID) | ORAL | 1 refills | Status: DC

## 2021-05-29 NOTE — Telephone Encounter (Signed)
[facility] faxed refill request for the following medications: ° °metFORMIN (GLUCOPHAGE) 500 MG tablet  ° °Please advise. °

## 2021-05-30 NOTE — Progress Notes (Deleted)
Cardiology Office Note:    Date:  05/30/2021   ID:  [name], DOB [birth year], MRN [id number]  PCP:  [facility]  [facility] Cardiologist:  Julien Nordmann, MD  [facility] Electrophysiologist:  None   Referring MD: No ref. provider found   Chief Complaint: 6 to 8-week follow-up  History of Present Illness:    Keith [name] is a 50 y.o. male with a hx of tobacco use, dilated cardiomyopathy secondary to hypertensive disease, chronic systolic CHF, CAD, CKD stage III followed by nephrology, diabetes type 2 who presents for follow-up.  Patient had a left heart cath 06/2017 with proximal circumflex 30%, P RCA 30%, mid to distal RCA 30% with moderate to severe LV dysfunction, EF down to 30 to 35%.  The setting of mild nonobstructive disease, cardiomyopathy felt to be due to hypertensive heart disease.  Echo 03/2018 showed LVEF 35 to 40%.  He has been on losartan, spironolactone, Lasix, potassium.  He was transition to [facility] 10/06/2019.  He was seen 10/01/2020 and Coreg was stopped due to possible interaction with colchicine however this was since resumed.  The patient was last seen 03/28/2021 and reported palpitations with a presyncopal episode.  KG showed sinus rhythm with old LBBB.  Heart monitor, labs, echo was ordered.  Today,  Past Medical History:  Diagnosis Date   Chronic combined systolic (congestive) and diastolic (congestive) heart failure (HCC)    a. 06/2017 Echo: EF 35-40%, Gr1 DD; b. 02/2018 Echo: Ef 30-35%; c. 03/2018 Echo: EF 35-40%, mid-apicalanteroseptal, ant, and apical HK. Mildly dil LA. No LV thrombus.   Diabetes mellitus    Erectile dysfunction    Essential hypertension    Frequent headaches    NICM (nonischemic cardiomyopathy) (HCC)    a. 06/2017 Echo: EF 35-40%, Gr1 DD; b. 06/2017 Cath: nonobs dzs; c. 02/2018 Echo: Ef 30-35%; d. 03/2018 Echo: EF 35-40%, mid-apicalanteroseptal, ant, and apical HK. Mildly dil LA. No LV thrombus.    Non-obstructive Coronary Artery Disease    a. 06/2017 Cath: LM nl, LAD nl, D1/2/3 nl, LCX 30p, OM1 nl, OM2 min irregs, OM3/4 nl, RCA 30p/m, RPL2/3 nl.   TIA (transient ischemic attack)    a. 02/2018 MRA w/o acute findings; b. 02/2018 Carotis U/S: no hemodynamically significant stenoses.   Tobacco abuse     Past Surgical History:  Procedure Laterality Date   COLONOSCOPY WITH PROPOFOL N/A 02/19/2017   Procedure: COLONOSCOPY WITH PROPOFOL;  Surgeon: [name], MD;  Location: [facility] ENDOSCOPY;  Service: Endoscopy;  Laterality: N/A;   ESOPHAGOGASTRODUODENOSCOPY (EGD) WITH PROPOFOL N/A 02/19/2017   Procedure: ESOPHAGOGASTRODUODENOSCOPY (EGD) WITH PROPOFOL;  Surgeon: [name], MD;  Location: [facility] ENDOSCOPY;  Service: Endoscopy;  Laterality: N/A;   LEFT HEART CATH AND CORONARY ANGIOGRAPHY N/A 07/11/2017   Procedure: LEFT HEART CATH AND CORONARY ANGIOGRAPHY;  Surgeon: [name], MD;  Location: [facility] INVASIVE CV LAB;  Service: Cardiovascular;  Laterality: N/A;    Current Medications: No outpatient medications have been marked as taking for the 06/02/21 encounter (Appointment) with Fransico Michael, Jameia Makris H, PA-C.     Allergies:   Other   Social History   Socioeconomic History   Marital status: Married    Spouse name: Not on file   Number of children: Not on file   Years of education: Not on file   Highest education level: Not on file  Occupational History   Not on file  Tobacco Use   Smoking status: Every Day    Packs/day: 1.00  Types: Cigarettes   Smokeless tobacco: Never  Vaping Use   Vaping Use: Never used  Substance and Sexual Activity   Alcohol use: Yes    Comment: Occasionally    Drug use: No   Sexual activity: Yes  Other Topics Concern   Not on file  Social History Narrative   Not on file   Social Determinants of Health   Financial Resource Strain: Not on file  Food Insecurity: Not on file  Transportation Needs: Not on file  Physical Activity: Not on file  Stress: Not on  file  Social Connections: Not on file     Family History: The patient's ***family history includes Diabetes in his father and mother; Hypertension in his father and mother.  ROS:   Please see the history of present illness.    *** All other systems reviewed and are negative.  EKGs/Labs/Other Studies Reviewed:    The following studies were reviewed today:  Echo 05/05/2021  1. Left ventricular ejection fraction, by estimation, is 40 to 45%. The  left ventricle has mildly decreased function. The left ventricle  demonstrates global hypokinesis. Regional wall motional abnormalities  cannot be excluded. There is mild left  ventricular hypertrophy. Left ventricular diastolic parameters are  indeterminate. The average left ventricular global longitudinal strain is  -10.4 %. The global longitudinal strain is abnormal.   2. Right ventricular systolic function is normal. The right ventricular  size is normal.   3. The mitral valve is grossly normal. No evidence of mitral valve  regurgitation. No evidence of mitral stenosis.   4. The aortic valve has an indeterminant number of cusps. Aortic valve  regurgitation is not visualized. No aortic stenosis is present.   5. There is borderline dilatation of the ascending aorta, measuring 36  mm.   6. The inferior vena cava is normal in size with greater than 50%  respiratory variability, suggesting right atrial pressure of 3 mmHg.   Comparison(s): Previous Echo showed LV EF 35-40%, moderately reduced  systolic function, HK of the mid-apical anteroseptal, anterior and apical  myocardium.   Heart monitor 04/22/2021 Normal sinus rhythm Patient had a min HR of 53 bpm, max HR of 200 bpm, and avg HR of 87 bpm. Bundle Branch Block/IVCD was present. QRS morphology changes were present throughout recording.   1547 Supraventricular Tachycardia/atrtial tachycardia runs occurred, the run with the fastest interval lasting 14 beats with a max rate of 200 bpm,  the longest lasting 7 mins 54 secs with an avg rate of 118 bpm. Supraventricular Tachycardia was detected within +/- 45 seconds of symptomatic patient event(s).   Isolated SVEs were rare (<1.0%), SVE Couplets were rare (<1.0%), and SVE Triplets were rare (<1.0%). Isolated VEs were rare (<1.0%), and no VE Couplets or VE Triplets were present.  EKG:  EKG is *** ordered today.  The ekg ordered today demonstrates ***  Recent Labs: 03/24/2021: ALT 20; BUN 14; Creatinine, Ser 0.84; Hemoglobin 16.2; Platelets 212; Potassium 4.4; Sodium 139; TSH 2.960  Recent Lipid Panel    Component Value Date/Time   CHOL 171 08/09/2019 0933   TRIG 255 (H) 08/09/2019 0933   HDL 32 (L) 08/09/2019 0933   CHOLHDL 5.3 (H) 08/09/2019 0933   CHOLHDL 4.3 03/03/2018 0610   VLDL 33 03/03/2018 0610   LDLCALC 96 08/09/2019 0933     Risk Assessment/Calculations:   {Does this patient have ATRIAL FIBRILLATION?:[phone]}   Physical Exam:    VS:  There were no vitals taken for this visit.  Wt Readings from Last 3 Encounters:  03/28/21 198 lb (89.8 kg)  03/24/21 205 lb (93 kg)  03/18/21 210 lb (95.3 kg)     GEN: *** Well nourished, well developed in no acute distress HEENT: Normal NECK: No JVD; No carotid bruits LYMPHATICS: No lymphadenopathy CARDIAC: ***RRR, no murmurs, rubs, gallops RESPIRATORY:  Clear to auscultation without rales, wheezing or rhonchi  ABDOMEN: Soft, non-tender, non-distended MUSCULOSKELETAL:  No edema; No deformity  SKIN: Warm and dry NEUROLOGIC:  Alert and oriented x 3 PSYCHIATRIC:  Normal affect   ASSESSMENT:    1. Pre-syncope   2. Palpitations   3. Hypertension associated with diabetes (HCC)   4. Tobacco use   5. NICM (nonischemic cardiomyopathy) (HCC)   6. Type 2 diabetes mellitus without complication, without long-term current use of insulin (HCC)   7. Hyperlipidemia, mixed    PLAN:    In order of problems listed above:  Palpitations Presyncope  Nonischemic  cardiomyopathy HFrEF Hypertensive heart disease  Nonobstructive CAD  Diabetes type 2  Hyperlipidemia  Tobacco use   Disposition: Follow up {follow up:15908} with ***   Shared Decision Making/Informed Consent   {Are you ordering a CV Procedure (e.g. stress test, cath, DCCV, TEE, etc)?   Press F2        :[id number]}    Signed, Treyshawn Muldrew Ardelle Lesches  05/30/2021 9:58 AM    [facility]

## 2021-06-02 ENCOUNTER — Ambulatory Visit: Payer: Managed Care, Other (non HMO) | Admitting: Medical

## 2021-07-05 NOTE — Progress Notes (Deleted)
Cardiology Office Note:    Date:  07/05/2021   ID:  Keith [name], DOB [birth year], MRN [id number]  PCP:  [facility]  [facility] Cardiologist:  Julien Nordmann, MD  [facility] Electrophysiologist:  None   Referring MD: [facility]   Chief Complaint: 6-8 week follow-up  History of Present Illness:    Keith [name] is a 50 y.o. male with a hx of tobacco use, dilated CM, secondary to hypertensive disease, chronic systolic CHF, CAD, CKD stage 3 followed by nephrology, DM2 who presents for follow-up.    Patient had LHC 06/2017 with proximal circumflex 30%, pRCA 30%, mid to distal RCA, 30% with moderate to severe LV function. EF down to 30-35%. In the setting of mild nonobstructive disease, cardiomyopathy felt to be due to hypertensive heart disease. Echo 03/2018 showed LVEF 35-40%, He has been on losartan, spironolactone, lasix, potassium. He was transitioned to [facility] 09/2019.    Patient was seen 10/01/2020 and had stopped coreg due to possible interaction with colchcine. This was resumed. Echo was updated, but not performed.   Last seen 03/28/21 and reported palpitations with presyncope. Heart monitor was ordered. This showed NSR, avg HR 87bpm, 1547 runs of SVT, longest 7 minutes. SVT detected with triggered events.   Today,       Past Medical History:  Diagnosis Date   Chronic combined systolic (congestive) and diastolic (congestive) heart failure (HCC)    a. 06/2017 Echo: EF 35-40%, Gr1 DD; b. 02/2018 Echo: Ef 30-35%; c. 03/2018 Echo: EF 35-40%, mid-apicalanteroseptal, ant, and apical HK. Mildly dil LA. No LV thrombus.   Diabetes mellitus    Erectile dysfunction    Essential hypertension    Frequent headaches    NICM (nonischemic cardiomyopathy) (HCC)    a. 06/2017 Echo: EF 35-40%, Gr1 DD; b. 06/2017 Cath: nonobs dzs; c. 02/2018 Echo: Ef 30-35%; d. 03/2018 Echo: EF 35-40%, mid-apicalanteroseptal, ant, and apical HK. Mildly dil LA. No LV  thrombus.   Non-obstructive Coronary Artery Disease    a. 06/2017 Cath: LM nl, LAD nl, D1/2/3 nl, LCX 30p, OM1 nl, OM2 min irregs, OM3/4 nl, RCA 30p/m, RPL2/3 nl.   TIA (transient ischemic attack)    a. 02/2018 MRA w/o acute findings; b. 02/2018 Carotis U/S: no hemodynamically significant stenoses.   Tobacco abuse     Past Surgical History:  Procedure Laterality Date   COLONOSCOPY WITH PROPOFOL N/A 02/19/2017   Procedure: COLONOSCOPY WITH PROPOFOL;  Surgeon: [name], MD;  Location: [facility] ENDOSCOPY;  Service: Endoscopy;  Laterality: N/A;   ESOPHAGOGASTRODUODENOSCOPY (EGD) WITH PROPOFOL N/A 02/19/2017   Procedure: ESOPHAGOGASTRODUODENOSCOPY (EGD) WITH PROPOFOL;  Surgeon: [name], MD;  Location: [facility] ENDOSCOPY;  Service: Endoscopy;  Laterality: N/A;   LEFT HEART CATH AND CORONARY ANGIOGRAPHY N/A 07/11/2017   Procedure: LEFT HEART CATH AND CORONARY ANGIOGRAPHY;  Surgeon: [name], MD;  Location: [facility] INVASIVE CV LAB;  Service: Cardiovascular;  Laterality: N/A;    Current Medications: No outpatient medications have been marked as taking for the 07/07/21 encounter (Appointment) with Fransico Michael, Eimi Viney H, PA-C.     Allergies:   Other   Social History   Socioeconomic History   Marital status: Married    Spouse name: Not on file   Number of children: Not on file   Years of education: Not on file   Highest education level: Not on file  Occupational History   Not on file  Tobacco Use   Smoking status: Every Day  Packs/day: 1.00    Types: Cigarettes   Smokeless tobacco: Never  Vaping Use   Vaping Use: Never used  Substance and Sexual Activity   Alcohol use: Yes    Comment: Occasionally    Drug use: No   Sexual activity: Yes  Other Topics Concern   Not on file  Social History Narrative   Not on file   Social Determinants of Health   Financial Resource Strain: Not on file  Food Insecurity: Not on file  Transportation Needs: Not on file  Physical Activity: Not on file   Stress: Not on file  Social Connections: Not on file     Family History: The patient's ***family history includes Diabetes in his father and mother; Hypertension in his father and mother.  ROS:   Please see the history of present illness.    *** All other systems reviewed and are negative.  EKGs/Labs/Other Studies Reviewed:    The following studies were reviewed today:  Heart monitor 04/2021 Normal sinus rhythm Patient had a min HR of 53 bpm, max HR of 200 bpm, and avg HR of 87 bpm.  Bundle Branch Block/IVCD was present. QRS morphology changes were present throughout recording.    1547 Supraventricular Tachycardia/atrtial tachycardia runs occurred, the run with the fastest interval lasting 14 beats with a max rate of 200 bpm, the longest lasting 7 mins 54 secs with an avg rate of 118 bpm.  Supraventricular Tachycardia was detected within +/- 45 seconds of symptomatic patient event(s).    Isolated SVEs were rare (<1.0%), SVE Couplets were rare (<1.0%), and SVE Triplets were rare (<1.0%).  Isolated VEs were rare (<1.0%), and no VE Couplets or VE Triplets were present.   Echo 04/2021 1. Left ventricular ejection fraction, by estimation, is 40 to 45%. The  left ventricle has mildly decreased function. The left ventricle  demonstrates global hypokinesis. Regional wall motional abnormalities  cannot be excluded. There is mild left  ventricular hypertrophy. Left ventricular diastolic parameters are  indeterminate. The average left ventricular global longitudinal strain is  -10.4 %. The global longitudinal strain is abnormal.   2. Right ventricular systolic function is normal. The right ventricular  size is normal.   3. The mitral valve is grossly normal. No evidence of mitral valve  regurgitation. No evidence of mitral stenosis.   4. The aortic valve has an indeterminant number of cusps. Aortic valve  regurgitation is not visualized. No aortic stenosis is present.   5. There is  borderline dilatation of the ascending aorta, measuring 36  mm.   6. The inferior vena cava is normal in size with greater than 50%  respiratory variability, suggesting right atrial pressure of 3 mmHg.   Comparison(s): Previous Echo showed LV EF 35-40%, moderately reduced  systolic function, HK of the mid-apical anteroseptal, anterior and apical  myocardium.  EKG:  EKG is *** ordered today.  The ekg ordered today demonstrates ***  Recent Labs: 03/24/2021: ALT 20; BUN 14; Creatinine, Ser 0.84; Hemoglobin 16.2; Platelets 212; Potassium 4.4; Sodium 139; TSH 2.960  Recent Lipid Panel    Component Value Date/Time   CHOL 171 08/09/2019 0933   TRIG 255 (H) 08/09/2019 0933   HDL 32 (L) 08/09/2019 0933   CHOLHDL 5.3 (H) 08/09/2019 0933   CHOLHDL 4.3 03/03/2018 0610   VLDL 33 03/03/2018 0610   LDLCALC 96 08/09/2019 0933     Risk Assessment/Calculations:   {Does this patient have ATRIAL FIBRILLATION?[phone]}   Physical Exam:    VS:  There were no vitals taken for this visit.    Wt Readings from Last 3 Encounters:  03/28/21 198 lb (89.8 kg)  03/24/21 205 lb (93 kg)  03/18/21 210 lb (95.3 kg)     GEN: *** Well nourished, well developed in no acute distress HEENT: Normal NECK: No JVD; No carotid bruits LYMPHATICS: No lymphadenopathy CARDIAC: ***RRR, no murmurs, rubs, gallops RESPIRATORY:  Clear to auscultation without rales, wheezing or rhonchi  ABDOMEN: Soft, non-tender, non-distended MUSCULOSKELETAL:  No edema; No deformity  SKIN: Warm and dry NEUROLOGIC:  Alert and oriented x 3 PSYCHIATRIC:  Normal affect   ASSESSMENT:    No diagnosis found. PLAN:    In order of problems listed above:  Palpitations Presyncope  Dilated CM HFrEF  Nonobstructive CAD  DM2  HLD  Tobacco use  Disposition: Follow up {follow up:15908} with ***   Shared Decision Making/Informed Consent   {Are you ordering a CV Procedure (e.g. stress test, cath, DCCV, TEE, etc)?   Press F2         :[id number]}    Signed, Hendrix Yurkovich David Stall, PA-C  07/05/2021 2:22 PM    [facility]

## 2021-07-07 ENCOUNTER — Ambulatory Visit: Payer: Managed Care, Other (non HMO) | Admitting: Family Medicine

## 2021-07-07 ENCOUNTER — Ambulatory Visit: Payer: Managed Care, Other (non HMO) | Admitting: Medical

## 2021-07-07 ENCOUNTER — Encounter: Payer: Self-pay | Admitting: Family Medicine

## 2021-07-07 ENCOUNTER — Other Ambulatory Visit: Payer: Self-pay

## 2021-07-07 VITALS — BP 110/87 | HR 90 | Wt 209.0 lb

## 2021-07-07 DIAGNOSIS — I83813 Varicose veins of bilateral lower extremities with pain: Secondary | ICD-10-CM | POA: Diagnosis not present

## 2021-07-07 DIAGNOSIS — E118 Type 2 diabetes mellitus with unspecified complications: Secondary | ICD-10-CM | POA: Diagnosis not present

## 2021-07-07 DIAGNOSIS — I5020 Unspecified systolic (congestive) heart failure: Secondary | ICD-10-CM

## 2021-07-07 DIAGNOSIS — E1159 Type 2 diabetes mellitus with other circulatory complications: Secondary | ICD-10-CM | POA: Diagnosis not present

## 2021-07-07 DIAGNOSIS — M1A3710 Chronic gout due to renal impairment, right ankle and foot, without tophus (tophi): Secondary | ICD-10-CM

## 2021-07-07 DIAGNOSIS — I152 Hypertension secondary to endocrine disorders: Secondary | ICD-10-CM

## 2021-07-07 LAB — POCT GLYCOSYLATED HEMOGLOBIN (HGB A1C): Hemoglobin A1C: 7.6 % — AB (ref 4.0–5.6)

## 2021-07-07 MED ORDER — COLCHICINE 0.6 MG PO TABS: ORAL_TABLET | ORAL | 3 refills | Status: DC

## 2021-07-07 NOTE — Progress Notes (Signed)
Established patient visit   Patient: Keith [name]   DOB: 11-23-1970   50 y.o. Male  MRN: [id number] Visit Date: 07/07/2021  Today's healthcare provider: Vernie Murders, PA-C   Chief Complaint  Patient presents with   Hypertension   Diabetes   Subjective  -------------------------------------------------------------------------------------------------------------------- HPI  Diabetes Mellitus Type II, follow-up  Lab Results  Component Value Date   HGBA1C 7.6 (A) 07/07/2021   HGBA1C 7.3 (H) 03/24/2021   HGBA1C 11.3 (H) 10/25/2020   Last seen for diabetes 3 months ago.  Management since then includes continuing the same treatment. He reports excellent compliance with treatment. He is not having side effects.   Home blood sugar records:  are not beng checked  Episodes of hypoglycemia? No   Most Recent Eye Exam: Pt needs an appointment  --------------------------------------------------------------------------------------------------- Hypertension, follow-up  BP Readings from Last 3 Encounters:  07/07/21 110/87  03/28/21 122/90  03/24/21 (!) 156/89   Wt Readings from Last 3 Encounters:  07/07/21 209 lb (94.8 kg)  03/28/21 198 lb (89.8 kg)  03/24/21 205 lb (93 kg)      He reports excellent compliance with treatment. He is not having side effects.  He is exercising. He is adherent to low salt diet.   Outside blood pressures are not being checked.  He does smoke.  Use of agents associated with hypertension: none.    Symptoms: No appetite changes No foot ulcerations  No chest pain No chest pressure/discomfort  No dyspnea No orthopnea  No fatigue No lower extremity edema  No palpitations No paroxysmal nocturnal dyspnea  No nausea Yes numbness or tingling of extremity  No polydipsia No polyuria  No speech difficulty No syncope    Last metabolic panel Lab Results  Component Value Date   GLUCOSE 157 (H) 03/24/2021   NA 139 03/24/2021   K  4.4 03/24/2021   BUN 14 03/24/2021   CREATININE 0.84 03/24/2021   GFRNONAA >60 03/18/2021   GFRAA 93 10/25/2020   CALCIUM 9.4 03/24/2021   AST 17 03/24/2021   ALT 20 03/24/2021   The 10-year ASCVD risk score Mikey Bussing DC Jr., et al., 2013) is: 16.2%  --------------------------------------------------------------------------------------------------- Past Medical History:  Diagnosis Date   Chronic combined systolic (congestive) and diastolic (congestive) heart failure ([place])    a. 06/2017 Echo: EF 35-40%, Gr1 DD; b. 02/2018 Echo: Ef 30-35%; c. 03/2018 Echo: EF 35-40%, mid-apicalanteroseptal, ant, and apical HK. Mildly dil LA. No LV thrombus.   Diabetes mellitus    Erectile dysfunction    Essential hypertension    Frequent headaches    NICM (nonischemic cardiomyopathy) ([place])    a. 06/2017 Echo: EF 35-40%, Gr1 DD; b. 06/2017 Cath: nonobs dzs; c. 02/2018 Echo: Ef 30-35%; d. 03/2018 Echo: EF 35-40%, mid-apicalanteroseptal, ant, and apical HK. Mildly dil LA. No LV thrombus.   Non-obstructive Coronary Artery Disease    a. 06/2017 Cath: LM nl, LAD nl, D1/2/3 nl, LCX 30p, OM1 nl, OM2 min irregs, OM3/4 nl, RCA 30p/m, RPL2/3 nl.   TIA (transient ischemic attack)    a. 02/2018 MRA w/o acute findings; b. 02/2018 Carotis U/S: no hemodynamically significant stenoses.   Tobacco abuse    Past Surgical History:  Procedure Laterality Date   COLONOSCOPY WITH PROPOFOL N/A 02/19/2017   Procedure: COLONOSCOPY WITH PROPOFOL;  Surgeon: Jonathon Bellows, MD;  Location: [facility] ENDOSCOPY;  Service: Endoscopy;  Laterality: N/A;   ESOPHAGOGASTRODUODENOSCOPY (EGD) WITH PROPOFOL N/A 02/19/2017   Procedure: ESOPHAGOGASTRODUODENOSCOPY (EGD) WITH PROPOFOL;  Surgeon: Bailey Mech  Vicente Males, MD;  Location: [facility] ENDOSCOPY;  Service: Endoscopy;  Laterality: N/A;   LEFT HEART CATH AND CORONARY ANGIOGRAPHY N/A 07/11/2017   Procedure: LEFT HEART CATH AND CORONARY ANGIOGRAPHY;  Surgeon: Wellington Hampshire, MD;  Location: [facility];  Service:  Cardiovascular;  Laterality: N/A;   Family History  Problem Relation Age of Onset   Hypertension Mother    Diabetes Mother    Hypertension Father    Diabetes Father    Social History   Tobacco Use   Smoking status: Every Day    Packs/day: 1.00    Types: Cigarettes   Smokeless tobacco: Never  Vaping Use   Vaping Use: Never used  Substance Use Topics   Alcohol use: Yes    Comment: Occasionally    Drug use: No   Allergies  Allergen Reactions   Other Other (See Comments)    Muscle Relaxers (caused psychotic break) Other reaction(s): Other (See Comments) Muscle Relaxers (caused psychotic break)    Medications: Outpatient Medications Prior to Visit  Medication Sig   allopurinol (ZYLOPRIM) 100 MG tablet Take 1 tablet (100 mg total) by mouth daily.   aspirin 81 MG chewable tablet Chew 1 tablet (81 mg total) by mouth daily.   atorvastatin (LIPITOR) 40 MG tablet TAKE 1 TABLET BY MOUTH ONCE DAILY AT  6PM   Blood Glucose Monitoring Suppl (ONETOUCH VERIO) w/Device KIT Use daily to check blood glucose   carvedilol (COREG) 12.5 MG tablet Take 1 tablet (12.5 mg total) by mouth 2 (two) times daily.   colchicine 0.6 MG tablet Take 1 tablet (0.6 mg total) by mouth daily.   dapagliflozin propanediol (FARXIGA) 10 MG TABS tablet Take 1 tablet (10 mg total) by mouth daily before breakfast.   Dulaglutide (TRULICITY) 1.70 YF/7.4BS SOPN Inject 0.75 mg into the skin once a week.   furosemide (LASIX) 40 MG tablet Take 1 tablet by mouth once daily   gabapentin (NEURONTIN) 300 MG capsule Take 1 capsule (300 mg total) by mouth at bedtime.   glucose blood (ONETOUCH VERIO) test strip Use daily to check blood glucose   Lancets (ONETOUCH ULTRASOFT) lancets Use daily to check blood glucose   metFORMIN (GLUCOPHAGE) 500 MG tablet Take 2 tablets (1,000 mg total) by mouth 2 (two) times daily.   sacubitril-valsartan (ENTRESTO) 97-103 MG Take 1 tablet by mouth 2 (two) times daily.   sildenafil (VIAGRA) 50 MG  tablet Take 1 tablet (50 mg total) by mouth as needed for erectile dysfunction. May take 50-100 mg as needed   spironolactone (ALDACTONE) 25 MG tablet Take 1 tablet by mouth once daily   No facility-administered medications prior to visit.    Review of Systems  Constitutional: Negative.   Respiratory: Negative.    Cardiovascular: Negative.   Gastrointestinal: Negative.   Endocrine: Negative.   Skin:  Negative for wound.  Neurological:  Positive for numbness. Negative for dizziness, light-headedness and headaches.      Objective  -------------------------------------------------------------------------------------------------------------------- BP 110/87 (BP Location: Right Arm, Patient Position: Sitting, Cuff Size: Large)   Pulse 90   Wt 209 lb (94.8 kg)   SpO2 99%   BMI 29.15 kg/m     Physical Exam Constitutional:      General: He is not in acute distress.    Appearance: He is well-developed.  HENT:     Head: Normocephalic and atraumatic.     Right Ear: Hearing normal.     Left Ear: Hearing normal.     Nose: Nose normal.  Eyes:     General: Lids are normal. No scleral icterus.       Right eye: No discharge.        Left eye: No discharge.     Conjunctiva/sclera: Conjunctivae normal.  Cardiovascular:     Rate and Rhythm: Normal rate and regular rhythm.     Heart sounds: Normal heart sounds.  Pulmonary:     Effort: Pulmonary effort is normal. No respiratory distress.     Breath sounds: Normal breath sounds.  Abdominal:     General: Bowel sounds are normal.     Palpations: Abdomen is soft.  Musculoskeletal:        General: Normal range of motion.     Cervical back: Normal range of motion.     Comments: Tender lump in veins around right knee.  Skin:    Findings: No lesion or rash.  Neurological:     Mental Status: He is alert and oriented to person, place, and time.  Psychiatric:        Speech: Speech normal.        Behavior: Behavior normal.        Thought  Content: Thought content normal.      Results for orders placed or performed in visit on 07/07/21  POCT glycosylated hemoglobin (Hb A1C)  Result Value Ref Range   Hemoglobin A1C 7.6 (A) 4.0 - 5.6 %    Assessment & Plan  ---------------------------------------------------------------------------------------------------------------------- 1. Hypertension associated with diabetes ([place]) Very good control of BP with Carvedilol 12.5 mg BID and Lasix 40 mg  mg qd. Maintain follow up with cardiologist (Dr. Rockey Situ).  2. DM (diabetes mellitus) with complications ([facility]) Hgb Q9U was 7.6 today. Continues Metformin, Trulicity, Farxiga with Atorvastatin and Entresto (sacubitril-valsartan). Watch diet closer and recheck with new PCP in 3 months. - POCT glycosylated hemoglobin (Hb A1C)  3. Varicose veins of bilateral lower extremities with pain Very tender lumps in veins medially and laterally to the upper calf. No significant swelling today. Wants to have vascular evaluation with concern of a clot formation. Suspect phlebitis. - Ambulatory referral to Vascular Surgery  4. 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 Still on the allopurinol 100 mg qd and uses Colchicine prn attacks.. Podiatrist still treating tendinitis of the right foot with a ice pack prn. - colchicine 0.6 MG tablet; Take 1 tablet (0.6 mg total) by mouth daily prn gout attack.  Dispense: 30 tablet; Refill: 3  5. HFrEF (heart failure with reduced ejection fraction) ([place]) Has follow up planned with Dr. Rockey Situ today.   No follow-ups on file.      I, Selassie Spatafore, PA-C, have reviewed all documentation for this visit. The documentation on 07/07/21 for the exam, diagnosis, procedures, and orders are all accurate and complete.    Vernie Murders, PA-C  [employer] [phone] (phone) [phone] (fax)  [place]

## 2021-08-04 ENCOUNTER — Ambulatory Visit: Payer: Managed Care, Other (non HMO) | Admitting: Physician Assistant

## 2021-08-08 ENCOUNTER — Telehealth (INDEPENDENT_AMBULATORY_CARE_PROVIDER_SITE_OTHER): Payer: Self-pay | Admitting: Vascular Surgery

## 2021-08-08 ENCOUNTER — Other Ambulatory Visit (INDEPENDENT_AMBULATORY_CARE_PROVIDER_SITE_OTHER): Payer: Self-pay | Admitting: Nurse Practitioner

## 2021-08-08 DIAGNOSIS — I83813 Varicose veins of bilateral lower extremities with pain: Secondary | ICD-10-CM

## 2021-08-10 DIAGNOSIS — I872 Venous insufficiency (chronic) (peripheral): Secondary | ICD-10-CM | POA: Insufficient documentation

## 2021-08-10 DIAGNOSIS — I83819 Varicose veins of unspecified lower extremities with pain: Secondary | ICD-10-CM | POA: Insufficient documentation

## 2021-08-10 NOTE — Progress Notes (Deleted)
MRN : [id number]  Keith [name] is a 50 y.o. (Jul 03, 1971) male who presents with chief complaint of vein troubles.  History of Present Illness:   The patient is seen for evaluation of symptomatic varicose veins. The patient relates burning and stinging which worsened steadily throughout the course of the day, particularly with standing. The patient also notes an aching and throbbing pain over the varicosities, particularly with prolonged dependent positions. The symptoms are significantly improved with elevation.  The patient also notes that during hot weather the symptoms are greatly intensified. The patient states the pain from the varicose veins interferes with work, daily exercise, shopping and household maintenance. At this point, the symptoms are persistent and severe enough that they're having a negative impact on lifestyle and are interfering with daily activities.  There is no history of DVT, PE or superficial thrombophlebitis. There is no history of ulceration or hemorrhage. The patient denies a significant family history of varicose veins.  The patient has not worn graduated compression in the past. At the present time the patient has not been using over-the-counter analgesics. There is no history of prior surgical intervention or sclerotherapy.    No outpatient medications have been marked as taking for the 08/11/21 encounter (Appointment) with Gilda Crease, Latina Craver, MD.    Past Medical History:  Diagnosis Date   Chronic combined systolic (congestive) and diastolic (congestive) heart failure ([facility])    a. 06/2017 Echo: EF 35-40%, Gr1 DD; b. 02/2018 Echo: Ef 30-35%; c. 03/2018 Echo: EF 35-40%, mid-apicalanteroseptal, ant, and apical HK. Mildly dil LA. No LV thrombus.   Diabetes mellitus    Erectile dysfunction    Essential hypertension    Frequent headaches    NICM (nonischemic cardiomyopathy) ([facility])    a. 06/2017 Echo: EF 35-40%, Gr1 DD; b. 06/2017 Cath: nonobs dzs; c. 02/2018  Echo: Ef 30-35%; d. 03/2018 Echo: EF 35-40%, mid-apicalanteroseptal, ant, and apical HK. Mildly dil LA. No LV thrombus.   Non-obstructive Coronary Artery Disease    a. 06/2017 Cath: LM nl, LAD nl, D1/2/3 nl, LCX 30p, OM1 nl, OM2 min irregs, OM3/4 nl, RCA 30p/m, RPL2/3 nl.   TIA (transient ischemic attack)    a. 02/2018 MRA w/o acute findings; b. 02/2018 Carotis U/S: no hemodynamically significant stenoses.   Tobacco abuse     Past Surgical History:  Procedure Laterality Date   COLONOSCOPY WITH PROPOFOL N/A 02/19/2017   Procedure: COLONOSCOPY WITH PROPOFOL;  Surgeon: [name], MD;  Location: [facility] ENDOSCOPY;  Service: Endoscopy;  Laterality: N/A;   ESOPHAGOGASTRODUODENOSCOPY (EGD) WITH PROPOFOL N/A 02/19/2017   Procedure: ESOPHAGOGASTRODUODENOSCOPY (EGD) WITH PROPOFOL;  Surgeon: [name], MD;  Location: [facility] ENDOSCOPY;  Service: Endoscopy;  Laterality: N/A;   LEFT HEART CATH AND CORONARY ANGIOGRAPHY N/A 07/11/2017   Procedure: LEFT HEART CATH AND CORONARY ANGIOGRAPHY;  Surgeon: [name], MD;  Location: [facility];  Service: Cardiovascular;  Laterality: N/A;    Social History Social History   Tobacco Use   Smoking status: Every Day    Packs/day: 1.00    Types: Cigarettes   Smokeless tobacco: Never  Vaping Use   Vaping Use: Never used  Substance Use Topics   Alcohol use: Yes    Comment: Occasionally    Drug use: No    Family History Family History  Problem Relation Age of Onset   Hypertension Mother    Diabetes Mother    Hypertension Father    Diabetes Father     Allergies  Allergen  Reactions   Other Other (See Comments)    Muscle Relaxers (caused psychotic break) Other reaction(s): Other (See Comments) Muscle Relaxers (caused psychotic break)     REVIEW OF SYSTEMS (Negative unless checked)  Constitutional: [] Weight loss  [] Fever  [] Chills Cardiac: [] Chest pain   [] Chest pressure   [] Palpitations   [] Shortness of breath when laying flat   [] Shortness  of breath with exertion. Vascular:  [] Pain in legs with walking   [] Pain in legs at rest  [] History of DVT   [] Phlebitis   [x] Swelling in legs   [] Varicose veins   [] Non-healing ulcers Pulmonary:   [] Uses home oxygen   [] Productive cough   [] Hemoptysis   [] Wheeze  [] COPD   [] Asthma Neurologic:  [] Dizziness   [] Seizures   [] History of stroke   [] History of TIA  [] Aphasia   [] Vissual changes   [] Weakness or numbness in arm   [] Weakness or numbness in leg Musculoskeletal:   [] Joint swelling   [] Joint pain   [] Low back pain Hematologic:  [] Easy bruising  [] Easy bleeding   [] Hypercoagulable state   [] Anemic Gastrointestinal:  [] Diarrhea   [] Vomiting  [] Gastroesophageal reflux/heartburn   [] Difficulty swallowing. Genitourinary:  [] Chronic kidney disease   [] Difficult urination  [] Frequent urination   [] Blood in urine Skin:  [] Rashes   [] Ulcers  Psychological:  [] History of anxiety   []  History of major depression.  Physical Examination  There were no vitals filed for this visit. There is no height or weight on file to calculate BMI. Gen: WD/WN, NAD Head: [place]/AT, No temporalis wasting.  Ear/Nose/Throat: Hearing grossly intact, nares w/o erythema or drainage, pinna without lesions Eyes: PER, EOMI, sclera nonicteric.  Neck: Supple, no gross masses.  No JVD.  Pulmonary:  Good air movement, no audible wheezing, no use of accessory muscles.  Cardiac: RRR, precordium not hyperdynamic. Vascular:  scattered varicosities present bilaterally.  Mild venous stasis changes to the legs bilaterally.  2+ soft pitting edema  Vessel Right Left  Radial Palpable Palpable  Gastrointestinal: soft, non-distended. No guarding/no peritoneal signs.  Musculoskeletal: M/S 5/5 throughout.  No deformity.  Neurologic: CN 2-12 intact. Pain and light touch intact in extremities.  Symmetrical.  Speech is fluent. Motor exam as listed above. Psychiatric: Judgment intact, Mood & affect appropriate for pt's clinical  situation. Dermatologic: Venous rashes no ulcers noted.  No changes consistent with cellulitis. Lymph : No lichenification or skin changes of chronic lymphedema.  CBC Lab Results  Component Value Date   WBC 6.2 03/24/2021   HGB 16.2 03/24/2021   HCT 47.3 03/24/2021   MCV 85 03/24/2021   PLT 212 03/24/2021    BMET    Component Value Date/Time   NA 139 03/24/2021 0955   K 4.4 03/24/2021 0955   CL 100 03/24/2021 0955   CO2 22 03/24/2021 0955   GLUCOSE 157 (H) 03/24/2021 0955   GLUCOSE 154 (H) 03/18/2021 1946   BUN 14 03/24/2021 0955   CREATININE 0.84 03/24/2021 0955   CREATININE 0.72 12/14/2016 0834   CALCIUM 9.4 03/24/2021 0955   GFRNONAA >60 03/18/2021 1946   GFRNONAA >89 12/14/2016 0834   GFRAA 93 10/25/2020 1009   GFRAA >89 12/14/2016 0834   CrCl cannot be calculated (Patient's most recent lab result is older than the maximum 21 days allowed.).  COAG Lab Results  Component Value Date   INR 0.94 03/02/2018   INR 0.92 07/11/2017   INR 0.99 01/23/2015    Radiology No results found.   Assessment/Plan There are no  diagnoses linked to this encounter.   Levora Dredge, MD  08/10/2021 2:15 PM

## 2021-08-11 ENCOUNTER — Encounter (INDEPENDENT_AMBULATORY_CARE_PROVIDER_SITE_OTHER): Payer: Self-pay | Admitting: Vascular Surgery

## 2021-08-11 ENCOUNTER — Encounter (INDEPENDENT_AMBULATORY_CARE_PROVIDER_SITE_OTHER): Payer: Managed Care, Other (non HMO) | Admitting: Vascular Surgery

## 2021-08-11 ENCOUNTER — Encounter (INDEPENDENT_AMBULATORY_CARE_PROVIDER_SITE_OTHER): Payer: Managed Care, Other (non HMO)

## 2021-08-11 DIAGNOSIS — E118 Type 2 diabetes mellitus with unspecified complications: Secondary | ICD-10-CM

## 2021-08-11 DIAGNOSIS — I83819 Varicose veins of unspecified lower extremities with pain: Secondary | ICD-10-CM

## 2021-08-11 DIAGNOSIS — I872 Venous insufficiency (chronic) (peripheral): Secondary | ICD-10-CM

## 2021-08-11 DIAGNOSIS — I25118 Atherosclerotic heart disease of native coronary artery with other forms of angina pectoris: Secondary | ICD-10-CM

## 2021-08-11 DIAGNOSIS — E1159 Type 2 diabetes mellitus with other circulatory complications: Secondary | ICD-10-CM

## 2021-08-20 ENCOUNTER — Other Ambulatory Visit: Payer: Self-pay | Admitting: Physician Assistant

## 2021-08-20 DIAGNOSIS — I5021 Acute systolic (congestive) heart failure: Secondary | ICD-10-CM

## 2021-08-20 DIAGNOSIS — E1159 Type 2 diabetes mellitus with other circulatory complications: Secondary | ICD-10-CM

## 2021-08-20 DIAGNOSIS — I152 Hypertension secondary to endocrine disorders: Secondary | ICD-10-CM

## 2021-08-26 NOTE — Telephone Encounter (Signed)
[facility] faxed refill request for the following medications:   atorvastatin (LIPITOR) 40 MG tablet   Please advise.  

## 2021-09-15 ENCOUNTER — Encounter (INDEPENDENT_AMBULATORY_CARE_PROVIDER_SITE_OTHER): Payer: Managed Care, Other (non HMO)

## 2021-09-15 ENCOUNTER — Encounter (INDEPENDENT_AMBULATORY_CARE_PROVIDER_SITE_OTHER): Payer: Managed Care, Other (non HMO) | Admitting: Vascular Surgery

## 2021-09-22 ENCOUNTER — Telehealth: Payer: Self-pay | Admitting: Family Medicine

## 2021-09-22 DIAGNOSIS — I152 Hypertension secondary to endocrine disorders: Secondary | ICD-10-CM

## 2021-09-22 MED ORDER — ATORVASTATIN CALCIUM 40 MG PO TABS: ORAL_TABLET | ORAL | 0 refills | Status: DC

## 2021-09-22 NOTE — Telephone Encounter (Signed)
[facility] faxed refill request for the following medications:   atorvastatin (LIPITOR) 40 MG tablet   Please advise.  

## 2021-09-24 NOTE — Progress Notes (Deleted)
Cardiology Office Note    Date:  09/24/2021   ID:  Keith [name], DOB [birth year], MRN [id number]  PCP:  [facility]  Cardiologist:  Julien Nordmann, MD  Electrophysiologist:  None   Chief Complaint: Follow up  History of Present Illness:   Keith [name] is a 50 y.o. male with history of NICM, chronic combined systolic and diastolic CHF, paroxysmal SVT, poorly controlled diabetes, HTN, HLD, tobacco absue, ED who presents for follow up of ***.    He was admitted to the hospital in 06/2017 with dyspnea and hypertensive urgency.  EF was 35 to 40%.  Diagnostic cardiac cath revealed nonobstructive CAD.  Medical therapy was recommended.  He was unable to afford Entresto and was maintained on beta-blocker, ARB, spironolactone, and Lasix.  Following that, he was lost to follow-up and was readmitted in 02/2018 with slurred speech and TIA.  Echo during that admission showed an EF of 30 to 35%.  LV thrombus could not be ruled out, however outpatient follow-up echo with Definity did not show any evidence of LV thrombus.  EF was 35 to 40% on the follow-up outpatient echo in 03/2018.  In follow-up in 08/2019, he had self discontinued all of his medications several months prior.  In follow-up in 09/2020, he had discontinued carvedilol due to possible interaction with colchicine, it was recommended this be resumed.  Follow-up echo was also recommended, though remains pending.  He was most recently seen in 03/2021 with palpitations and possible presyncope earlier that morning.  He was without chest pain.  He further reported a several week history of palpitations.  It was noted his appetite was low and he was not having much intake.  Outpatient cardiac monitoring showed sinus rhythm with an average heart rate of 87 bpm, bundle branch block/IVCD was present, 1547 episodes of SVT with the longest interval lasting 7 minutes and 54 seconds, SVT was detected with symptomatic patient events.  It was  recommended he take carvedilol twice daily as directed.  It was noted he had not been taking carvedilol twice daily, with patient reporting at the direction of outside provider.  Subsequent echo demonstrated a slight improvement in his LV systolic function with an EF of 40 to 45%, global hypokinesis, mild LVH, normal RV systolic function and ventricular cavity size, no significant valvular abnormalities, borderline dilatation of the ascending aorta measuring 36 mm, and an estimated right atrial pressure of 3 mmHg.  ***   Labs independently reviewed: 06/2021 - A1c 7.6 03/2021 - TSH normal, BUN 14, serum creatinine 0.84, potassium 4.4, albumin 4.5, AST/ALT normal, Hgb 16.2, PLT 212 07/2019 - TC 171, TG 255, HDL 32, LDL 96  Past Medical History:  Diagnosis Date   Chronic combined systolic (congestive) and diastolic (congestive) heart failure (HCC)    a. 06/2017 Echo: EF 35-40%, Gr1 DD; b. 02/2018 Echo: Ef 30-35%; c. 03/2018 Echo: EF 35-40%, mid-apicalanteroseptal, ant, and apical HK. Mildly dil LA. No LV thrombus.   Diabetes mellitus    Erectile dysfunction    Essential hypertension    Frequent headaches    NICM (nonischemic cardiomyopathy) (HCC)    a. 06/2017 Echo: EF 35-40%, Gr1 DD; b. 06/2017 Cath: nonobs dzs; c. 02/2018 Echo: Ef 30-35%; d. 03/2018 Echo: EF 35-40%, mid-apicalanteroseptal, ant, and apical HK. Mildly dil LA. No LV thrombus.   Non-obstructive Coronary Artery Disease    a. 06/2017 Cath: LM nl, LAD nl, D1/2/3 nl, LCX 30p, OM1 nl, OM2 min irregs, OM3/4 nl,  RCA 30p/m, RPL2/3 nl.   TIA (transient ischemic attack)    a. 02/2018 MRA w/o acute findings; b. 02/2018 Carotis U/S: no hemodynamically significant stenoses.   Tobacco abuse     Past Surgical History:  Procedure Laterality Date   COLONOSCOPY WITH PROPOFOL N/A 02/19/2017   Procedure: COLONOSCOPY WITH PROPOFOL;  Surgeon: Jonathon Bellows, MD;  Location: [facility];  Service: Endoscopy;  Laterality: N/A;   ESOPHAGOGASTRODUODENOSCOPY (EGD)  WITH PROPOFOL N/A 02/19/2017   Procedure: ESOPHAGOGASTRODUODENOSCOPY (EGD) WITH PROPOFOL;  Surgeon: Jonathon Bellows, MD;  Location: [facility]COPY;  Service: Endoscopy;  Laterality: N/A;   LEFT HEART CATH AND CORONARY ANGIOGRAPHY N/A 07/11/2017   Procedure: LEFT HEART CATH AND CORONARY ANGIOGRAPHY;  Surgeon: Wellington Hampshire, MD;  Location: [facility];  Service: Cardiovascular;  Laterality: N/A;    Current Medications: No outpatient medications have been marked as taking for the 09/29/21 encounter (Appointment) with Rise Mu, PA-C.    Allergies:   Other   Social History   Socioeconomic History   Marital status: Married    Spouse name: Not on file   Number of children: Not on file   Years of education: Not on file   Highest education level: Not on file  Occupational History   Not on file  Tobacco Use   Smoking status: Every Day    Packs/day: 1.00    Types: Cigarettes   Smokeless tobacco: Never  Vaping Use   Vaping Use: Never used  Substance and Sexual Activity   Alcohol use: Yes    Comment: Occasionally    Drug use: No   Sexual activity: Yes  Other Topics Concern   Not on file  Social History Narrative   Not on file   Social Determinants of Health   Financial Resource Strain: Not on file  Food Insecurity: Not on file  Transportation Needs: Not on file  Physical Activity: Not on file  Stress: Not on file  Social Connections: Not on file     Family History:  The patient's family history includes Diabetes in his father and mother; Hypertension in his father and mother.  ROS:   ROS   EKGs/Labs/Other Studies Reviewed:    Studies reviewed were summarized above. The additional studies were reviewed today:  2D echo 04/2021: 1. Left ventricular ejection fraction, by estimation, is 40 to 45%. The  left ventricle has mildly decreased function. The left ventricle  demonstrates global hypokinesis. Regional wall motional abnormalities  cannot be excluded. There  is mild left  ventricular hypertrophy. Left ventricular diastolic parameters are  indeterminate. The average left ventricular global longitudinal strain is  -10.4 %. The global longitudinal strain is abnormal.   2. Right ventricular systolic function is normal. The right ventricular  size is normal.   3. The mitral valve is grossly normal. No evidence of mitral valve  regurgitation. No evidence of mitral stenosis.   4. The aortic valve has an indeterminant number of cusps. Aortic valve  regurgitation is not visualized. No aortic stenosis is present.   5. There is borderline dilatation of the ascending aorta, measuring 36  mm.   6. The inferior vena cava is normal in size with greater than 50%  respiratory variability, suggesting right atrial pressure of 3 mmHg.   Comparison(s): Previous Echo showed LV EF 35-40%, moderately reduced  systolic function, HK of the mid-apical anteroseptal, anterior and apical  myocardium.  __________  [facility] Reach patch 03/2021: Normal sinus rhythm Patient had a min HR of 53  bpm, max HR of 200 bpm, and avg HR of 87 bpm.  Bundle Branch Block/IVCD was present. QRS morphology changes were present throughout recording.    1547 Supraventricular Tachycardia/atrtial tachycardia runs occurred, the run with the fastest interval lasting 14 beats with a max rate of 200 bpm, the longest lasting 7 mins 54 secs with an avg rate of 118 bpm.  Supraventricular Tachycardia was detected within +/- 45 seconds of symptomatic patient event(s).    Isolated SVEs were rare (<1.0%), SVE Couplets were rare (<1.0%), and SVE Triplets were rare (<1.0%).  Isolated VEs were rare (<1.0%), and no VE Couplets or VE Triplets were present. __________  Limited echo 03/2018: - Procedure narrative: LIMITED TTE to assess for LV apical thrombus    and LVF.  - Left ventricle: The cavity size was normal. Systolic function was    moderately reduced. The estimated ejection fraction was in the    range of  35% to 40%. Hypokinesis of the mid-apicalanteroseptal,    anterior, and apical myocardium.  - Left atrium: The atrium was mildly dilated.   Impressions:   - No LV thrombus.  __________  2D echo 02/2018: - Left ventricle: The cavity size was mildly dilated. Systolic    function was moderately to severely reduced. The estimated    ejection fraction was in the range of 30% to 35%. Hypokinesis of    the anterior myocardium. Hypokinesis of the anteroseptal    myocardium. Hypokinesis of the apical myocardium.  - Mitral valve: There was mild regurgitation.  - Left atrium: The atrium was mildly dilated.  - Right ventricle: Systolic function was normal.  - Pulmonary arteries: Systolic pressure was mild to moderately    elevated PA peak pressure: 44 mm Hg (S).   Impressions:   - Unable to exclude apical thrombus. Severe apical hypokinesis, if    not akinesis. Consider imaging of apical region with definity if    clinically indicated.  __________  [facility] 06/2017: Prox Cx lesion, 30 %stenosed. Prox RCA lesion, 30 %stenosed. Mid RCA to Dist RCA lesion, 30 %stenosed. There is moderate to severe left ventricular systolic dysfunction. LV end diastolic pressure is moderately elevated. There is trivial (1+) mitral regurgitation.   1. Mild nonobstructive coronary artery disease. 2. Moderately to severely reduced LV systolic function with an EF of 30-35% likely due to hypertensive heart disease. 3. Moderately elevated left ventricular end-diastolic pressure. 4. Aortic root angiography showed no evidence of ascending aortic aneurysm or dissection.   Recommendations: I suspect that the patient's symptoms are likely due to acute systolic heart failure due to hypertensive heart disease with new left bundle branch block. There is no culprit for unstable angina. We need to start him on heart failure medications. I initiated treatment with carvedilol and Entresto (the patient tolerated losartan in the  past). Gentle diuresis with oral furosemide. Smoking cessation is advised.  __________   2D echo 06/2017: - Procedure narrative: Transthoracic echocardiography. Image   quality was poor. The study was technically difficult, as a   result of poor acoustic windows and poor sound wave transmission.   Intravenous contrast (Definity) was administered. - Left ventricle: The cavity size was mildly dilated. There was   mild concentric hypertrophy. Systolic function was moderately   reduced. The estimated ejection fraction was in the range of 35%   to 40%. Diffuse hypokinesis. Features are consistent with a   pseudonormal left ventricular filling pattern, with concomitant   abnormal relaxation and increased filling pressure (grade  2   diastolic dysfunction). - Left atrium: The atrium was mildly dilated. __________   2D echo 02/2017: - Left ventricle: The cavity size was mildly dilated. Systolic   function was moderately to severely reduced. The estimated   ejection fraction was in the range of 30% to 35%. Hypokinesis of   the anterior myocardium. Hypokinesis of the anteroseptal   myocardium. Hypokinesis of the apical myocardium. - Mitral valve: There was mild regurgitation. - Left atrium: The atrium was mildly dilated. - Right ventricle: Systolic function was normal. - Pulmonary arteries: Systolic pressure was mild to moderately   elevated PA peak pressure: 44 mm Hg (S).   Impressions:   - Unable to exclude apical thrombus. Severe apical hypokinesis, if   not akinesis. Consider imaging of apical region with definity if   clinically indicated. __________   Limited 2D echo 03/2018: - Procedure narrative: LIMITED TTE to assess for LV apical thrombus   and LVF. - Left ventricle: The cavity size was normal. Systolic function was   moderately reduced. The estimated ejection fraction was in the   range of 35% to 40%. Hypokinesis of the mid-apicalanteroseptal,   anterior, and apical  myocardium. - Left atrium: The atrium was mildly dilated.   Impressions:   - No LV thrombus.   EKG:  EKG is ordered today.  The EKG ordered today demonstrates ***  Recent Labs: 03/24/2021: ALT 20; BUN 14; Creatinine, Ser 0.84; Hemoglobin 16.2; Platelets 212; Potassium 4.4; Sodium 139; TSH 2.960  Recent Lipid Panel    Component Value Date/Time   CHOL 171 08/09/2019 0933   TRIG 255 (H) 08/09/2019 0933   HDL 32 (L) 08/09/2019 0933   CHOLHDL 5.3 (H) 08/09/2019 0933   CHOLHDL 4.3 03/03/2018 0610   VLDL 33 03/03/2018 0610   LDLCALC 96 08/09/2019 0933    PHYSICAL EXAM:    VS:  There were no vitals taken for this visit.  BMI: There is no height or weight on file to calculate BMI.  Physical Exam  Wt Readings from Last 3 Encounters:  07/07/21 209 lb (94.8 kg)  03/28/21 198 lb (89.8 kg)  03/24/21 205 lb (93 kg)     ASSESSMENT & PLAN:   Chronic combined systolic and diastolic CHF secondary to NICM:  Paroxysmal SVT:  HTN: Blood pressure ***  HLD: LDL 96.  ED: Defer any refills of sildenafil to prescribing provider.  Disposition: F/u with Dr. Rockey Situ or an APP in ***.   Medication Adjustments/Labs and Tests Ordered: Current medicines are reviewed at length with the patient today.  Concerns regarding medicines are outlined above. Medication changes, Labs and Tests ordered today are summarized above and listed in the Patient Instructions accessible in Encounters.   Signed, Christell Faith, PA-C 09/24/2021 11:11 AM     [facility] [address], [place] [address] [phone]

## 2021-09-29 ENCOUNTER — Ambulatory Visit: Payer: Managed Care, Other (non HMO) | Admitting: Physician Assistant

## 2021-09-30 ENCOUNTER — Encounter: Payer: Self-pay | Admitting: Physician Assistant

## 2021-09-30 NOTE — Telephone Encounter (Signed)
LVM to schedule

## 2021-10-06 ENCOUNTER — Other Ambulatory Visit: Payer: Self-pay | Admitting: Physician Assistant

## 2021-10-06 ENCOUNTER — Ambulatory Visit: Payer: Managed Care, Other (non HMO) | Admitting: Family Medicine

## 2021-10-06 DIAGNOSIS — E1122 Type 2 diabetes mellitus with diabetic chronic kidney disease: Secondary | ICD-10-CM

## 2021-10-06 DIAGNOSIS — E118 Type 2 diabetes mellitus with unspecified complications: Secondary | ICD-10-CM

## 2021-10-20 ENCOUNTER — Other Ambulatory Visit: Payer: Self-pay | Admitting: Cardiovascular Disease

## 2021-10-20 ENCOUNTER — Encounter (INDEPENDENT_AMBULATORY_CARE_PROVIDER_SITE_OTHER): Payer: Managed Care, Other (non HMO) | Admitting: Vascular Surgery

## 2021-10-20 ENCOUNTER — Encounter (INDEPENDENT_AMBULATORY_CARE_PROVIDER_SITE_OTHER): Payer: Managed Care, Other (non HMO)

## 2021-10-22 NOTE — Progress Notes (Deleted)
Cardiology Office Note    Date:  10/22/2021   ID:  Keith [name], DOB 1971/10/28, MRN [id number]  PCP:  [facility]  Cardiologist:  Ida Rogue, MD  Electrophysiologist:  None   Chief Complaint: Follow up  History of Present Illness:   Keith [name] is a 50 y.o. male with history of NICM, chronic combined systolic and diastolic CHF, paroxysmal SVT, poorly controlled diabetes, HTN, HLD, tobacco absue, ED who presents for follow up of ***.    He was admitted to the hospital in 06/2017 with dyspnea and hypertensive urgency.  EF was 35 to 40%.  Diagnostic cardiac cath revealed nonobstructive CAD.  Medical therapy was recommended.  He was unable to afford Entresto and was maintained on beta-blocker, ARB, spironolactone, and Lasix.  Following that, he was lost to follow-up and was readmitted in 02/2018 with slurred speech and TIA.  Echo during that admission showed an EF of 30 to 35%.  LV thrombus could not be ruled out, however outpatient follow-up echo with Definity did not show any evidence of LV thrombus.  EF was 35 to 40% on the follow-up outpatient echo in 03/2018.  In follow-up in 08/2019, he had self discontinued all of his medications several months prior.  In follow-up in 09/2020, he had discontinued carvedilol due to possible interaction with colchicine, it was recommended this be resumed.  Follow-up echo was also recommended, though remains pending.  He was most recently seen in 03/2021 with palpitations and possible presyncope earlier that morning.  He was without chest pain.  He further reported a several week history of palpitations.  It was noted his appetite was low and he was not having much intake.  Outpatient cardiac monitoring showed sinus rhythm with an average heart rate of 87 bpm, bundle branch block/IVCD was present, 1547 episodes of SVT with the longest interval lasting 7 minutes and 54 seconds, SVT was detected with symptomatic patient events.  It was  recommended he take carvedilol twice daily as directed.  It was noted he had not been taking carvedilol twice daily, with patient reporting at the direction of outside provider.  Subsequent echo demonstrated a slight improvement in his LV systolic function with an EF of 40 to 45%, global hypokinesis, mild LVH, normal RV systolic function and ventricular cavity size, no significant valvular abnormalities, borderline dilatation of the ascending aorta measuring 36 mm, and an estimated right atrial pressure of 3 mmHg.  ***   Labs independently reviewed: 06/2021 - A1c 7.6 03/2021 - TSH normal, BUN 14, serum creatinine 0.84, potassium 4.4, albumin 4.5, AST/ALT normal, Hgb 16.2, PLT 212 07/2019 - TC 171, TG 255, HDL 32, LDL 96    Past Medical History:  Diagnosis Date   Chronic combined systolic (congestive) and diastolic (congestive) heart failure ([place])    a. 06/2017 Echo: EF 35-40%, Gr1 DD; b. 02/2018 Echo: Ef 30-35%; c. 03/2018 Echo: EF 35-40%, mid-apicalanteroseptal, ant, and apical HK. Mildly dil LA. No LV thrombus.   Diabetes mellitus    Erectile dysfunction    Essential hypertension    Frequent headaches    NICM (nonischemic cardiomyopathy) ([place])    a. 06/2017 Echo: EF 35-40%, Gr1 DD; b. 06/2017 Cath: nonobs dzs; c. 02/2018 Echo: Ef 30-35%; d. 03/2018 Echo: EF 35-40%, mid-apicalanteroseptal, ant, and apical HK. Mildly dil LA. No LV thrombus.   Non-obstructive Coronary Artery Disease    a. 06/2017 Cath: LM nl, LAD nl, D1/2/3 nl, LCX 30p, OM1 nl, OM2 min irregs,  OM3/4 nl, RCA 30p/m, RPL2/3 nl.   TIA (transient ischemic attack)    a. 02/2018 MRA w/o acute findings; b. 02/2018 Carotis U/S: no hemodynamically significant stenoses.   Tobacco abuse     Past Surgical History:  Procedure Laterality Date   COLONOSCOPY WITH PROPOFOL N/A 02/19/2017   Procedure: COLONOSCOPY WITH PROPOFOL;  Surgeon: [name], MD;  Location: [facility] ENDOSCOPY;  Service: Endoscopy;  Laterality: N/A;   ESOPHAGOGASTRODUODENOSCOPY (EGD)  WITH PROPOFOL N/A 02/19/2017   Procedure: ESOPHAGOGASTRODUODENOSCOPY (EGD) WITH PROPOFOL;  Surgeon: [name], MD;  Location: [facility] ENDOSCOPY;  Service: Endoscopy;  Laterality: N/A;   LEFT HEART CATH AND CORONARY ANGIOGRAPHY N/A 07/11/2017   Procedure: LEFT HEART CATH AND CORONARY ANGIOGRAPHY;  Surgeon: [name], MD;  Location: [facility] INVASIVE CV LAB;  Service: Cardiovascular;  Laterality: N/A;    Current Medications: No outpatient medications have been marked as taking for the 10/27/21 encounter (Appointment) with Sondra Barges, PA-C.    Allergies:   Other   Social History   Socioeconomic History   Marital status: Married    Spouse name: Not on file   Number of children: Not on file   Years of education: Not on file   Highest education level: Not on file  Occupational History   Not on file  Tobacco Use   Smoking status: Every Day    Packs/day: 1.00    Types: Cigarettes   Smokeless tobacco: Never  Vaping Use   Vaping Use: Never used  Substance and Sexual Activity   Alcohol use: Yes    Comment: Occasionally    Drug use: No   Sexual activity: Yes  Other Topics Concern   Not on file  Social History Narrative   Not on file   Social Determinants of Health   Financial Resource Strain: Not on file  Food Insecurity: Not on file  Transportation Needs: Not on file  Physical Activity: Not on file  Stress: Not on file  Social Connections: Not on file     Family History:  The patient's family history includes Diabetes in his father and mother; Hypertension in his father and mother.  ROS:   ROS   EKGs/Labs/Other Studies Reviewed:    Studies reviewed were summarized above. The additional studies were reviewed today:  2D echo 04/2021: 1. Left ventricular ejection fraction, by estimation, is 40 to 45%. The  left ventricle has mildly decreased function. The left ventricle  demonstrates global hypokinesis. Regional wall motional abnormalities  cannot be excluded. There  is mild left  ventricular hypertrophy. Left ventricular diastolic parameters are  indeterminate. The average left ventricular global longitudinal strain is  -10.4 %. The global longitudinal strain is abnormal.   2. Right ventricular systolic function is normal. The right ventricular  size is normal.   3. The mitral valve is grossly normal. No evidence of mitral valve  regurgitation. No evidence of mitral stenosis.   4. The aortic valve has an indeterminant number of cusps. Aortic valve  regurgitation is not visualized. No aortic stenosis is present.   5. There is borderline dilatation of the ascending aorta, measuring 36  mm.   6. The inferior vena cava is normal in size with greater than 50%  respiratory variability, suggesting right atrial pressure of 3 mmHg.   Comparison(s): Previous Echo showed LV EF 35-40%, moderately reduced  systolic function, HK of the mid-apical anteroseptal, anterior and apical  myocardium.  __________  Luci Bank patch 03/2021: Normal sinus rhythm Patient had a min HR  of 53 bpm, max HR of 200 bpm, and avg HR of 87 bpm.  Bundle Branch Block/IVCD was present. QRS morphology changes were present throughout recording.    1547 Supraventricular Tachycardia/atrtial tachycardia runs occurred, the run with the fastest interval lasting 14 beats with a max rate of 200 bpm, the longest lasting 7 mins 54 secs with an avg rate of 118 bpm.  Supraventricular Tachycardia was detected within +/- 45 seconds of symptomatic patient event(s).    Isolated SVEs were rare (<1.0%), SVE Couplets were rare (<1.0%), and SVE Triplets were rare (<1.0%).  Isolated VEs were rare (<1.0%), and no VE Couplets or VE Triplets were present. __________  Limited echo 03/2018: - Procedure narrative: LIMITED TTE to assess for LV apical thrombus    and LVF.  - Left ventricle: The cavity size was normal. Systolic function was    moderately reduced. The estimated ejection fraction was in the    range of  35% to 40%. Hypokinesis of the mid-apicalanteroseptal,    anterior, and apical myocardium.  - Left atrium: The atrium was mildly dilated.   Impressions:   - No LV thrombus.  __________  2D echo 02/2018: - Left ventricle: The cavity size was mildly dilated. Systolic    function was moderately to severely reduced. The estimated    ejection fraction was in the range of 30% to 35%. Hypokinesis of    the anterior myocardium. Hypokinesis of the anteroseptal    myocardium. Hypokinesis of the apical myocardium.  - Mitral valve: There was mild regurgitation.  - Left atrium: The atrium was mildly dilated.  - Right ventricle: Systolic function was normal.  - Pulmonary arteries: Systolic pressure was mild to moderately    elevated PA peak pressure: 44 mm Hg (S).   Impressions:   - Unable to exclude apical thrombus. Severe apical hypokinesis, if    not akinesis. Consider imaging of apical region with definity if    clinically indicated.  __________  [facility] 06/2017: Prox Cx lesion, 30 %stenosed. Prox RCA lesion, 30 %stenosed. Mid RCA to Dist RCA lesion, 30 %stenosed. There is moderate to severe left ventricular systolic dysfunction. LV end diastolic pressure is moderately elevated. There is trivial (1+) mitral regurgitation.   1. Mild nonobstructive coronary artery disease. 2. Moderately to severely reduced LV systolic function with an EF of 30-35% likely due to hypertensive heart disease. 3. Moderately elevated left ventricular end-diastolic pressure. 4. Aortic root angiography showed no evidence of ascending aortic aneurysm or dissection.   Recommendations: I suspect that the patient's symptoms are likely due to acute systolic heart failure due to hypertensive heart disease with new left bundle branch block. There is no culprit for unstable angina. We need to start him on heart failure medications. I initiated treatment with carvedilol and Entresto (the patient tolerated losartan in the  past). Gentle diuresis with oral furosemide. Smoking cessation is advised.  __________   2D echo 06/2017: - Procedure narrative: Transthoracic echocardiography. Image   quality was poor. The study was technically difficult, as a   result of poor acoustic windows and poor sound wave transmission.   Intravenous contrast (Definity) was administered. - Left ventricle: The cavity size was mildly dilated. There was   mild concentric hypertrophy. Systolic function was moderately   reduced. The estimated ejection fraction was in the range of 35%   to 40%. Diffuse hypokinesis. Features are consistent with a   pseudonormal left ventricular filling pattern, with concomitant   abnormal relaxation and increased filling  pressure (grade 2   diastolic dysfunction). - Left atrium: The atrium was mildly dilated. __________   2D echo 02/2017: - Left ventricle: The cavity size was mildly dilated. Systolic   function was moderately to severely reduced. The estimated   ejection fraction was in the range of 30% to 35%. Hypokinesis of   the anterior myocardium. Hypokinesis of the anteroseptal   myocardium. Hypokinesis of the apical myocardium. - Mitral valve: There was mild regurgitation. - Left atrium: The atrium was mildly dilated. - Right ventricle: Systolic function was normal. - Pulmonary arteries: Systolic pressure was mild to moderately   elevated PA peak pressure: 44 mm Hg (S).   Impressions:   - Unable to exclude apical thrombus. Severe apical hypokinesis, if   not akinesis. Consider imaging of apical region with definity if   clinically indicated. __________   Limited 2D echo 03/2018: - Procedure narrative: LIMITED TTE to assess for LV apical thrombus   and LVF. - Left ventricle: The cavity size was normal. Systolic function was   moderately reduced. The estimated ejection fraction was in the   range of 35% to 40%. Hypokinesis of the mid-apicalanteroseptal,   anterior, and apical  myocardium. - Left atrium: The atrium was mildly dilated.   Impressions:   - No LV thrombus.    EKG:  EKG is ordered today.  The EKG ordered today demonstrates ***  Recent Labs: 03/24/2021: ALT 20; BUN 14; Creatinine, Ser 0.84; Hemoglobin 16.2; Platelets 212; Potassium 4.4; Sodium 139; TSH 2.960  Recent Lipid Panel    Component Value Date/Time   CHOL 171 08/09/2019 0933   TRIG 255 (H) 08/09/2019 0933   HDL 32 (L) 08/09/2019 0933   CHOLHDL 5.3 (H) 08/09/2019 0933   CHOLHDL 4.3 03/03/2018 0610   VLDL 33 03/03/2018 0610   LDLCALC 96 08/09/2019 0933    PHYSICAL EXAM:    VS:  There were no vitals taken for this visit.  BMI: There is no height or weight on file to calculate BMI.  Physical Exam  Wt Readings from Last 3 Encounters:  07/07/21 209 lb (94.8 kg)  03/28/21 198 lb (89.8 kg)  03/24/21 205 lb (93 kg)     ASSESSMENT & PLAN:   Chronic combined systolic and diastolic CHF secondary to NICM:  Paroxysmal SVT:  HTN: Blood pressure ***  HLD: LDL 96.  ED: Not currently on nitrates.  Defer any refills of sildenafil to prescribing provider.   {Are you ordering a CV Procedure (e.g. stress test, cath, DCCV, TEE, etc)?   Press F2        :[id number]     Disposition: F/u with Dr. Rockey Situ or an APP in ***.   Medication Adjustments/Labs and Tests Ordered: Current medicines are reviewed at length with the patient today.  Concerns regarding medicines are outlined above. Medication changes, Labs and Tests ordered today are summarized above and listed in the Patient Instructions accessible in Encounters.   Signed, Christell Faith, PA-C 10/22/2021 11:33 AM     [place] [address] [place] [address] [place], [place] [address] [phone]

## 2021-10-27 ENCOUNTER — Ambulatory Visit: Payer: Managed Care, Other (non HMO) | Admitting: Physician Assistant

## 2021-10-28 NOTE — Telephone Encounter (Signed)
LVM to schedule appt

## 2021-11-03 ENCOUNTER — Ambulatory Visit: Payer: Managed Care, Other (non HMO) | Admitting: Physician Assistant

## 2021-11-11 NOTE — Telephone Encounter (Signed)
LMOV to reschedule  

## 2021-12-10 ENCOUNTER — Other Ambulatory Visit: Payer: Self-pay | Admitting: Cardiovascular Disease

## 2021-12-10 ENCOUNTER — Other Ambulatory Visit: Payer: Self-pay | Admitting: Family Medicine

## 2021-12-10 DIAGNOSIS — I5020 Unspecified systolic (congestive) heart failure: Secondary | ICD-10-CM

## 2021-12-11 MED ORDER — FUROSEMIDE 40 MG PO TABS: 40.0000 mg | ORAL_TABLET | Freq: Every day | ORAL | 0 refills | Status: DC

## 2021-12-11 NOTE — Telephone Encounter (Signed)
Needs appt for refills. Overdue 6-8 week F/U-last seen 5/22. Looks like he may have moved? If so he would need to get refilled by new cardiologist. Thank you!

## 2021-12-11 NOTE — Telephone Encounter (Signed)
No VM  

## 2021-12-15 NOTE — Telephone Encounter (Signed)
Attempted to schedule no ans no vm  

## 2021-12-17 MED ORDER — ENTRESTO 97-103 MG PO TABS: 1.0000 | ORAL_TABLET | Freq: Two times a day (BID) | ORAL | 0 refills | Status: DC

## 2021-12-23 ENCOUNTER — Other Ambulatory Visit: Payer: Self-pay | Admitting: Cardiovascular Disease

## 2021-12-24 ENCOUNTER — Other Ambulatory Visit: Payer: Self-pay | Admitting: Physician Assistant

## 2021-12-24 DIAGNOSIS — E118 Type 2 diabetes mellitus with unspecified complications: Secondary | ICD-10-CM

## 2021-12-24 MED ORDER — METFORMIN HCL 500 MG PO TABS: 1000.0000 mg | ORAL_TABLET | Freq: Two times a day (BID) | ORAL | 0 refills | Status: DC

## 2021-12-24 NOTE — Telephone Encounter (Signed)
Medication Refill - Medication: metFORMIN (GLUCOPHAGE) 500 MG tablet  Has the patient contacted their pharmacy? No. (Pt used to see Maurine Minister.  Was unable to request refill  Preferred Pharmacy (with phone number or street name): [facility] [address] [place], [place] - [address]  Has the patient been seen for an appointment in the last year OR does the patient have an upcoming appointment? Yes.    Agent: Please be advised that RX refills may take up to 3 business days. We ask that you follow-up with your pharmacy.

## 2021-12-24 NOTE — Telephone Encounter (Signed)
Requested medication (s) are due for refill today:   Yes  Requested medication (s) are on the active medication list:   Yes  Future visit scheduled:   Yes 12/29/2021 with Ria Comment   Last ordered: 05/29/2021 #360, 1 refill  Returned because no B12 value within 720 days per protocol.      Requested Prescriptions  Pending Prescriptions Disp Refills   metFORMIN (GLUCOPHAGE) 500 MG tablet 360 tablet 1    Sig: Take 2 tablets (1,000 mg total) by mouth 2 (two) times daily.     Endocrinology:  Diabetes - Biguanides Failed - 12/24/2021  9:31 AM      Failed - B12 Level in normal range and within 720 days    No results found for: VITAMINB12        Passed - Cr in normal range and within 360 days    Creat  Date Value Ref Range Status  12/14/2016 0.72 0.60 - 1.35 mg/dL Final   Creatinine, Ser  Date Value Ref Range Status  03/24/2021 0.84 0.76 - 1.27 mg/dL Final          Passed - HBA1C is between 0 and 7.9 and within 180 days    Hemoglobin A1C  Date Value Ref Range Status  07/07/2021 7.6 (A) 4.0 - 5.6 % Final   Hgb A1c MFr Bld  Date Value Ref Range Status  03/24/2021 7.3 (H) 4.8 - 5.6 % Final    Comment:             Prediabetes: 5.7 - 6.4          Diabetes: >6.4          Glycemic control for adults with diabetes: <7.0           Passed - eGFR in normal range and within 360 days    GFR, Est African American  Date Value Ref Range Status  12/14/2016 >89 >=60 mL/min Final   GFR calc Af Amer  Date Value Ref Range Status  10/25/2020 93 >59 mL/min/1.73 Final    Comment:    **In accordance with recommendations from the NKF-ASN Task force,**   [facility] is in the process of updating its eGFR calculation to the   2021 CKD-EPI creatinine equation that estimates kidney function   without a race variable.    GFR, Est Non African American  Date Value Ref Range Status  12/14/2016 >89 >=60 mL/min Final   GFR, Estimated  Date Value Ref Range Status  03/18/2021 >60 >60 mL/min Final     Comment:    (NOTE) Calculated using the CKD-EPI Creatinine Equation (2021)    eGFR  Date Value Ref Range Status  03/24/2021 106 >59 mL/min/1.73 Final          Passed - Valid encounter within last 6 months    Recent Outpatient Visits           5 months ago Hypertension associated with diabetes [facility])   [place], PA-C   9 months ago Acute foot pain, right   [place], PA-C   11 months ago BMI 29.0-29.9,adult   [place], [place]   1 year ago Acute systolic heart failure [facility])   [place], [place]   1 year ago Acute right ankle pain   [facility] [place], [place], [place]       Future Appointments  In 5 days Drubel, Ria Comment, PA-C [facility]            Passed - CBC within normal limits and completed in the last 12 months    WBC  Date Value Ref Range Status  03/24/2021 6.2 3.4 - 10.8 x10E3/uL Final  03/18/2021 8.8 4.0 - 10.5 K/uL Final   RBC  Date Value Ref Range Status  03/24/2021 5.59 4.14 - 5.80 x10E6/uL Final  03/18/2021 5.25 4.22 - 5.81 MIL/uL Final   Hemoglobin  Date Value Ref Range Status  03/24/2021 16.2 13.0 - 17.7 g/dL Final   Hematocrit  Date Value Ref Range Status  03/24/2021 47.3 37.5 - 51.0 % Final   MCHC  Date Value Ref Range Status  03/24/2021 34.2 31.5 - 35.7 g/dL Final  03/18/2021 33.9 30.0 - 36.0 g/dL Final   [facility]  Date Value Ref Range Status  03/24/2021 29.0 26.6 - 33.0 pg Final  03/18/2021 28.8 26.0 - 34.0 pg Final   MCV  Date Value Ref Range Status  03/24/2021 85 79 - 97 fL Final   No results found for: PLTCOUNTKUC, LABPLAT, POCPLA RDW  Date Value Ref Range Status  03/24/2021 13.1 11.6 - 15.4 % Final

## 2021-12-29 ENCOUNTER — Other Ambulatory Visit: Payer: Self-pay

## 2021-12-29 ENCOUNTER — Encounter: Payer: Self-pay | Admitting: Physician Assistant

## 2021-12-29 ENCOUNTER — Ambulatory Visit: Payer: Managed Care, Other (non HMO) | Admitting: Physician Assistant

## 2021-12-29 VITALS — BP 166/115 | HR 88 | Temp 98.8°F | Ht 70.0 in | Wt 215.7 lb

## 2021-12-29 DIAGNOSIS — E1140 Type 2 diabetes mellitus with diabetic neuropathy, unspecified: Secondary | ICD-10-CM

## 2021-12-29 DIAGNOSIS — Z1159 Encounter for screening for other viral diseases: Secondary | ICD-10-CM

## 2021-12-29 DIAGNOSIS — M1A3710 Chronic gout due to renal impairment, right ankle and foot, without tophus (tophi): Secondary | ICD-10-CM

## 2021-12-29 DIAGNOSIS — I152 Hypertension secondary to endocrine disorders: Secondary | ICD-10-CM

## 2021-12-29 DIAGNOSIS — E1159 Type 2 diabetes mellitus with other circulatory complications: Secondary | ICD-10-CM

## 2021-12-29 DIAGNOSIS — I5022 Chronic systolic (congestive) heart failure: Secondary | ICD-10-CM | POA: Diagnosis not present

## 2021-12-29 DIAGNOSIS — E1169 Type 2 diabetes mellitus with other specified complication: Secondary | ICD-10-CM

## 2021-12-29 DIAGNOSIS — E785 Hyperlipidemia, unspecified: Secondary | ICD-10-CM

## 2021-12-29 MED ORDER — ATORVASTATIN CALCIUM 40 MG PO TABS: ORAL_TABLET | ORAL | 2 refills | Status: DC

## 2021-12-29 MED ORDER — SPIRONOLACTONE 25 MG PO TABS: 25.0000 mg | ORAL_TABLET | Freq: Every day | ORAL | 0 refills | Status: DC

## 2021-12-29 MED ORDER — CARVEDILOL 12.5 MG PO TABS: 12.5000 mg | ORAL_TABLET | Freq: Two times a day (BID) | ORAL | 0 refills | Status: DC

## 2021-12-29 MED ORDER — ENTRESTO 97-103 MG PO TABS: 1.0000 | ORAL_TABLET | Freq: Two times a day (BID) | ORAL | 1 refills | Status: DC

## 2021-12-29 MED ORDER — ALLOPURINOL 100 MG PO TABS: 100.0000 mg | ORAL_TABLET | Freq: Every day | ORAL | 3 refills | Status: DC

## 2021-12-29 MED ORDER — COLCHICINE 0.6 MG PO TABS: ORAL_TABLET | ORAL | 3 refills | Status: DC

## 2021-12-29 NOTE — Assessment & Plan Note (Signed)
Denies gout flares. Continue allopurinol daily.

## 2021-12-29 NOTE — Assessment & Plan Note (Signed)
Very minimal edema Advised to elevate legs when at home, wear compression stockings

## 2021-12-29 NOTE — Assessment & Plan Note (Addendum)
Elevated today secondary to being out of medications. Refilled carvedilol and entresto. Pt states he is still taking spirololactone and lasix daily Encouraged pt to make f/u appt at cardiologist's office, especially in light of increasing palpitation issues

## 2021-12-29 NOTE — Patient Instructions (Signed)
Recombinant Zoster (Shingles) Vaccine: What You Need to Know °1. Why get vaccinated? °Recombinant zoster (shingles) vaccine can prevent shingles. °Shingles (also called herpes zoster, or just zoster) is a painful skin rash, usually with blisters. In addition to the rash, shingles can cause fever, headache, chills, or upset stomach. Rarely, shingles can lead to complications such as pneumonia, hearing problems, blindness, brain inflammation (encephalitis), or death. °The risk of shingles increases with age. The most common complication of shingles is long-term nerve pain called postherpetic neuralgia (PHN). PHN occurs in the areas where the shingles rash was and can last for months or years after the rash goes away. The pain from PHN can be severe and debilitating. °The risk of PHN increases with age. An older adult with shingles is more likely to develop PHN and have longer lasting and more severe pain than a younger person. °People with weakened immune systems also have a higher risk of getting shingles and complications from the disease. °Shingles is caused by varicella-zoster virus, the same virus that causes chickenpox. After you have chickenpox, the virus stays in your body and can cause shingles later in life. Shingles cannot be passed from one person to another, but the virus that causes shingles can spread and cause chickenpox in someone who has never had chickenpox or has never received chickenpox vaccine. °2. Recombinant shingles vaccine °Recombinant shingles vaccine provides strong protection against shingles. By preventing shingles, recombinant shingles vaccine also protects against PHN and other complications. °Recombinant shingles vaccine is recommended for: °Adults 50 years and older °Adults 19 years and older who have a weakened immune system because of disease or treatments °Shingles vaccine is given as a two-dose series. For most people, the second dose should be given 2 to 6 months after the first  dose. Some people who have or will have a weakened immune system can get the second dose 1 to 2 months after the first dose. Ask your health care provider for guidance. °People who have had shingles in the past and people who have received varicella (chickenpox) vaccine are recommended to get recombinant shingles vaccine. The vaccine is also recommended for people who have already gotten another type of shingles vaccine, the live shingles vaccine. There is no live virus in recombinant shingles vaccine. °Shingles vaccine may be given at the same time as other vaccines. °3. Talk with your health care provider °Tell your vaccination provider if the person getting the vaccine: °Has had an allergic reaction after a previous dose of recombinant shingles vaccine, or has any severe, life-threatening allergies °Is currently experiencing an episode of shingles °Is pregnant °In some cases, your health care provider may decide to postpone shingles vaccination until a future visit. °People with minor illnesses, such as a cold, may be vaccinated. People who are moderately or severely ill should usually wait until they recover before getting recombinant shingles vaccine. °Your health care provider can give you more information. °4. Risks of a vaccine reaction °A sore arm with mild or moderate pain is very common after recombinant shingles vaccine. Redness and swelling can also happen at the site of the injection. °Tiredness, muscle pain, headache, shivering, fever, stomach pain, and nausea are common after recombinant shingles vaccine. °These side effects may temporarily prevent a vaccinated person from doing regular activities. Symptoms usually go away on their own in 2 to 3 days. You should still get the second dose of recombinant shingles vaccine even if you had one of these reactions after the first dose. °Guillain-Barré   syndrome (GBS), a serious nervous system disorder, has been reported very rarely after recombinant zoster  vaccine. °People sometimes faint after medical procedures, including vaccination. Tell your provider if you feel dizzy or have vision changes or ringing in the ears. °As with any medicine, there is a very remote chance of a vaccine causing a severe allergic reaction, other serious injury, or death. °5. What if there is a serious problem? °An allergic reaction could occur after the vaccinated person leaves the clinic. If you see signs of a severe allergic reaction (hives, swelling of the face and throat, difficulty breathing, a fast heartbeat, dizziness, or weakness), call 9-1-1 and get the person to the nearest hospital. °For other signs that concern you, call your health care provider. °Adverse reactions should be reported to the Vaccine Adverse Event Reporting System (VAERS). Your health care provider will usually file this report, or you can do it yourself. Visit the VAERS website at www.vaers.hhs.gov or call 1-800-822-7967. VAERS is only for reporting reactions, and VAERS staff members do not give medical advice. °6. How can I learn more? °Ask your health care provider. °Call your local or state health department. °Visit the website of the Food and Drug Administration (FDA) for vaccine package inserts and additional information at www.fda.gov/vaccinesblood-biologics/vaccines. °Contact the Centers for Disease Control and Prevention (CDC): °Call 1-800-232-4636 (1-800-CDC-INFO) or °Visit CDC's website at www.cdc.gov/vaccines. °Vaccine Information Statement Recombinant Zoster Vaccine (12/20/2020) °This information is not intended to replace advice given to you by your health care provider. Make sure you discuss any questions you have with your health care provider. °Document Revised: 07/18/2021 Document Reviewed: 01/03/2021 °Elsevier Patient Education © 2022 Elsevier Inc. ° °

## 2021-12-29 NOTE — Assessment & Plan Note (Addendum)
Will check A1c, if lower than previous, consider changing meds as suspicion for hypoglycemic episodes. Advised pt to check BS during these episodes if possible Increasing peripheral neuropathy, pt currently takes gabapentin prn, advised for him to take nightly

## 2021-12-29 NOTE — Progress Notes (Signed)
I,Keith [name],acting as a Education administrator for [employer], PA-C.,have documented all relevant documentation on the behalf of Keith Kirschner, PA-C,as directed by  Keith Kirschner, PA-C while in the presence of Keith Kirschner, PA-C.  Established patient visit   Patient: Keith [name]   DOB: [birth year]   51 y.o. Male  MRN: [id number] Visit Date: 12/29/2021  Today's healthcare provider: Mikey Kirschner, PA-C   Cc. 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 He still smokes 1-1 1/2 packs daily.  He has been without BP medication for over a week.  He reports a few episodes, used to be sporadic but over the past month has happened three times, where he feels his heart flutter and beat fast in his chest. Describes one episodes where this occurred, he started to get tunnel vision, and felt syncopal. Reports these episodes lasting 5-20 minutes. Reports mentioning to cardiologist previously with no etiology discovered.   He also reports episodes of dizziness ,fatigue, sweating that seem to improve after food consumption, especially sweet food. He has never checked his blood pressure or sugar during this time.   He reports his feet feel full and swollen when they hang down, and his burning/neuropathy in his feet has worsened, he feels this almost on a nightly basis. Reports using cold packs and ice water to help relieve his symptoms. During the day he has altered sensation and describes some socks as 'feeling like sandpaper'.   Denies chest pain, shortness of breath, increasing or different cough.   Diabetes Mellitus Type II, Follow-up  Lab Results  Component Value Date   HGBA1C 7.6 (A) 07/07/2021   HGBA1C 7.3 (H) 03/24/2021   HGBA1C 11.3 (H) 10/25/2020   Wt Readings from Last 3 Encounters:  12/29/21 215 lb 11.2  oz (97.8 kg)  07/07/21 209 lb (94.8 kg)  03/28/21 198 lb (89.8 kg)   Last seen for diabetes 5 months ago. (07/07/21) Management since then includes continue Metformin, Trulicity, Farxiga/ He reports fair compliance with treatment. He is not having side effects.  Symptoms: No fatigue No foot ulcerations  No appetite changes No nausea  No paresthesia of the feet  No polydipsia  No polyuria Yes visual disturbances   No vomiting     Home blood sugar records:  not being checked  Episodes of hypoglycemia? Yes weakness, sweating, dizziness, but unsure if related to low sugar   Current insulin regiment:  Most Recent Eye Exam: Needs to update Current exercise: none Current diet habits: in general, an "unhealthy" diet (barely eats)  Pertinent Labs: Lab Results  Component Value Date   CHOL 171 08/09/2019   HDL 32 (L) 08/09/2019   LDLCALC 96 08/09/2019   TRIG 255 (H) 08/09/2019   CHOLHDL 5.3 (H) 08/09/2019   Lab Results  Component Value Date   NA 139 03/24/2021   K 4.4 03/24/2021   CREATININE 0.84 03/24/2021   EGFR 106 03/24/2021     ---------------------------------------------------------------------------------------------------   Medications: Outpatient Medications Prior to Visit  Medication Sig   aspirin 81 MG chewable tablet Chew 1 tablet (81 mg total) by mouth daily.   Blood Glucose Monitoring Suppl (ONETOUCH VERIO) w/Device KIT Use daily to check blood glucose   FARXIGA 10 MG TABS tablet TAKE 1 TABLET BY MOUTH ONCE DAILY BEFORE BREAKFAST  furosemide (LASIX) 40 MG tablet Take 1 tablet (40 mg total) by mouth daily.   gabapentin (NEURONTIN) 300 MG capsule Take 1 capsule (300 mg total) by mouth at bedtime.   glucose blood (ONETOUCH VERIO) test strip Use daily to check blood glucose   Lancets (ONETOUCH ULTRASOFT) lancets Use daily to check blood glucose   metFORMIN (GLUCOPHAGE) 500 MG tablet Take 2 tablets (1,000 mg total) by mouth 2 (two) times daily.   sildenafil  (VIAGRA) 50 MG tablet Take 1 tablet (50 mg total) by mouth as needed for erectile dysfunction. May take 50-100 mg as needed   TRULICITY 7.03 JK/0.9FG SOPN INJECT 1 SYRINGE (0.75MG) SUBCUTANEOUSLY ONCE A WEEK   [DISCONTINUED] allopurinol (ZYLOPRIM) 100 MG tablet Take 1 tablet (100 mg total) by mouth daily.   [DISCONTINUED] atorvastatin (LIPITOR) 40 MG tablet TAKE 1 TABLET BY MOUTH ONCE DAILY AT  6PM Please schedule office visit before any future refills.   [DISCONTINUED] colchicine 0.6 MG tablet Take 1 tablet (0.6 mg total) by mouth daily prn gout attack.   [DISCONTINUED] sacubitril-valsartan (ENTRESTO) 97-103 MG Take 1 tablet by mouth 2 (two) times daily. Pt needs to make appt with provider for further refills - 2nd attempt   [DISCONTINUED] spironolactone (ALDACTONE) 25 MG tablet Take 1 tablet by mouth once daily   [DISCONTINUED] carvedilol (COREG) 12.5 MG tablet Take 1 tablet (12.5 mg total) by mouth 2 (two) times daily.   No facility-administered medications prior to visit.    Review of Systems  Constitutional:  Positive for fatigue. Negative for fever.  Respiratory:  Positive for cough. Negative for shortness of breath.   Cardiovascular:  Positive for palpitations and leg swelling. Negative for chest pain.  Neurological:  Positive for light-headedness. Negative for dizziness and headaches.      Objective    Blood pressure (!) 166/115, pulse 88, temperature 98.8 F (37.1 C), temperature source Oral, height 5' 10"  (1.778 m), weight 215 lb 11.2 oz (97.8 kg), SpO2 98 %.   Physical Exam Constitutional:      General: He is awake.     Appearance: He is well-developed.  HENT:     Head: Normocephalic.  Eyes:     Conjunctiva/sclera: Conjunctivae normal.  Cardiovascular:     Rate and Rhythm: Normal rate and regular rhythm.     Pulses:          Dorsalis pedis pulses are 3+ on the right side and 3+ on the left side.     Heart sounds: Normal heart sounds.  Pulmonary:     Effort: Pulmonary  effort is normal.     Breath sounds: Normal breath sounds.  Musculoskeletal:     Right lower leg: 1+ Edema present.     Left lower leg: 1+ Edema present.  Feet:     Right foot:     Skin integrity: Skin integrity normal.     Toenail Condition: Right toenails are normal.     Left foot:     Skin integrity: Skin integrity normal.     Toenail Condition: Left toenails are normal.  Skin:    General: Skin is warm.  Neurological:     Mental Status: He is alert and oriented to person, place, and time.  Psychiatric:        Attention and Perception: Attention normal.        Mood and Affect: Mood normal.        Speech: Speech normal.        Behavior: Behavior is cooperative.  No results found for any visits on 12/29/21.  Assessment & Plan     Problem List Items Addressed This Visit       Cardiovascular and Mediastinum   Hypertension associated with diabetes ([place])    Elevated today secondary to being out of medications. Refilled carvedilol and entresto. Pt states he is still taking spirololactone and lasix daily Encouraged pt to make f/u appt at cardiologist's office      Relevant Medications   sacubitril-valsartan (ENTRESTO) 97-103 MG   atorvastatin (LIPITOR) 40 MG tablet   spironolactone (ALDACTONE) 25 MG tablet   carvedilol (COREG) 12.5 MG tablet   Other Relevant Orders   Comprehensive Metabolic Panel (CMET)     Endocrine   Type 2 diabetes mellitus with diabetic neuropathy, without long-term current use of insulin (HCC) - Primary    Will check A1c, if lower than previous, consider changing meds as suspicion for hypoglycemic episodes. Advised pt to check BS during these episodes if possible Increasing peripheral neuropathy, pt currently takes gabapentin prn, advised for him to take nightly      Relevant Medications   atorvastatin (LIPITOR) 40 MG tablet   Other Relevant Orders   HgB A1c   Hyperlipidemia associated with type 2 diabetes mellitus (HCC)    Taking  atorvastatin 40, will recheck lipids      Relevant Medications   sacubitril-valsartan (ENTRESTO) 97-103 MG   atorvastatin (LIPITOR) 40 MG tablet   spironolactone (ALDACTONE) 25 MG tablet   carvedilol (COREG) 12.5 MG tablet   Other Relevant Orders   Lipid Profile     Musculoskeletal and Integument   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 Continue allopurinol daily.      Relevant Medications   colchicine 0.6 MG tablet   allopurinol (ZYLOPRIM) 100 MG tablet   Other Visit Diagnoses     HFrEF (heart failure with reduced ejection fraction) (HCC)       Relevant Medications   sacubitril-valsartan (ENTRESTO) 97-103 MG   atorvastatin (LIPITOR) 40 MG tablet   spironolactone (ALDACTONE) 25 MG tablet   carvedilol (COREG) 12.5 MG tablet   Encounter for hepatitis C screening test for low risk patient       Relevant Orders   Hepatitis C antibody       Encouraged he schedule a follow up eye exam, explained purpose of exam to monitor for DM and HTN disease.  Return in about 3 months (around 03/28/2022) for DMII, hypertension.      I, Keith Kirschner, PA-C have reviewed all documentation for this visit. The documentation on  12/29/2021  for the exam, diagnosis, procedures, and orders are all accurate and complete.    Keith Kirschner, PA-C  [facility] [phone] (phone) [phone] (fax)  [place]

## 2021-12-29 NOTE — Assessment & Plan Note (Signed)
Taking atorvastatin 40, will recheck lipids

## 2021-12-30 ENCOUNTER — Encounter: Payer: Self-pay | Admitting: Physician Assistant

## 2021-12-30 ENCOUNTER — Telehealth: Payer: Self-pay | Admitting: *Deleted

## 2021-12-30 ENCOUNTER — Other Ambulatory Visit: Payer: Self-pay | Admitting: Physician Assistant

## 2021-12-30 DIAGNOSIS — E1142 Type 2 diabetes mellitus with diabetic polyneuropathy: Secondary | ICD-10-CM

## 2021-12-30 DIAGNOSIS — I152 Hypertension secondary to endocrine disorders: Secondary | ICD-10-CM

## 2021-12-30 DIAGNOSIS — I5022 Chronic systolic (congestive) heart failure: Secondary | ICD-10-CM

## 2021-12-30 DIAGNOSIS — E1169 Type 2 diabetes mellitus with other specified complication: Secondary | ICD-10-CM

## 2021-12-30 LAB — LIPID PANEL
Chol/HDL Ratio: 4.8 ratio (ref 0.0–5.0)
Cholesterol, Total: 155 mg/dL (ref 100–199)
HDL: 32 mg/dL — ABNORMAL LOW (ref >39)
LDL Chol Calc (NIH): 80 mg/dL (ref 0–99)
Triglycerides: 263 mg/dL — ABNORMAL HIGH (ref 0–149)
VLDL Cholesterol Cal: 43 mg/dL — ABNORMAL HIGH (ref 5–40)

## 2021-12-30 LAB — COMPREHENSIVE METABOLIC PANEL
ALT: 12 IU/L (ref 0–44)
AST: 12 IU/L (ref 0–40)
Albumin/Globulin Ratio: 1.4 (ref 1.2–2.2)
Albumin: 4.3 g/dL (ref 3.8–4.9)
Alkaline Phosphatase: 93 IU/L (ref 44–121)
BUN/Creatinine Ratio: 14 (ref 9–20)
BUN: 17 mg/dL (ref 6–24)
Bilirubin Total: 0.7 mg/dL (ref 0.0–1.2)
CO2: 24 mmol/L (ref 20–29)
Calcium: 9.2 mg/dL (ref 8.7–10.2)
Chloride: 102 mmol/L (ref 96–106)
Creatinine, Ser: 1.18 mg/dL (ref 0.76–1.27)
Globulin, Total: 3 g/dL (ref 1.5–4.5)
Glucose: 224 mg/dL — ABNORMAL HIGH (ref 70–99)
Potassium: 4.5 mmol/L (ref 3.5–5.2)
Sodium: 141 mmol/L (ref 134–144)
Total Protein: 7.3 g/dL (ref 6.0–8.5)
eGFR: 75 mL/min/{1.73_m2} (ref >59)

## 2021-12-30 LAB — HEMOGLOBIN A1C
Est. average glucose Bld gHb Est-mCnc: 197 mg/dL
Hgb A1c MFr Bld: 8.5 % — ABNORMAL HIGH (ref 4.8–5.6)

## 2021-12-30 LAB — HEPATITIS C ANTIBODY: Hep C Virus Ab: NONREACTIVE

## 2021-12-30 MED ORDER — DAPAGLIFLOZIN PROPANEDIOL 10 MG PO TABS: 10.0000 mg | ORAL_TABLET | Freq: Every day | ORAL | 3 refills | Status: DC

## 2021-12-30 NOTE — Chronic Care Management (AMB) (Signed)
°  Care Management   Outreach Note  12/30/2021 Name: Keith [name] MRN: [id number] DOB: Apr 28, 1971  Referred by: Alfredia Ferguson, PA-C Reason for referral : Care Coordination (Initial outreach to schedule referral with Pharm D)   An unsuccessful telephone outreach was attempted today. The patient was referred to the case management team for assistance with care management and care coordination.   Follow Up Plan:  If patient returns call to provider office, please advise to call Embedded Care Management Care Guide Bellamie Turney at [phone]  Burman Nieves, CCMA Care Guide, Embedded Care Coordination [facility]   Care Management  Direct Dial: [phone]

## 2021-12-30 NOTE — Chronic Care Management (AMB) (Signed)
°  Care Management   Note  12/30/2021 Name: Keith [name] MRN: [id number] DOB: [birth year]  Keith [name] is a 51 y.o. year old male who is a primary care patient of Keith [name], [place]. I reached out to Keith [name] by phone today in response to a referral sent by Keith [name]'s primary care provider.   Keith [name] was given information about care management services today including:  Care management services include personalized support from designated clinical staff supervised by his physician, including individualized plan of care and coordination with other care providers 24/7 contact phone numbers for assistance for urgent and routine care needs. The patient may stop care management services at any time by phone call to the office staff.  Patient did not agree to enrollment in care management services and does not wish to consider at this time.  Follow up plan: Patient declines further follow up and engagement by the care management team. Appropriate care team members and provider have been notified via electronic communication.  Pt appreciative but says he spoke with pharmacy who reached out to insurance company, determined that insurance covering cost of [place] and medications  Keith [name], [place]: [phone]

## 2022-01-22 ENCOUNTER — Other Ambulatory Visit: Payer: Self-pay | Admitting: Physician Assistant

## 2022-01-22 DIAGNOSIS — E118 Type 2 diabetes mellitus with unspecified complications: Secondary | ICD-10-CM

## 2022-01-23 MED ORDER — TRULICITY 0.75 MG/0.5ML ~~LOC~~ SOAJ: 0.7500 mg | SUBCUTANEOUS | 0 refills | Status: DC

## 2022-03-11 NOTE — Telephone Encounter (Signed)
No ans no vm   3 attempts to schedule fu appt from recall list.   Deleting recall.   

## 2022-03-13 ENCOUNTER — Other Ambulatory Visit: Payer: Self-pay | Admitting: Physician Assistant

## 2022-03-13 DIAGNOSIS — I5020 Unspecified systolic (congestive) heart failure: Secondary | ICD-10-CM

## 2022-03-16 ENCOUNTER — Encounter: Payer: Self-pay | Admitting: Physician Assistant

## 2022-03-17 MED ORDER — FUROSEMIDE 40 MG PO TABS: 40.0000 mg | ORAL_TABLET | Freq: Every day | ORAL | 0 refills | Status: DC

## 2022-03-18 ENCOUNTER — Other Ambulatory Visit: Payer: Self-pay

## 2022-03-18 DIAGNOSIS — M1A3710 Chronic gout due to renal impairment, right ankle and foot, without tophus (tophi): Secondary | ICD-10-CM

## 2022-03-18 MED ORDER — COLCHICINE 0.6 MG PO TABS: ORAL_TABLET | ORAL | 3 refills | Status: DC

## 2022-04-06 ENCOUNTER — Encounter: Payer: Managed Care, Other (non HMO) | Admitting: Physician Assistant

## 2022-04-06 NOTE — Progress Notes (Deleted)
      Established patient visit   Patient: Keith [name]   DOB: 05-18-1971   51 y.o. Male  MRN: [id number] Visit Date: 04/06/2022  Today's healthcare provider: Mikey Kirschner, PA-C   No chief complaint on file.  Subjective    HPI    Medications: Outpatient Medications Prior to Visit  Medication Sig   allopurinol (ZYLOPRIM) 100 MG tablet Take 1 tablet (100 mg total) by mouth daily.   aspirin 81 MG chewable tablet Chew 1 tablet (81 mg total) by mouth daily.   atorvastatin (LIPITOR) 40 MG tablet TAKE 1 TABLET BY MOUTH ONCE DAILY AT  6PM   Blood Glucose Monitoring Suppl (ONETOUCH VERIO) w/Device KIT Use daily to check blood glucose   carvedilol (COREG) 12.5 MG tablet Take 1 tablet (12.5 mg total) by mouth 2 (two) times daily.   colchicine 0.6 MG tablet Take 1 tablet (0.6 mg total) by mouth daily prn gout attack.   dapagliflozin propanediol (FARXIGA) 10 MG TABS tablet Take 1 tablet (10 mg total) by mouth daily before breakfast.   Dulaglutide (TRULICITY) 4.01 UU/7.2ZD SOPN Inject 0.75 mg into the skin once a week.   furosemide (LASIX) 40 MG tablet Take 1 tablet (40 mg total) by mouth daily.   gabapentin (NEURONTIN) 300 MG capsule Take 1 capsule (300 mg total) by mouth at bedtime.   glucose blood (ONETOUCH VERIO) test strip Use daily to check blood glucose   Lancets (ONETOUCH ULTRASOFT) lancets Use daily to check blood glucose   metFORMIN (GLUCOPHAGE) 500 MG tablet Take 2 tablets (1,000 mg total) by mouth 2 (two) times daily.   sacubitril-valsartan (ENTRESTO) 97-103 MG Take 1 tablet by mouth 2 (two) times daily. Pt needs to make appt with provider for further refills - 2nd attempt   sildenafil (VIAGRA) 50 MG tablet Take 1 tablet (50 mg total) by mouth as needed for erectile dysfunction. May take 50-100 mg as needed   spironolactone (ALDACTONE) 25 MG tablet Take 1 tablet (25 mg total) by mouth daily.   No facility-administered medications prior to visit.    Review of  Systems  {Labs  Heme  Chem  Endocrine  Serology  Results Review (optional):23779}   Objective    There were no vitals taken for this visit. {Show previous vital signs (optional):23777}  Physical Exam  ***  No results found for any visits on 04/06/22.  Assessment & Plan     ***  No follow-ups on file.      {provider attestation***:1}   Mikey Kirschner, PA-C  [facility] [phone] (phone) [phone] (fax)  [place]

## 2022-04-09 ENCOUNTER — Encounter: Payer: Self-pay | Admitting: Physician Assistant

## 2022-04-09 ENCOUNTER — Ambulatory Visit (INDEPENDENT_AMBULATORY_CARE_PROVIDER_SITE_OTHER): Payer: Managed Care, Other (non HMO) | Admitting: Physician Assistant

## 2022-04-09 VITALS — BP 160/104 | HR 86 | Ht 69.0 in | Wt 213.2 lb

## 2022-04-09 DIAGNOSIS — I152 Hypertension secondary to endocrine disorders: Secondary | ICD-10-CM

## 2022-04-09 DIAGNOSIS — M1A3710 Chronic gout due to renal impairment, right ankle and foot, without tophus (tophi): Secondary | ICD-10-CM

## 2022-04-09 DIAGNOSIS — R471 Dysarthria and anarthria: Secondary | ICD-10-CM | POA: Insufficient documentation

## 2022-04-09 DIAGNOSIS — Z0000 Encounter for general adult medical examination without abnormal findings: Secondary | ICD-10-CM | POA: Diagnosis not present

## 2022-04-09 DIAGNOSIS — E1140 Type 2 diabetes mellitus with diabetic neuropathy, unspecified: Secondary | ICD-10-CM

## 2022-04-09 DIAGNOSIS — I5022 Chronic systolic (congestive) heart failure: Secondary | ICD-10-CM | POA: Diagnosis not present

## 2022-04-09 DIAGNOSIS — E1159 Type 2 diabetes mellitus with other circulatory complications: Secondary | ICD-10-CM | POA: Diagnosis not present

## 2022-04-09 DIAGNOSIS — R002 Palpitations: Secondary | ICD-10-CM

## 2022-04-09 MED ORDER — ENTRESTO 97-103 MG PO TABS: 1.0000 | ORAL_TABLET | Freq: Two times a day (BID) | ORAL | 1 refills | Status: AC

## 2022-04-09 MED ORDER — SPIRONOLACTONE 25 MG PO TABS: 25.0000 mg | ORAL_TABLET | Freq: Every day | ORAL | 1 refills | Status: DC

## 2022-04-09 MED ORDER — METFORMIN HCL 500 MG PO TABS: 1000.0000 mg | ORAL_TABLET | Freq: Two times a day (BID) | ORAL | 1 refills | Status: DC

## 2022-04-09 MED ORDER — DAPAGLIFLOZIN PROPANEDIOL 10 MG PO TABS: 10.0000 mg | ORAL_TABLET | Freq: Every day | ORAL | 1 refills | Status: DC

## 2022-04-09 MED ORDER — CARVEDILOL 12.5 MG PO TABS: 12.5000 mg | ORAL_TABLET | Freq: Two times a day (BID) | ORAL | 1 refills | Status: DC

## 2022-04-09 MED ORDER — TRULICITY 0.75 MG/0.5ML ~~LOC~~ SOAJ: 0.7500 mg | SUBCUTANEOUS | 3 refills | Status: DC

## 2022-04-09 NOTE — Progress Notes (Signed)
g    Complete physical exam   Patient: Keith [name]   DOB: August 31, 1971   51 y.o. Male  MRN: [id number] Visit Date: 04/09/2022  Today's healthcare provider: Mikey Kirschner, PA-C  Cc. cpe  Subjective    Keith [name] is a 51 y.o. male who presents today for a complete physical exam.  He reports consuming a general diet. The patient has a physically strenuous job, but has no regular exercise apart from work.  He generally feels fairly well. He reports sleeping poorly. He does have additional problems to discuss today.  HPI   Reports one episode within the last month where he coughed suddenly very hard, and woke up on the floor hours later. Felt well after that. Denies new or chronic cough.   He reports intermittent flutters/palpitations. Denies chest pain, SOB, dizziness. Reports being checked for these before.  Reports episodes where he cannot speak correctly--unable to  form words, can't think of the word to say, lasts 20-30 minutes and then he returns to normal. Several times over the last month. Denies any associated headache, chest pain, weakness, numbness, during these times.   Reports not having his entresto for a few weeks secondary to cost. He also ran out of the [place] and has not picked up any refills.   Past Medical History:  Diagnosis Date   Chronic combined systolic (congestive) and diastolic (congestive) heart failure ([place])    a. 06/2017 Echo: EF 35-40%, Gr1 DD; b. 02/2018 Echo: Ef 30-35%; c. 03/2018 Echo: EF 35-40%, mid-apicalanteroseptal, ant, and apical HK. Mildly dil LA. No LV thrombus.   Diabetes mellitus    Erectile dysfunction    Essential hypertension    Frequent headaches    NICM (nonischemic cardiomyopathy) ([place])    a. 06/2017 Echo: EF 35-40%, Gr1 DD; b. 06/2017 Cath: nonobs dzs; c. 02/2018 Echo: Ef 30-35%; d. 03/2018 Echo: EF 35-40%, mid-apicalanteroseptal, ant, and apical HK. Mildly dil LA. No LV thrombus.   Non-obstructive Coronary Artery Disease     a. 06/2017 Cath: LM nl, LAD nl, D1/2/3 nl, LCX 30p, OM1 nl, OM2 min irregs, OM3/4 nl, RCA 30p/m, RPL2/3 nl.   TIA (transient ischemic attack)    a. 02/2018 MRA w/o acute findings; b. 02/2018 Carotis U/S: no hemodynamically significant stenoses.   Tobacco abuse    Past Surgical History:  Procedure Laterality Date   COLONOSCOPY WITH PROPOFOL N/A 02/19/2017   Procedure: COLONOSCOPY WITH PROPOFOL;  Surgeon: Jonathon Bellows, MD;  Location: [facility];  Service: Endoscopy;  Laterality: N/A;   ESOPHAGOGASTRODUODENOSCOPY (EGD) WITH PROPOFOL N/A 02/19/2017   Procedure: ESOPHAGOGASTRODUODENOSCOPY (EGD) WITH PROPOFOL;  Surgeon: Jonathon Bellows, MD;  Location: [facility] ENDOSCOPY;  Service: Endoscopy;  Laterality: N/A;   LEFT HEART CATH AND CORONARY ANGIOGRAPHY N/A 07/11/2017   Procedure: LEFT HEART CATH AND CORONARY ANGIOGRAPHY;  Surgeon: Wellington Hampshire, MD;  Location: [place] [facility];  Service: Cardiovascular;  Laterality: N/A;   Social History   Socioeconomic History   Marital status: Married    Spouse name: Not on file   Number of children: Not on file   Years of education: Not on file   Highest education level: Not on file  Occupational History   Not on file  Tobacco Use   Smoking status: Every Day    Packs/day: 1.00    Types: Cigarettes   Smokeless tobacco: Never  Vaping Use   Vaping Use: Never used  Substance and Sexual Activity   Alcohol use: Yes    Comment:  Occasionally    Drug use: No   Sexual activity: Yes  Other Topics Concern   Not on file  Social History Narrative   Not on file   Social Determinants of Health   Financial Resource Strain: Not on file  Food Insecurity: Not on file  Transportation Needs: Not on file  Physical Activity: Not on file  Stress: Not on file  Social Connections: Not on file  Intimate Partner Violence: Not on file   Family Status  Relation Name Status   Mother  Alive   Father  Deceased   Family History  Problem Relation Age of Onset    Hypertension Mother    Diabetes Mother    Hypertension Father    Diabetes Father    Allergies  Allergen Reactions   Other Other (See Comments)    Muscle Relaxers (caused psychotic break) Other reaction(s): Other (See Comments) Muscle Relaxers (caused psychotic break)    Patient Care Team: Mikey Kirschner, PA-C as PCP - General (Physician Assistant) Minna Merritts, MD as PCP - Cardiology (Cardiology) Pa, [place] (Optometry)   Medications: Outpatient Medications Prior to Visit  Medication Sig   atorvastatin (LIPITOR) 40 MG tablet TAKE 1 TABLET BY MOUTH ONCE DAILY AT  6PM   colchicine 0.6 MG tablet Take 1 tablet (0.6 mg total) by mouth daily prn gout attack.   furosemide (LASIX) 40 MG tablet Take 1 tablet (40 mg total) by mouth daily.   sildenafil (VIAGRA) 50 MG tablet Take 1 tablet (50 mg total) by mouth as needed for erectile dysfunction. May take 50-100 mg as needed   [DISCONTINUED] carvedilol (COREG) 12.5 MG tablet Take 1 tablet (12.5 mg total) by mouth 2 (two) times daily.   [DISCONTINUED] Dulaglutide (TRULICITY) 5.69 VX/4.8AX SOPN Inject 0.75 mg into the skin once a week.   [DISCONTINUED] metFORMIN (GLUCOPHAGE) 500 MG tablet Take 2 tablets (1,000 mg total) by mouth 2 (two) times daily.   [DISCONTINUED] sacubitril-valsartan (ENTRESTO) 97-103 MG Take 1 tablet by mouth 2 (two) times daily. Pt needs to make appt with provider for further refills - 2nd attempt   [DISCONTINUED] spironolactone (ALDACTONE) 25 MG tablet Take 1 tablet (25 mg total) by mouth daily.   allopurinol (ZYLOPRIM) 100 MG tablet Take 1 tablet (100 mg total) by mouth daily. (Patient not taking: Reported on 04/09/2022)   aspirin 81 MG chewable tablet Chew 1 tablet (81 mg total) by mouth daily. (Patient not taking: Reported on 04/09/2022)   Blood Glucose Monitoring Suppl (ONETOUCH VERIO) w/Device KIT Use daily to check blood glucose (Patient not taking: Reported on 04/09/2022)   glucose blood (ONETOUCH VERIO)  test strip Use daily to check blood glucose (Patient not taking: Reported on 04/09/2022)   Lancets (ONETOUCH ULTRASOFT) lancets Use daily to check blood glucose (Patient not taking: Reported on 04/09/2022)   [DISCONTINUED] dapagliflozin propanediol (FARXIGA) 10 MG TABS tablet Take 1 tablet (10 mg total) by mouth daily before breakfast. (Patient not taking: Reported on 04/09/2022)   [DISCONTINUED] gabapentin (NEURONTIN) 300 MG capsule Take 1 capsule (300 mg total) by mouth at bedtime. (Patient not taking: Reported on 04/09/2022)   No facility-administered medications prior to visit.    Review of Systems  Constitutional:  Negative for fatigue and fever.  Respiratory:  Positive for cough. Negative for shortness of breath.   Cardiovascular:  Positive for palpitations. Negative for chest pain and leg swelling.  Neurological:  Positive for syncope and speech difficulty. Negative for dizziness and headaches.   Objective  Blood pressure (!) 160/104, pulse 86, height _0  (1.753 m), weight 213 lb 3.2 oz (96.7 kg), SpO2 98 %.    Physical Exam Constitutional:      General: He is awake.     Appearance: He is well-developed.  HENT:     Head: Normocephalic.  Eyes:     Conjunctiva/sclera: Conjunctivae normal.  Cardiovascular:     Rate and Rhythm: Normal rate and regular rhythm.     Heart sounds: Normal heart sounds.  Pulmonary:     Effort: Pulmonary effort is normal.     Breath sounds: Normal breath sounds.  Musculoskeletal:     Right lower leg: No edema.     Left lower leg: No edema.  Skin:    General: Skin is warm.  Neurological:     Mental Status: He is alert and oriented to person, place, and time.  Psychiatric:        Attention and Perception: Attention normal.        Mood and Affect: Mood normal.        Speech: Speech normal.        Behavior: Behavior is cooperative.     Last depression screening scores    04/09/2022    3:44 PM 12/29/2021    1:14 PM 07/07/2021    8:25 AM  PHQ  2/9 Scores  PHQ - 2 Score 0 0 0  PHQ- 9 Score 1 0 0   Last fall risk screening    04/09/2022    3:43 PM  [place] in the past year? 0  Number falls in past yr: 0  Injury with Fall? 0  Risk for fall due to : No Fall Risks   Last Audit-C alcohol use screening    04/09/2022    3:43 PM  Alcohol Use Disorder Test (AUDIT)  1. How often do you have a drink containing alcohol? 0  2. How many drinks containing alcohol do you have on a typical day when you are drinking? 0  3. How often do you have six or more drinks on one occasion? 0  AUDIT-C Score 0   A score of 3 or more in women, and 4 or more in men indicates increased risk for alcohol abuse, EXCEPT if all of the points are from question 1   Results for orders placed or performed in visit on 04/09/22  Urine Microalbumin w/creat. ratio  Result Value Ref Range   Creatinine, Urine 94.0 Not Estab. mg/dL   Microalbumin, Urine 13.8 Not Estab. ug/mL   Microalb/Creat Ratio 15 0 - 29 mg/g creat  HgB A1c  Result Value Ref Range   Hgb A1c MFr Bld CANCELED %  Uric acid  Result Value Ref Range   Uric Acid CANCELED mg/dL    Assessment & Plan    Routine Health Maintenance and Physical Exam  Exercise Activities and Dietary recommendations --balanced diet high in fiber and protein, low in sugars, carbs, fats. --physical activity/exercise 30 minutes 3-5 times a week     Immunization History  Administered Date(s) Administered   PFIZER(Purple Top)SARS-COV-2 Vaccination 02/29/2020, 03/26/2020   Tdap 01/24/2016    Health Maintenance  Topic Date Due   OPHTHALMOLOGY EXAM  04/02/2019   FOOT EXAM  03/24/2022   COVID-19 Vaccine (3 - Booster for Pfizer series) 04/25/2022 (Originally 05/21/2020)   Zoster Vaccines- Shingrix (1 of 2) 07/10/2022 (Originally 09/27/2020)   INFLUENZA VACCINE  06/16/2022   HEMOGLOBIN A1C  10/10/2022   TETANUS/TDAP  01/23/2026   COLONOSCOPY (Pts 45-28yr Insurance coverage will need to be confirmed)   02/20/2027   Hepatitis C Screening  Completed   HIV Screening  Completed   HPV VACCINES  Aged Out    Discussed health benefits of physical activity, and encouraged him to engage in regular exercise appropriate for his age and condition.  Problem List Items Addressed This Visit       Cardiovascular and Mediastinum   Hypertension associated with diabetes ([facility]    Had a serious conversation w/ pt regarding the nature of his multiple uncontrolled chronic conditions.  Refilled Entresto and other HTN medications w/ refills so he will not miss med doses. Last visit I had referred him to [facility] to see what medication assistance programs he qualifies for. Pt declined call.  When I again stressed the importance of the entresto, pt states he has enough money and does not need assistance--despite not picking up the medication last month secondary to cost.  Strongly recommended he return to his cardiology d/t ongoing uncontrolled conditions and concerning symptoms       Relevant Medications   spironolactone (ALDACTONE) 25 MG tablet   sacubitril-valsartan (ENTRESTO) 97-103 MG   carvedilol (COREG) 12.5 MG tablet   metFORMIN (GLUCOPHAGE) 500 MG tablet   dapagliflozin propanediol (FARXIGA) 10 MG TABS tablet   Dulaglutide (TRULICITY) 04.19MFX/9.0WISOPN   Other Relevant Orders   Ambulatory referral to Cardiology   Chronic systolic heart failure ([place]    See HTN note. Ref back to cardiology likely needs repeat echo.       Relevant Medications   spironolactone (ALDACTONE) 25 MG tablet   sacubitril-valsartan (ENTRESTO) 97-103 MG   carvedilol (COREG) 12.5 MG tablet     Endocrine   Type 2 diabetes mellitus with diabetic neuropathy, without long-term current use of insulin (H[facility])    Pt dislike fingersticks; will order a1c bw.  Not expecting improvement as not consistent with medications. Long conversation on the importance of controlling diabetes.        Relevant Medications   metFORMIN  (GLUCOPHAGE) 500 MG tablet   dapagliflozin propanediol (FARXIGA) 10 MG TABS tablet   Dulaglutide (TRULICITY) 00.97MDZ/3.2DJSOPN   Other Relevant Orders   Urine Microalbumin w/creat. 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 Dislikes allopurinol, will take colchicine prn flare. Advised he can take 2 tabs, and an additional 1 tab in an hour as he states 1 tab does not control pain.        Relevant Orders   Uric acid (Completed)     Other   Dysarthria    Hx of TIA, I explained to patients these episodes are likely additional TIA Strict ED precautions, and advised pt his high risk of stroke and heart attack.  Pt had stopped ASA 81 mg daily -- strongly recommending he restart. Consistent with statin daily The 10-year ASCVD risk score (Arnett DK, et al., 2019) is: 27.5%        Palpitations    Again ref back to cardiology, may need repeat holter monitor and stress test. Unsure what syncopal episode was related to, but again discussed severity of symptoms and necessity to go to ED if occurs again       Other Visit Diagnoses     Encounter for health maintenance examination    -  Primary  Return in about 3 months (around 07/10/2022) for chronic conditions.     I, Mikey Kirschner, PA-C have reviewed all documentation for this visit. The documentation on  04/10/2022 for the exam, diagnosis, procedures, and orders are all accurate and complete.  Mikey Kirschner, PA-C [facility] [address] [place], [place], [address] Office: [phone] Fax: [phone]

## 2022-04-10 ENCOUNTER — Other Ambulatory Visit: Payer: Self-pay | Admitting: Physician Assistant

## 2022-04-10 ENCOUNTER — Encounter: Payer: Self-pay | Admitting: Physician Assistant

## 2022-04-10 DIAGNOSIS — R002 Palpitations: Secondary | ICD-10-CM | POA: Insufficient documentation

## 2022-04-10 DIAGNOSIS — M1A3710 Chronic gout due to renal impairment, right ankle and foot, without tophus (tophi): Secondary | ICD-10-CM

## 2022-04-10 DIAGNOSIS — E1140 Type 2 diabetes mellitus with diabetic neuropathy, unspecified: Secondary | ICD-10-CM

## 2022-04-10 LAB — URIC ACID

## 2022-04-10 LAB — HEMOGLOBIN A1C

## 2022-04-10 LAB — MICROALBUMIN / CREATININE URINE RATIO
Creatinine, Urine: 94 mg/dL
Microalb/Creat Ratio: 15 mg/g creat (ref 0–29)
Microalbumin, Urine: 13.8 ug/mL

## 2022-04-10 NOTE — Assessment & Plan Note (Addendum)
Hx of TIA, I explained to patients these episodes are likely additional TIA Strict ED precautions, and advised pt his high risk of stroke and heart attack.  Pt had stopped ASA 81 mg daily -- strongly recommending he restart. Consistent with statin daily The 10-year ASCVD risk score (Arnett DK, et al., 2019) is: 27.5%

## 2022-04-10 NOTE — Assessment & Plan Note (Signed)
Pt reports moderate control. Dislikes allopurinol, will take colchicine prn flare. Advised he can take 2 tabs, and an additional 1 tab in an hour as he states 1 tab does not control pain.

## 2022-04-10 NOTE — Assessment & Plan Note (Addendum)
Had a serious conversation w/ pt regarding the nature of his multiple uncontrolled chronic conditions.  Refilled Entresto and other HTN medications w/ refills so he will not miss med doses. Last visit I had referred him to [facility] to see what medication assistance programs he qualifies for. Pt declined call.  When I again stressed the importance of the entresto, pt states he has enough money and does not need assistance--despite not picking up the medication last month secondary to cost.  Strongly recommended he return to his cardiologist d/t ongoing uncontrolled conditions and concerning symptoms

## 2022-04-10 NOTE — Assessment & Plan Note (Signed)
Pt dislike fingersticks; will order a1c bw.  Not expecting improvement as not consistent with medications. Long conversation on the importance of controlling diabetes.

## 2022-04-10 NOTE — Assessment & Plan Note (Signed)
See HTN note. Ref back to cardiology likely needs repeat echo.

## 2022-04-10 NOTE — Assessment & Plan Note (Signed)
Again ref back to cardiology, may need repeat holter monitor and stress test. Unsure what syncopal episode was related to, but again discussed severity of symptoms and necessity to go to ED if occurs again

## 2022-04-19 NOTE — Progress Notes (Unsigned)
Cardiology Office Note  Date:  04/20/2022   ID:  Itay Mella, DOB 08/08/1971, MRN [id number]  PCP:  Mikey Kirschner, PA-C   Chief Complaint  Patient presents with   Hypertension    Ref by Huston Foley, PA-C for HTN. Patient c/o dry lips, headache, elevated blood pressure and shortness of breath with occasional fluttering in chest.     HPI:  Mr. Jeremie Giangrande is a 51 yo male with history of Smoker Prior history poorly controlled hypertension Presenting to the hospital 07/11/2017 with chest pain, shortness of breath Workup including cardiac catheterization showing nonobstructive coronary disease Ejection fraction 30-35% on echocardiogram 2018 Up to 35 to 40% 2020 Ejection fraction up to 40 to 45% in June 2022 Who presents for follow-up of his nonischemic hypertensivecardiomyopathy  Last seen in clinic by myself November 2020 Seen by one of our other providers May 2022 On that visit reported having episode of palpitations with possible presyncope episode   Testing reviewed Echocardiogram ejection fraction improving up to 40 to 45% Zio monitor was having frequent episodes of SVT/atrial tachycardia longest up to 8 minutes  Having gerd sx Not on ppi Previously on prevacid and had good symptom relief  Having rare episodes as detailed below Episodes of dry lips, word finding difficulty, Lasting 5-10 min Had  a couple episodes Missing meals, feels better after eating/candy, has instant relief, denies any other neurologic deficits  Episode of cough syncope in the past  Blood pressure better controlled on today's visit  EKG personally reviewed by myself on todays visit Shows normal sinus rhythm rate 72 bpm left bundle branch block  Hospital records reviewed with him as below [place] 07/11/2017:   Prox Cx lesion, 30 %stenosed. Prox RCA lesion, 30 %stenosed. Mid RCA to Dist RCA lesion, 30 %stenosed. There is moderate to severe left ventricular systolic dysfunction. LV end  diastolic pressure is moderately elevated. There is trivial (1+) mitral regurgitation.   1. Mild nonobstructive coronary artery disease. 2. Moderately to severely reduced LV systolic function with an EF of 30-35% likely due to hypertensive heart disease. 3. Moderately elevated left ventricular end-diastolic pressure. 4. Aortic root angiography showed no evidence of ascending aortic aneurysm or dissection.   Recommendations:  acute systolic heart failure due to hypertensive heart disease with new left bundle branch block.      TTE 07/11/2017: Transthoracic echocardiography. Image   quality was poor. The study was technically difficult, as a   result of poor acoustic windows and poor sound wave transmission.   Intravenous contrast (Definity) was administered. - Left ventricle: The cavity size was mildly dilated. There was   mild concentric hypertrophy. Systolic function was moderately   reduced. The estimated ejection fraction was in the range of 35%   to 40%. Diffuse hypokinesis. Features are consistent with a   pseudonormal left ventricular filling pattern, with concomitant   abnormal relaxation and increased filling pressure (grade 2   diastolic dysfunction). - Left atrium: The atrium was mildly dilated.  PMH:   has a past medical history of Chronic combined systolic (congestive) and diastolic (congestive) heart failure ([place]), Diabetes mellitus, Erectile dysfunction, Essential hypertension, Frequent headaches, NICM (nonischemic cardiomyopathy) ([place]), Non-obstructive Coronary Artery Disease, TIA (transient ischemic attack), and Tobacco abuse.  PSH:    Past Surgical History:  Procedure Laterality Date   COLONOSCOPY WITH PROPOFOL N/A 02/19/2017   Procedure: COLONOSCOPY WITH PROPOFOL;  Surgeon: Jonathon Bellows, MD;  Location: [facility];  Service: Endoscopy;  Laterality: N/A;   ESOPHAGOGASTRODUODENOSCOPY (EGD)  WITH PROPOFOL N/A 02/19/2017   Procedure: ESOPHAGOGASTRODUODENOSCOPY (EGD) WITH  PROPOFOL;  Surgeon: Jonathon Bellows, MD;  Location: [facility] ENDOSCOPY;  Service: Endoscopy;  Laterality: N/A;   LEFT HEART CATH AND CORONARY ANGIOGRAPHY N/A 07/11/2017   Procedure: LEFT HEART CATH AND CORONARY ANGIOGRAPHY;  Surgeon: Wellington Hampshire, MD;  Location: [facility];  Service: Cardiovascular;  Laterality: N/A;    Current Outpatient Medications  Medication Sig Dispense Refill   atorvastatin (LIPITOR) 40 MG tablet TAKE 1 TABLET BY MOUTH ONCE DAILY AT  6PM 90 tablet 2   carvedilol (COREG) 12.5 MG tablet Take 1 tablet (12.5 mg total) by mouth 2 (two) times daily. 180 tablet 1   colchicine 0.6 MG tablet Take 1 tablet (0.6 mg total) by mouth daily prn gout attack. 30 tablet 3   dapagliflozin propanediol (FARXIGA) 10 MG TABS tablet Take 1 tablet (10 mg total) by mouth daily before breakfast. 90 tablet 1   Dulaglutide (TRULICITY) 0.34 VQ/2.5ZD SOPN Inject 0.75 mg into the skin once a week. 12 mL 3   furosemide (LASIX) 40 MG tablet Take 1 tablet (40 mg total) by mouth daily. 90 tablet 0   glucose blood (ONETOUCH VERIO) test strip Use daily to check blood glucose 100 each 12   Lancets (ONETOUCH ULTRASOFT) lancets Use daily to check blood glucose 100 each 12   metFORMIN (GLUCOPHAGE) 500 MG tablet Take 2 tablets (1,000 mg total) by mouth 2 (two) times daily. 360 tablet 1   sacubitril-valsartan (ENTRESTO) 97-103 MG Take 1 tablet by mouth 2 (two) times daily. Pt needs to make appt with provider for further refills - 2nd attempt 180 tablet 1   sildenafil (VIAGRA) 50 MG tablet Take 1 tablet (50 mg total) by mouth as needed for erectile dysfunction. May take 50-100 mg as needed 30 tablet 6   spironolactone (ALDACTONE) 25 MG tablet Take 1 tablet (25 mg total) by mouth daily. 90 tablet 1   varenicline (CHANTIX CONTINUING MONTH PAK) 1 MG tablet Take 1 tablet (1 mg total) by mouth 2 (two) times daily. 60 tablet 3   allopurinol (ZYLOPRIM) 100 MG tablet Take 1 tablet (100 mg total) by mouth daily. (Patient  not taking: Reported on 04/09/2022) 90 tablet 3   aspirin 81 MG chewable tablet Chew 1 tablet (81 mg total) by mouth daily. (Patient not taking: Reported on 04/09/2022) 30 tablet 2   Blood Glucose Monitoring Suppl (ONETOUCH VERIO) w/Device KIT Use daily to check blood glucose (Patient not taking: Reported on 04/09/2022) 1 kit 0   No current facility-administered medications for this visit.    Allergies:   Other   Social History:  The patient  reports that he has been smoking cigarettes. He has been smoking an average of 1 pack per day. He has never used smokeless tobacco. He reports current alcohol use. He reports that he does not use drugs.   Family History:   family history includes Diabetes in his father and mother; Hypertension in his father and mother.    Review of Systems: Review of Systems  Constitutional: Negative.   HENT: Negative.    Respiratory: Negative.    Cardiovascular: Negative.   Gastrointestinal: Negative.   Musculoskeletal: Negative.   Neurological: Negative.   Psychiatric/Behavioral: Negative.    All other systems reviewed and are negative.  PHYSICAL EXAM: VS:  BP 130/88 (BP Location: Left Arm, Patient Position: Sitting, Cuff Size: Normal)   Pulse 72   Ht 5' 11"  (1.803 m)   Wt 213 lb  8 oz (96.8 kg)   SpO2 98%   BMI 29.78 kg/m  , BMI Body mass index is 29.78 kg/m. Constitutional:  oriented to person, place, and time. No distress.  HENT:  Head: Grossly normal Eyes:  no discharge. No scleral icterus.  Neck: No JVD, no carotid bruits  Cardiovascular: Regular rate and rhythm, no murmurs appreciated Pulmonary/Chest: Clear to auscultation bilaterally, no wheezes or rails Abdominal: Soft.  no distension.  no tenderness.  Musculoskeletal: Normal range of motion Neurological:  normal muscle tone. Coordination normal. No atrophy Skin: Skin warm and dry Psychiatric: normal affect, pleasant  Recent Labs: 12/29/2021: ALT 12; BUN 17; Creatinine, Ser 1.18; Potassium  4.5; Sodium 141    Lipid Panel Lab Results  Component Value Date   CHOL 155 12/29/2021   HDL 32 (L) 12/29/2021   LDLCALC 80 12/29/2021   TRIG 263 (H) 12/29/2021      Wt Readings from Last 3 Encounters:  04/20/22 213 lb 8 oz (96.8 kg)  04/09/22 213 lb 3.2 oz (96.7 kg)  12/29/21 215 lb 11.2 oz (97.8 kg)     ASSESSMENT AND PLAN:  Dilated cardiomyopathy ([place]) - Plan: EKG 12-Lead,  felt secondary to hypertensive heart disease Slow improvement in ejection fraction since 2018 Recommend he continue current medications including carvedilol 12.5 twice daily, recently started Farxiga, Entresto 97/103 mg twice daily, spironolactone 25 daily Also on Lasix daily  Acute systolic heart failure ([place]) Euvolemic, notices a difference when he does not take his Lasix  Hypertensive urgency Blood pressure is well controlled on today's visit. No changes made to the medications.  Episodes of mental status change Etiology included, seems to improve after he has a piece of candy or a snack May have to and more often if he misses a meal Recommend he closely monitor symptoms, try not to miss meals especially as he is now restarting [place]  Coronary artery disease of native artery of native heart with stable angina pectoris ([place]) Currently with no symptoms of angina. No further workup at this time. Continue current medication regimen.  DM (diabetes mellitus) with complications (HCC) Weight stable A1c 8.5, has been trending higher Discussed dietary changes   Total encounter time more than 40 minutes  Greater than 50% was spent in counseling and coordination of care with the patient    Orders Placed This Encounter  Procedures   EKG 12-Lead     Signed, Esmond Plants, M.D., Ph.D. 04/20/2022  [place], [place]

## 2022-04-20 ENCOUNTER — Ambulatory Visit: Payer: Managed Care, Other (non HMO) | Admitting: Cardiovascular Disease

## 2022-04-20 ENCOUNTER — Encounter: Payer: Self-pay | Admitting: Cardiovascular Disease

## 2022-04-20 VITALS — BP 130/88 | HR 72 | Ht 71.0 in | Wt 213.5 lb

## 2022-04-20 DIAGNOSIS — I5020 Unspecified systolic (congestive) heart failure: Secondary | ICD-10-CM | POA: Diagnosis not present

## 2022-04-20 DIAGNOSIS — I25118 Atherosclerotic heart disease of native coronary artery with other forms of angina pectoris: Secondary | ICD-10-CM | POA: Diagnosis not present

## 2022-04-20 DIAGNOSIS — E782 Mixed hyperlipidemia: Secondary | ICD-10-CM

## 2022-04-20 DIAGNOSIS — R002 Palpitations: Secondary | ICD-10-CM

## 2022-04-20 DIAGNOSIS — Z8679 Personal history of other diseases of the circulatory system: Secondary | ICD-10-CM | POA: Diagnosis not present

## 2022-04-20 DIAGNOSIS — I471 Supraventricular tachycardia: Secondary | ICD-10-CM

## 2022-04-20 DIAGNOSIS — E119 Type 2 diabetes mellitus without complications: Secondary | ICD-10-CM

## 2022-04-20 MED ORDER — VARENICLINE TARTRATE 1 MG PO TABS: 1.0000 mg | ORAL_TABLET | Freq: Two times a day (BID) | ORAL | 3 refills | Status: DC

## 2022-04-20 NOTE — Patient Instructions (Addendum)
Medication Instructions:  Chantix 1 mg twice a day  If you need a refill on your cardiac medications before your next appointment, please call your pharmacy.   Lab work: No new labs needed  Testing/Procedures: No new testing needed  Follow-Up: At [facility], you and your health needs are our priority.  As part of our continuing mission to provide you with exceptional heart care, we have created designated Provider Care Teams.  These Care Teams include your primary Cardiologist (physician) and Advanced Practice Providers (APPs -  Physician Assistants and Nurse Practitioners) who all work together to provide you with the care you need, when you need it.  You will need a follow up appointment in 6 months  Providers on your designated Care Team:   Nicolasa Ducking, NP Eula Listen, PA-C Cadence Fransico Michael, [place]  COVID-19 Vaccine Information can be found at: [email or web address] For questions related to vaccine distribution or appointments, please email [email or web address] or call 5100119495.

## 2022-04-27 ENCOUNTER — Encounter: Payer: Self-pay | Admitting: Physician Assistant

## 2022-05-27 ENCOUNTER — Ambulatory Visit: Payer: Self-pay | Admitting: *Deleted

## 2022-05-27 NOTE — Telephone Encounter (Signed)
  Chief Complaint: foot swelling Symptoms: redness Frequency: constant Pertinent Negatives: Patient denies fever Disposition: [] ED /[] Urgent Care (no appt availability in office) / [x] Appointment(In office/virtual)/ []  [facility]/ [] Home Care/ [] Refused Recommended Disposition /[] [facility]/ []  Follow-up with PCP Additional Notes: Pt had later appt, was able to move it up. Pt can not make the open appt tomorrow, has one for Friday.  Reason for Disposition  [1] Redness of the skin AND [2] no fever  Answer Assessment - Initial Assessment Questions 1. ONSET: "When did the pain start?"      5 days 2. LOCATION: "Where is the pain located?"      foot 3. PAIN: "How bad is the pain?"    (Scale 1-10; or mild, moderate, severe)  - MILD (1-3): doesn't interfere with normal activities.   - MODERATE (4-7): interferes with normal activities (e.g., work or school) or awakens from sleep, limping.   - SEVERE (8-10): excruciating pain, unable to do any normal activities, unable to walk.      Pretty bad 4. WORK OR EXERCISE: "Has there been any recent work or exercise that involved this part of the body?"      no 5. CAUSE: "What do you think is causing the foot pain?"     unknown 6. OTHER SYMPTOMS: "Do you have any other symptoms?" (e.g., leg pain, rash, fever, numbness)     Red, no rash, has bruise 7. PREGNANCY: "Is there any chance you are pregnant?" "When was your last menstrual period?"     na  Protocols used: Foot Pain-A-AH

## 2022-05-28 NOTE — Progress Notes (Deleted)
      Established patient visit   Patient: Keith [name]   DOB: June 14, 1971   51 y.o. Male  MRN: [id number] Visit Date: 05/29/2022  Today's healthcare provider: Mardene Speak, PA-C   No chief complaint on file. Foot pain  Subjective      Medications: Outpatient Medications Prior to Visit  Medication Sig   allopurinol (ZYLOPRIM) 100 MG tablet Take 1 tablet (100 mg total) by mouth daily. (Patient not taking: Reported on 04/09/2022)   aspirin 81 MG chewable tablet Chew 1 tablet (81 mg total) by mouth daily. (Patient not taking: Reported on 04/09/2022)   atorvastatin (LIPITOR) 40 MG tablet TAKE 1 TABLET BY MOUTH ONCE DAILY AT  6PM   Blood Glucose Monitoring Suppl (ONETOUCH VERIO) w/Device KIT Use daily to check blood glucose (Patient not taking: Reported on 04/09/2022)   carvedilol (COREG) 12.5 MG tablet Take 1 tablet (12.5 mg total) by mouth 2 (two) times daily.   colchicine 0.6 MG tablet Take 1 tablet (0.6 mg total) by mouth daily prn gout attack.   dapagliflozin propanediol (FARXIGA) 10 MG TABS tablet Take 1 tablet (10 mg total) by mouth daily before breakfast.   Dulaglutide (TRULICITY) 8.52 DP/8.2UM SOPN Inject 0.75 mg into the skin once a week.   furosemide (LASIX) 40 MG tablet Take 1 tablet (40 mg total) by mouth daily.   glucose blood (ONETOUCH VERIO) test strip Use daily to check blood glucose   Lancets (ONETOUCH ULTRASOFT) lancets Use daily to check blood glucose   metFORMIN (GLUCOPHAGE) 500 MG tablet Take 2 tablets (1,000 mg total) by mouth 2 (two) times daily.   sacubitril-valsartan (ENTRESTO) 97-103 MG Take 1 tablet by mouth 2 (two) times daily. Pt needs to make appt with provider for further refills - 2nd attempt   sildenafil (VIAGRA) 50 MG tablet Take 1 tablet (50 mg total) by mouth as needed for erectile dysfunction. May take 50-100 mg as needed   spironolactone (ALDACTONE) 25 MG tablet Take 1 tablet (25 mg total) by mouth daily.   varenicline (CHANTIX CONTINUING MONTH  PAK) 1 MG tablet Take 1 tablet (1 mg total) by mouth 2 (two) times daily.   No facility-administered medications prior to visit.    Review of Systems  {Labs  Heme  Chem  Endocrine  Serology  Results Review (optional):23779}   Objective    There were no vitals taken for this visit. {Show previous vital signs (optional):23777}  Physical Exam  ***  No results found for any visits on 05/29/22.  Assessment & Plan     ***  No follow-ups on file.      {provider attestation***:1}   Mardene Speak, Hershal Coria  [facility] [phone] (phone) [phone] (fax)  [place]

## 2022-05-29 ENCOUNTER — Ambulatory Visit: Payer: Managed Care, Other (non HMO) | Admitting: Physician Assistant

## 2022-06-01 ENCOUNTER — Ambulatory Visit: Payer: Managed Care, Other (non HMO) | Admitting: Physician Assistant

## 2022-06-04 ENCOUNTER — Ambulatory Visit: Payer: Managed Care, Other (non HMO) | Admitting: Physician Assistant

## 2022-06-04 ENCOUNTER — Ambulatory Visit: Payer: Self-pay

## 2022-06-04 ENCOUNTER — Encounter: Payer: Self-pay | Admitting: Family Medicine

## 2022-06-04 ENCOUNTER — Ambulatory Visit: Payer: Managed Care, Other (non HMO) | Admitting: Family Medicine

## 2022-06-04 VITALS — BP 122/82 | HR 87 | Temp 98.7°F | Resp 16 | Wt 210.9 lb

## 2022-06-04 DIAGNOSIS — E1142 Type 2 diabetes mellitus with diabetic polyneuropathy: Secondary | ICD-10-CM | POA: Diagnosis not present

## 2022-06-04 DIAGNOSIS — K5792 Diverticulitis of intestine, part unspecified, without perforation or abscess without bleeding: Secondary | ICD-10-CM | POA: Insufficient documentation

## 2022-06-04 DIAGNOSIS — I739 Peripheral vascular disease, unspecified: Secondary | ICD-10-CM

## 2022-06-04 DIAGNOSIS — Z125 Encounter for screening for malignant neoplasm of prostate: Secondary | ICD-10-CM | POA: Insufficient documentation

## 2022-06-04 DIAGNOSIS — R300 Dysuria: Secondary | ICD-10-CM | POA: Insufficient documentation

## 2022-06-04 DIAGNOSIS — M10372 Gout due to renal impairment, left ankle and foot: Secondary | ICD-10-CM | POA: Insufficient documentation

## 2022-06-04 LAB — POCT URINALYSIS DIPSTICK
Bilirubin, UA: NEGATIVE
Blood, UA: NEGATIVE
Glucose, UA: NEGATIVE
Ketones, UA: NEGATIVE
Leukocytes, UA: NEGATIVE
Nitrite, UA: NEGATIVE
Protein, UA: NEGATIVE
Spec Grav, UA: 1.01 (ref 1.010–1.025)
Urobilinogen, UA: 0.2 E.U./dL
pH, UA: 7 (ref 5.0–8.0)

## 2022-06-04 MED ORDER — BUPROPION HCL ER (XL) 150 MG PO TB24: 300.0000 mg | ORAL_TABLET | Freq: Every day | ORAL | 0 refills | Status: DC

## 2022-06-04 NOTE — Assessment & Plan Note (Signed)
Likely related to LUTS Negative UA Negative CVA pain Hold culture

## 2022-06-04 NOTE — Assessment & Plan Note (Signed)
Has been icing to assist; low suspicious for gout vs PAD Check uric acid today

## 2022-06-04 NOTE — Assessment & Plan Note (Addendum)
Repeat A1c; chronic, previous elevated Continue to recommend balanced, lower carb meals. Smaller meal size, adding snacks. Choosing water as drink of choice and increasing purposeful exercise.

## 2022-06-04 NOTE — Assessment & Plan Note (Addendum)
Chronic, worsening With change in nerve pain, appearance and edema Recommend urgent referral to vascular [facility] discussion regarding importance of smoking cessation, bp control and DM control Encourage use of wellbutrin to assist with cessation efforts; failed chantix d/t side effects

## 2022-06-04 NOTE — Assessment & Plan Note (Signed)
Encourage use of miralax and metamucil to assist encourage bland diet and CT if exacerbated

## 2022-06-04 NOTE — Progress Notes (Signed)
I,Sulibeya S Dimas,acting as a Education administrator for Gwyneth Sprout, FNP.,have documented all relevant documentation on the behalf of Gwyneth Sprout, FNP,as directed by  Gwyneth Sprout, FNP while in the presence of Gwyneth Sprout, FNP.   Established patient visit   Patient: Keith [name]   DOB: 09-Nov-1971   51 y.o. Male  MRN: [id number] Visit Date: 06/04/2022  Today's healthcare provider: Gwyneth Sprout, FNP  Introduced to nurse practitioner role and practice setting.  All questions answered.  Discussed provider/patient relationship and expectations.   Chief Complaint  Patient presents with   Abdominal Pain   Leg Swelling   Subjective    Abdominal Pain This is a new problem. The current episode started in the past 7 days. The onset quality is sudden. The problem occurs constantly. The problem has been waxing and waning. The pain is located in the LLQ and left flank. The pain is mild. The quality of the pain is aching and sharp. The abdominal pain radiates to the left flank. Associated symptoms include diarrhea, dysuria, frequency and nausea. Pertinent negatives include no constipation, fever, flatus, hematuria or melena. The pain is aggravated by being still and bowel movement. The pain is relieved by Nothing. Treatments tried: AZO. The treatment provided no relief.     Patient C/O recurrent and worsening swelling, redness, and feels hot. Patient reports symptoms have been present for several years. Worsening recently. He reports having to ice his right leg almost every night.   Medications: Outpatient Medications Prior to Visit  Medication Sig   allopurinol (ZYLOPRIM) 100 MG tablet Take 1 tablet (100 mg total) by mouth daily.   aspirin 81 MG chewable tablet Chew 1 tablet (81 mg total) by mouth daily.   atorvastatin (LIPITOR) 40 MG tablet TAKE 1 TABLET BY MOUTH ONCE DAILY AT  6PM   carvedilol (COREG) 12.5 MG tablet Take 1 tablet (12.5 mg total) by mouth 2 (two) times daily.   colchicine  0.6 MG tablet Take 1 tablet (0.6 mg total) by mouth daily prn gout attack.   dapagliflozin propanediol (FARXIGA) 10 MG TABS tablet Take 1 tablet (10 mg total) by mouth daily before breakfast.   Dulaglutide (TRULICITY) 0.97 DZ/3.2DJ SOPN Inject 0.75 mg into the skin once a week.   furosemide (LASIX) 40 MG tablet Take 1 tablet (40 mg total) by mouth daily.   metFORMIN (GLUCOPHAGE) 500 MG tablet Take 2 tablets (1,000 mg total) by mouth 2 (two) times daily.   sacubitril-valsartan (ENTRESTO) 97-103 MG Take 1 tablet by mouth 2 (two) times daily. Pt needs to make appt with provider for further refills - 2nd attempt   sildenafil (VIAGRA) 50 MG tablet Take 1 tablet (50 mg total) by mouth as needed for erectile dysfunction. May take 50-100 mg as needed   spironolactone (ALDACTONE) 25 MG tablet Take 1 tablet (25 mg total) by mouth daily.   Blood Glucose Monitoring Suppl (ONETOUCH VERIO) w/Device KIT Use daily to check blood glucose (Patient not taking: Reported on 04/09/2022)   glucose blood (ONETOUCH VERIO) test strip Use daily to check blood glucose (Patient not taking: Reported on 06/04/2022)   Lancets (ONETOUCH ULTRASOFT) lancets Use daily to check blood glucose (Patient not taking: Reported on 06/04/2022)   varenicline (CHANTIX CONTINUING MONTH PAK) 1 MG tablet Take 1 tablet (1 mg total) by mouth 2 (two) times daily. (Patient not taking: Reported on 06/04/2022)   No facility-administered medications prior to visit.    Review of Systems  Constitutional:  Negative for chills and fever.  Respiratory:  Negative for shortness of breath.   Cardiovascular:  Positive for leg swelling. Negative for chest pain.  Gastrointestinal:  Positive for abdominal pain, diarrhea and nausea. Negative for blood in stool, constipation, flatus and melena.  Genitourinary:  Positive for dysuria, flank pain and frequency. Negative for hematuria.  Skin:  Positive for color change and rash.  Neurological:  Negative for weakness and  numbness.     Objective    BP 122/82 (BP Location: Left Arm, Cuff Size: Large)   Pulse 87   Temp 98.7 F (37.1 C) (Oral)   Resp 16   Wt 210 lb 14.4 oz (95.7 kg)   SpO2 97%   BMI 29.41 kg/m    Physical Exam Vitals and nursing note reviewed.  Constitutional:      Appearance: Normal appearance. He is well-developed and overweight.  HENT:     Head: Normocephalic and atraumatic.  Eyes:     Pupils: Pupils are equal, round, and reactive to light.  Cardiovascular:     Rate and Rhythm: Normal rate and regular rhythm.     Pulses: Normal pulses.     Heart sounds: Normal heart sounds.  Pulmonary:     Effort: Pulmonary effort is normal.     Breath sounds: Normal breath sounds.  Abdominal:     General: Abdomen is flat. Bowel sounds are normal. There is no distension or abdominal bruit. There are no signs of injury.     Palpations: Abdomen is soft.     Tenderness: There is no abdominal tenderness. There is no right CVA tenderness, left CVA tenderness, guarding or rebound.     Hernia: No hernia is present.     Comments: Reports slight tenderness to LLQ  Musculoskeletal:        General: Normal range of motion.     Cervical back: Normal range of motion.  Skin:    General: Skin is warm and dry.     Capillary Refill: Capillary refill takes less than 2 seconds.  Neurological:     General: No focal deficit present.     Mental Status: He is alert and oriented to person, place, and time. Mental status is at baseline.  Psychiatric:        Mood and Affect: Mood normal. Mood is not anxious or depressed.        Behavior: Behavior normal.      Results for orders placed or performed in visit on 06/04/22  POCT urinalysis dipstick  Result Value Ref Range   Color, UA yellow    Clarity, UA dark    Glucose, UA Negative Negative   Bilirubin, UA Negative    Ketones, UA Negative    Spec Grav, UA 1.010 1.010 - 1.025   Blood, UA Negative    pH, UA 7.0 5.0 - 8.0   Protein, UA Negative Negative    Urobilinogen, UA 0.2 0.2 or 1.0 E.U./dL   Nitrite, UA Negative    Leukocytes, UA Negative Negative    Assessment & Plan     Problem List Items Addressed This Visit       Cardiovascular and Mediastinum   PAD (peripheral artery disease) (HCC)    Chronic, worsening With change in nerve pain, appearance and edema Recommend urgent referral to vascular [place] discussion regarding importance of smoking cessation, bp control and DM control Encourage use of wellbutrin to assist with cessation efforts; failed chantix d/t side effects  Relevant Orders   Ambulatory referral to Vascular Surgery     Endocrine   Type 2 diabetes mellitus with diabetic polyneuropathy, without long-term current use of insulin (HCC)    Repeat A1c; chronic, previous elevated Continue to recommend balanced, lower carb meals. Smaller meal size, adding snacks. Choosing water as drink of choice and increasing purposeful exercise.       Relevant Medications   buPROPion (WELLBUTRIN XL) 150 MG 24 hr tablet   Other Relevant Orders   Comprehensive metabolic panel   CBC with Differential/Platelet   Hemoglobin A1c   Ambulatory referral to Vascular Surgery     Musculoskeletal and Integument   Acute gout due to renal impairment involving left foot    Has been icing to assist; low suspicious for gout vs PAD Check uric acid today       Relevant Orders   Uric acid     Other   Diverticulitis - Primary    Encourage use of miralax and metamucil to assist encourage bland diet and CT if exacerbated       Dysuria    Likely related to LUTS Negative UA Negative CVA pain Hold culture       Relevant Orders   POCT urinalysis dipstick (Completed)   Screening for prostate cancer    Report of LUTS including straining, dribbling, hesitancy  Due for PSA      Relevant Orders   PSA     Return in about 3 months (around 09/04/2022).      Vonna Kotyk, FNP, have reviewed all documentation for this  visit. The documentation on 06/04/22 for the exam, diagnosis, procedures, and orders are all accurate and complete.    Gwyneth Sprout, [place] [phone] (phone) [phone] (fax)  [place]

## 2022-06-04 NOTE — Telephone Encounter (Signed)
This patient was seen this morning with Merita Norton, FNP for this issue.

## 2022-06-04 NOTE — Telephone Encounter (Signed)
    Chief Complaint: Pain with urination, abdominal pain. No availability today, asking to be worked in. Symptoms: Above Frequency: Yesterday Pertinent Negatives: Patient denies fever Disposition: [] ED /[] Urgent Care (no appt availability in office) / [] Appointment(In office/virtual)/ []  [facility]/ [] Home Care/ [] Refused Recommended Disposition /[] [facility]/ [x]  Follow-up with PCP Additional Notes: Please advise pt.   Answer Assessment - Initial Assessment Questions 1. SYMPTOM: "What's the main symptom you're concerned about?" (e.g., frequency, incontinence)     Painful urination, nausea, abdominal pain 2. ONSET: "When did the    start?"     Yesterday 3. PAIN: "Is there any pain?" If Yes, ask: "How bad is it?" (Scale: 1-10; mild, moderate, severe)     Severe 4. CAUSE: "What do you think is causing the symptoms?"     UTI 5. OTHER SYMPTOMS: "Do you have any other symptoms?" (e.g., blood in urine, fever, flank pain, pain with urination)     No 6. PREGNANCY: "Is there any chance you are pregnant?" "When was your last menstrual period?"     N/a  Protocols used: Urinary Symptoms-A-AH

## 2022-06-04 NOTE — Assessment & Plan Note (Signed)
Report of LUTS including straining, dribbling, hesitancy  Due for PSA

## 2022-06-05 LAB — COMPREHENSIVE METABOLIC PANEL
ALT: 18 IU/L (ref 0–44)
AST: 17 IU/L (ref 0–40)
Albumin/Globulin Ratio: 1.8 (ref 1.2–2.2)
Albumin: 4.4 g/dL (ref 3.8–4.9)
Alkaline Phosphatase: 94 IU/L (ref 44–121)
BUN/Creatinine Ratio: 16 (ref 9–20)
BUN: 13 mg/dL (ref 6–24)
Bilirubin Total: 1.2 mg/dL (ref 0.0–1.2)
CO2: 22 mmol/L (ref 20–29)
Calcium: 10 mg/dL (ref 8.7–10.2)
Chloride: 99 mmol/L (ref 96–106)
Creatinine, Ser: 0.8 mg/dL (ref 0.76–1.27)
Globulin, Total: 2.5 g/dL (ref 1.5–4.5)
Glucose: 175 mg/dL — ABNORMAL HIGH (ref 70–99)
Potassium: 4.3 mmol/L (ref 3.5–5.2)
Sodium: 136 mmol/L (ref 134–144)
Total Protein: 6.9 g/dL (ref 6.0–8.5)
eGFR: 107 mL/min/{1.73_m2} (ref >59)

## 2022-06-05 LAB — CBC WITH DIFFERENTIAL/PLATELET
Basophils Absolute: 0 10*3/uL (ref 0.0–0.2)
Basos: 0 %
EOS (ABSOLUTE): 0.2 10*3/uL (ref 0.0–0.4)
Eos: 2 %
Hematocrit: 46.7 % (ref 37.5–51.0)
Hemoglobin: 16.5 g/dL (ref 13.0–17.7)
Immature Grans (Abs): 0 10*3/uL (ref 0.0–0.1)
Immature Granulocytes: 0 %
Lymphocytes Absolute: 1.4 10*3/uL (ref 0.7–3.1)
Lymphs: 19 %
MCH: 30.1 pg (ref 26.6–33.0)
MCHC: 35.3 g/dL (ref 31.5–35.7)
MCV: 85 fL (ref 79–97)
Monocytes Absolute: 0.3 10*3/uL (ref 0.1–0.9)
Monocytes: 4 %
Neutrophils Absolute: 5.5 10*3/uL (ref 1.4–7.0)
Neutrophils: 75 %
Platelets: 199 10*3/uL (ref 150–450)
RBC: 5.48 x10E6/uL (ref 4.14–5.80)
RDW: 12.7 % (ref 11.6–15.4)
WBC: 7.4 10*3/uL (ref 3.4–10.8)

## 2022-06-05 LAB — HEMOGLOBIN A1C
Est. average glucose Bld gHb Est-mCnc: 209 mg/dL
Hgb A1c MFr Bld: 8.9 % — ABNORMAL HIGH (ref 4.8–5.6)

## 2022-06-05 LAB — URIC ACID: Uric Acid: 5.6 mg/dL (ref 3.8–8.4)

## 2022-06-05 LAB — PSA: Prostate Specific Ag, Serum: 0.5 ng/mL (ref 0.0–4.0)

## 2022-06-05 NOTE — Progress Notes (Signed)
Blood chemistry stable with improved elevated blood sugar.   A1c remains elevated. Continue to recommend balanced, lower carb meals. Smaller meal size, adding snacks. Choosing water as drink of choice and increasing purposeful exercise. Recommend use of daily insulin to assist or GLP-1 to assist.  Normal cell count.  Normal uric acid.  Normal PSA. Urinary complaints could be related to elevated blood sugar average. Please follow up with PCP.  [name], FNP  [facility] [address] [place], [place] [address] [phone] (phone) [phone] (fax) [facility]

## 2022-06-08 ENCOUNTER — Ambulatory Visit: Payer: Managed Care, Other (non HMO) | Admitting: Physician Assistant

## 2022-06-14 ENCOUNTER — Encounter (INDEPENDENT_AMBULATORY_CARE_PROVIDER_SITE_OTHER): Payer: Self-pay

## 2022-08-24 ENCOUNTER — Other Ambulatory Visit: Payer: Self-pay | Admitting: Physician Assistant

## 2022-08-24 DIAGNOSIS — I5020 Unspecified systolic (congestive) heart failure: Secondary | ICD-10-CM

## 2022-08-24 DIAGNOSIS — I5022 Chronic systolic (congestive) heart failure: Secondary | ICD-10-CM

## 2022-08-25 MED ORDER — FUROSEMIDE 40 MG PO TABS: 40.0000 mg | ORAL_TABLET | Freq: Every day | ORAL | 0 refills | Status: DC

## 2022-09-06 ENCOUNTER — Other Ambulatory Visit: Payer: Self-pay | Admitting: Cardiovascular Disease

## 2022-09-14 ENCOUNTER — Encounter (INDEPENDENT_AMBULATORY_CARE_PROVIDER_SITE_OTHER): Payer: Self-pay

## 2022-09-21 ENCOUNTER — Encounter (INDEPENDENT_AMBULATORY_CARE_PROVIDER_SITE_OTHER): Payer: Managed Care, Other (non HMO) | Admitting: Nurse Practitioner

## 2022-09-21 ENCOUNTER — Encounter (INDEPENDENT_AMBULATORY_CARE_PROVIDER_SITE_OTHER): Payer: Managed Care, Other (non HMO)

## 2022-10-05 ENCOUNTER — Encounter: Payer: Self-pay | Admitting: Physician Assistant

## 2022-10-06 ENCOUNTER — Ambulatory Visit: Payer: Managed Care, Other (non HMO) | Admitting: Family Medicine

## 2022-10-06 ENCOUNTER — Encounter: Payer: Self-pay | Admitting: Family Medicine

## 2022-10-06 VITALS — BP 138/88 | HR 92 | Resp 17 | Wt 215.0 lb

## 2022-10-06 DIAGNOSIS — E1140 Type 2 diabetes mellitus with diabetic neuropathy, unspecified: Secondary | ICD-10-CM

## 2022-10-06 DIAGNOSIS — E1159 Type 2 diabetes mellitus with other circulatory complications: Secondary | ICD-10-CM

## 2022-10-06 DIAGNOSIS — I5022 Chronic systolic (congestive) heart failure: Secondary | ICD-10-CM

## 2022-10-06 DIAGNOSIS — I152 Hypertension secondary to endocrine disorders: Secondary | ICD-10-CM

## 2022-10-06 DIAGNOSIS — K2100 Gastro-esophageal reflux disease with esophagitis, without bleeding: Secondary | ICD-10-CM | POA: Insufficient documentation

## 2022-10-06 DIAGNOSIS — K143 Hypertrophy of tongue papillae: Secondary | ICD-10-CM | POA: Insufficient documentation

## 2022-10-06 DIAGNOSIS — I5020 Unspecified systolic (congestive) heart failure: Secondary | ICD-10-CM

## 2022-10-06 DIAGNOSIS — M1A3710 Chronic gout due to renal impairment, right ankle and foot, without tophus (tophi): Secondary | ICD-10-CM

## 2022-10-06 LAB — POCT RAPID STREP A (OFFICE): Rapid Strep A Screen: NEGATIVE

## 2022-10-06 MED ORDER — VARENICLINE TARTRATE 1 MG PO TABS: 1.0000 mg | ORAL_TABLET | Freq: Two times a day (BID) | ORAL | 3 refills | Status: DC

## 2022-10-06 MED ORDER — SACUBITRIL-VALSARTAN 97-103 MG PO TABS: 1.0000 | ORAL_TABLET | Freq: Two times a day (BID) | ORAL | 1 refills | Status: DC

## 2022-10-06 MED ORDER — COLCHICINE 0.6 MG PO TABS: ORAL_TABLET | ORAL | 3 refills | Status: DC

## 2022-10-06 MED ORDER — NYSTATIN 100000 UNIT/ML MT SUSP: 5.0000 mL | Freq: Four times a day (QID) | OROMUCOSAL | 0 refills | Status: DC

## 2022-10-06 MED ORDER — CARVEDILOL 12.5 MG PO TABS: 12.5000 mg | ORAL_TABLET | Freq: Two times a day (BID) | ORAL | 1 refills | Status: DC

## 2022-10-06 MED ORDER — ALLOPURINOL 100 MG PO TABS: 100.0000 mg | ORAL_TABLET | Freq: Every day | ORAL | 3 refills | Status: DC

## 2022-10-06 MED ORDER — DAPAGLIFLOZIN PROPANEDIOL 10 MG PO TABS: 10.0000 mg | ORAL_TABLET | Freq: Every day | ORAL | 1 refills | Status: DC

## 2022-10-06 MED ORDER — SPIRONOLACTONE 25 MG PO TABS: 25.0000 mg | ORAL_TABLET | Freq: Every day | ORAL | 1 refills | Status: DC

## 2022-10-06 MED ORDER — BUPROPION HCL ER (XL) 150 MG PO TB24: 300.0000 mg | ORAL_TABLET | Freq: Every day | ORAL | 3 refills | Status: DC

## 2022-10-06 MED ORDER — METFORMIN HCL ER 500 MG PO TB24: 1000.0000 mg | ORAL_TABLET | Freq: Two times a day (BID) | ORAL | 3 refills | Status: DC

## 2022-10-06 MED ORDER — SILDENAFIL CITRATE 100 MG PO TABS: 100.0000 mg | ORAL_TABLET | Freq: Every day | ORAL | 5 refills | Status: DC | PRN

## 2022-10-06 MED ORDER — BREZTRI AEROSPHERE 160-9-4.8 MCG/ACT IN AERO: 2.0000 | INHALATION_SPRAY | Freq: Two times a day (BID) | RESPIRATORY_TRACT | 11 refills | Status: DC

## 2022-10-06 MED ORDER — ATORVASTATIN CALCIUM 40 MG PO TABS: ORAL_TABLET | ORAL | 2 refills | Status: DC

## 2022-10-06 MED ORDER — FUROSEMIDE 40 MG PO TABS: 40.0000 mg | ORAL_TABLET | Freq: Every day | ORAL | 1 refills | Status: DC

## 2022-10-06 MED ORDER — PANTOPRAZOLE SODIUM 40 MG PO TBEC: 40.0000 mg | DELAYED_RELEASE_TABLET | Freq: Every day | ORAL | 0 refills | Status: DC

## 2022-10-06 NOTE — Assessment & Plan Note (Signed)
Acute, stable No concern for angioedema VSS Denies sick contacts Encouraged to change his toothbrush Trial of nystatin to assist Continue to recommend smoking cessation; pt remains pre-contemplative

## 2022-10-06 NOTE — Assessment & Plan Note (Signed)
Chronic, stable Appears euvolemic today; however, weight is increased from previous OV in 05/2022 Request refill of previous medications Has an upcoming cardiology appt per pt report

## 2022-10-06 NOTE — Progress Notes (Signed)
Established patient visit   Patient: Keith [name]   DOB: Oct 04, 1971   51 y.o. Male  MRN: [id number] Visit Date: 10/06/2022  Today's healthcare provider: Gwyneth Sprout, FNP  Re Introduced to nurse practitioner role and practice setting.  All questions answered.  Discussed provider/patient relationship and expectations.  I,Tiffany J Bragg,acting as a scribe for Gwyneth Sprout, FNP.,have documented all relevant documentation on the behalf of Gwyneth Sprout, FNP,as directed by  Gwyneth Sprout, FNP while in the presence of Gwyneth Sprout, FNP.   Chief Complaint  Patient presents with   Oral Swelling    Patient complains of tongue swelling and bumps starting yesterday morning. States his tongue feels like sand paper now.    Subjective    HPI HPI     Oral Swelling    Additional comments: Patient complains of tongue swelling and bumps starting yesterday morning. States his tongue feels like sand paper now.       Last edited by Smitty Knudsen, CMA on 10/06/2022  4:28 PM.      Patient complains of <24 hours of symptoms; which started as feeling like his tongue was enlarged/swollen in the middle followed back white coating. He notes the area looked like "tumors".  Denies new foods, denies changes in sexual partners, denies new Rx or OTC medications, denies sick contacts, denies sick contacts  Endorses dental caries and need for fillings and possible a root canal; however, has not seen a dentist recently d/t financial concern but he brushes his teeth after meals as well as his tounge. Endorses change in tobacco brand roughly 3-4 months ago. Endorses previous complains of mouth swelling and "acid" bumps.  Medications: Outpatient Medications Prior to Visit  Medication Sig   aspirin 81 MG chewable tablet Chew 1 tablet (81 mg total) by mouth daily.   Blood Glucose Monitoring Suppl (ONETOUCH VERIO) w/Device KIT Use daily to check blood glucose   Dulaglutide (TRULICITY) 2.95  JO/8.4ZY SOPN Inject 0.75 mg into the skin once a week.   glucose blood (ONETOUCH VERIO) test strip Use daily to check blood glucose   Lancets (ONETOUCH ULTRASOFT) lancets Use daily to check blood glucose   [DISCONTINUED] allopurinol (ZYLOPRIM) 100 MG tablet Take 1 tablet (100 mg total) by mouth daily.   [DISCONTINUED] atorvastatin (LIPITOR) 40 MG tablet TAKE 1 TABLET BY MOUTH ONCE DAILY AT  6PM   [DISCONTINUED] buPROPion (WELLBUTRIN XL) 150 MG 24 hr tablet Take 2 tablets (300 mg total) by mouth daily.   [DISCONTINUED] colchicine 0.6 MG tablet Take 1 tablet (0.6 mg total) by mouth daily prn gout attack.   [DISCONTINUED] dapagliflozin propanediol (FARXIGA) 10 MG TABS tablet Take 1 tablet (10 mg total) by mouth daily before breakfast.   [DISCONTINUED] furosemide (LASIX) 40 MG tablet Take 1 tablet (40 mg total) by mouth daily.   [DISCONTINUED] sildenafil (VIAGRA) 50 MG tablet Take 1 tablet (50 mg total) by mouth as needed for erectile dysfunction. May take 50-100 mg as needed   [DISCONTINUED] spironolactone (ALDACTONE) 25 MG tablet Take 1 tablet (25 mg total) by mouth daily.   [DISCONTINUED] varenicline (CHANTIX CONTINUING MONTH PAK) 1 MG tablet Take 1 tablet (1 mg total) by mouth 2 (two) times daily.   [DISCONTINUED] carvedilol (COREG) 12.5 MG tablet Take 1 tablet (12.5 mg total) by mouth 2 (two) times daily.   [DISCONTINUED] metFORMIN (GLUCOPHAGE) 500 MG tablet Take 2 tablets (1,000 mg total) by mouth 2 (two) times daily.  No facility-administered medications prior to visit.    Review of Systems    Objective    BP 138/88 (BP Location: Left Arm, Patient Position: Sitting, Cuff Size: Large)   Pulse 92   Resp 17   Wt 215 lb (97.5 kg)   SpO2 99%   BMI 29.99 kg/m   Physical Exam Vitals and nursing note reviewed.  Constitutional:      Appearance: Normal appearance. He is overweight.  HENT:     Head: Normocephalic and atraumatic.     Nose: Nose normal.     Mouth/Throat:     Dentition:  Dental caries present.     Tongue: Lesions present.   Eyes:     Pupils: Pupils are equal, round, and reactive to light.  Cardiovascular:     Rate and Rhythm: Normal rate and regular rhythm.     Pulses: Normal pulses.     Heart sounds: Normal heart sounds.  Pulmonary:     Effort: Pulmonary effort is normal.     Breath sounds: Normal breath sounds.  Musculoskeletal:        General: Normal range of motion.     Cervical back: Normal range of motion.  Skin:    General: Skin is warm and dry.     Capillary Refill: Capillary refill takes less than 2 seconds.  Neurological:     General: No focal deficit present.     Mental Status: He is alert and oriented to person, place, and time. Mental status is at baseline.      Results for orders placed or performed in visit on 10/06/22  POCT rapid strep A  Result Value Ref Range   Rapid Strep A Screen Negative Negative    Assessment & Plan     Problem List Items Addressed This Visit       Cardiovascular and Mediastinum   Chronic systolic heart failure ([place])    Chronic, stable Appears euvolemic today; however, weight is increased from previous OV in 05/2022 Request refill of previous medications Has an upcoming cardiology appt per pt report      Relevant Medications   sildenafil (VIAGRA) 100 MG tablet   atorvastatin (LIPITOR) 40 MG tablet   carvedilol (COREG) 12.5 MG tablet   furosemide (LASIX) 40 MG tablet   spironolactone (ALDACTONE) 25 MG tablet   sacubitril-valsartan (ENTRESTO) 97-103 MG   HFrEF (heart failure with reduced ejection fraction) (HCC)    Followed by cardiology      Relevant Medications   sildenafil (VIAGRA) 100 MG tablet   atorvastatin (LIPITOR) 40 MG tablet   carvedilol (COREG) 12.5 MG tablet   furosemide (LASIX) 40 MG tablet   spironolactone (ALDACTONE) 25 MG tablet   sacubitril-valsartan (ENTRESTO) 97-103 MG   Hypertension associated with diabetes (HCC)    Chronic, borderline Goal <130/<80 Resume home  medications which have run out d/t financial concerns      Relevant Medications   sildenafil (VIAGRA) 100 MG tablet   atorvastatin (LIPITOR) 40 MG tablet   carvedilol (COREG) 12.5 MG tablet   dapagliflozin propanediol (FARXIGA) 10 MG TABS tablet   furosemide (LASIX) 40 MG tablet   metFORMIN (GLUCOPHAGE-XR) 500 MG 24 hr tablet   spironolactone (ALDACTONE) 25 MG tablet   sacubitril-valsartan (ENTRESTO) 97-103 MG     Digestive   Gastroesophageal reflux disease with esophagitis without hemorrhage    Chronic, previously untreated Trial of PPI to assist Pt reports concern for up to daily vomiting for "years" Endorses that "vomit is coming up  internally and he has to get rid of it"      Relevant Medications   pantoprazole (PROTONIX) 40 MG tablet   Tongue coating - Primary    Acute, stable No concern for angioedema VSS Denies sick contacts Encouraged to change his toothbrush Trial of nystatin to assist Continue to recommend smoking cessation; pt remains pre-contemplative      Relevant Medications   nystatin (MYCOSTATIN) 100000 UNIT/ML suspension   Other Relevant Orders   POCT rapid strep A (Completed)     Endocrine   Type 2 diabetes mellitus with diabetic neuropathy, without long-term current use of insulin (HCC)    Chronic, previously uncontrolled Recommend repeat labs Has been taking metformin however, has been under a lot of stress in the past few months      Relevant Medications   atorvastatin (LIPITOR) 40 MG tablet   dapagliflozin propanediol (FARXIGA) 10 MG TABS tablet   metFORMIN (GLUCOPHAGE-XR) 500 MG 24 hr tablet     Musculoskeletal and Integument   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      Vonna Kotyk, FNP, have reviewed all  documentation for this visit. The documentation on 10/06/22 for the exam, diagnosis, procedures, and orders are all accurate and complete.    Gwyneth Sprout, [place] [phone] (phone) [phone] (fax)  [place]

## 2022-10-06 NOTE — Assessment & Plan Note (Signed)
Chronic, previously uncontrolled Recommend repeat labs Has been taking metformin however, has been under a lot of stress in the past few months

## 2022-10-06 NOTE — Assessment & Plan Note (Signed)
Chronic, stable Request for medication refill  

## 2022-10-06 NOTE — Assessment & Plan Note (Signed)
Followed by cardiology 

## 2022-10-06 NOTE — Assessment & Plan Note (Signed)
Chronic, borderline Goal <130/<80 Resume home medications which have run out d/t financial concerns

## 2022-10-06 NOTE — Assessment & Plan Note (Signed)
Chronic, previously untreated Trial of PPI to assist Pt reports concern for up to daily vomiting for "years" Endorses that "vomit is coming up internally and he has to get rid of it"

## 2022-10-07 ENCOUNTER — Other Ambulatory Visit: Payer: Self-pay | Admitting: Family Medicine

## 2022-10-19 ENCOUNTER — Encounter (INDEPENDENT_AMBULATORY_CARE_PROVIDER_SITE_OTHER): Payer: Managed Care, Other (non HMO) | Admitting: Nurse Practitioner

## 2022-10-19 ENCOUNTER — Encounter (INDEPENDENT_AMBULATORY_CARE_PROVIDER_SITE_OTHER): Payer: Managed Care, Other (non HMO)

## 2022-10-20 ENCOUNTER — Ambulatory Visit: Payer: Managed Care, Other (non HMO) | Admitting: Cardiovascular Disease

## 2022-10-21 ENCOUNTER — Telehealth: Payer: Self-pay

## 2022-10-21 NOTE — Telephone Encounter (Signed)
Copied from CRM [id number]. Topic: General - Call Back - No Documentation >> Oct 21, 2022  1:13 PM Ja-Kwan M wrote: Reason for CRM: Pt stated he was returning a missed call that he received. Cb# [phone]

## 2022-11-25 ENCOUNTER — Encounter: Payer: Self-pay | Admitting: Family Medicine

## 2022-11-26 ENCOUNTER — Other Ambulatory Visit: Payer: Self-pay | Admitting: Family Medicine

## 2022-11-30 ENCOUNTER — Ambulatory Visit: Payer: Managed Care, Other (non HMO) | Admitting: Cardiovascular Disease

## 2022-12-14 ENCOUNTER — Ambulatory Visit: Payer: Managed Care, Other (non HMO) | Admitting: Physician Assistant

## 2022-12-14 ENCOUNTER — Encounter: Payer: Self-pay | Admitting: Physician Assistant

## 2022-12-14 VITALS — BP 138/89 | HR 88 | Temp 98.5°F | Wt 212.9 lb

## 2022-12-14 DIAGNOSIS — E1140 Type 2 diabetes mellitus with diabetic neuropathy, unspecified: Secondary | ICD-10-CM

## 2022-12-14 DIAGNOSIS — E785 Hyperlipidemia, unspecified: Secondary | ICD-10-CM

## 2022-12-14 DIAGNOSIS — M65332 Trigger finger, left middle finger: Secondary | ICD-10-CM | POA: Insufficient documentation

## 2022-12-14 DIAGNOSIS — I5020 Unspecified systolic (congestive) heart failure: Secondary | ICD-10-CM

## 2022-12-14 DIAGNOSIS — E1159 Type 2 diabetes mellitus with other circulatory complications: Secondary | ICD-10-CM

## 2022-12-14 DIAGNOSIS — E1169 Type 2 diabetes mellitus with other specified complication: Secondary | ICD-10-CM

## 2022-12-14 DIAGNOSIS — I152 Hypertension secondary to endocrine disorders: Secondary | ICD-10-CM

## 2022-12-14 MED ORDER — TRULICITY 0.75 MG/0.5ML ~~LOC~~ SOAJ: 0.7500 mg | SUBCUTANEOUS | 3 refills | Status: DC

## 2022-12-14 MED ORDER — SACUBITRIL-VALSARTAN 97-103 MG PO TABS: 1.0000 | ORAL_TABLET | Freq: Two times a day (BID) | ORAL | 1 refills | Status: DC

## 2022-12-14 NOTE — Assessment & Plan Note (Signed)
Vs dupuytren's contracture, palpable thickening/nodule felt advised pt to massage/heat  If no improvement can ref to ortho

## 2022-12-14 NOTE — Progress Notes (Signed)
Established patient visit   Patient: Keith [name]   DOB: 20-Dec-1970   52 y.o. Male  MRN: [id number] Visit Date: 12/14/2022  Today's healthcare provider: Mikey Kirschner, PA-C   CC. Dm follow up   Subjective    HPI  Pt reports his left middle finger will get stuck towards his hand and he has to force it up which is painful. He will sometimes make a home-made split to keep his finger up.  Diabetes Mellitus Type II, Follow-up  Lab Results  Component Value Date   HGBA1C 8.9 (H) 06/04/2022   HGBA1C CANCELED 04/09/2022   HGBA1C 8.5 (H) 12/29/2021   Wt Readings from Last 3 Encounters:  12/14/22 212 lb 14.4 oz (96.6 kg)  10/06/22 215 lb (97.5 kg)  06/04/22 210 lb 14.4 oz (95.7 kg)   Pt should be taking: farxiga 10 mg, trulicity 9.41 mg, metformin 1000 mg BID. He is only compliant trulicity and mostly metformin. Reports farxiga is waiting for him at the pharmacy.   He reports poor compliance with treatment. He is not having side effects.  Symptoms: Yes fatigue No foot ulcerations  No appetite changes No nausea  Yes paresthesia of the feet  No polydipsia  No polyuria Yes visual disturbances   No vomiting     Home blood sugar records: not being checked  Episodes of hypoglycemia? Not being checked   Current insulin regiment: none Most Recent Eye Exam: over a year   Pertinent Labs: Lab Results  Component Value Date   CHOL 155 12/29/2021   HDL 32 (L) 12/29/2021   LDLCALC 80 12/29/2021   TRIG 263 (H) 12/29/2021   CHOLHDL 4.8 12/29/2021   Lab Results  Component Value Date   NA 136 06/04/2022   K 4.3 06/04/2022   CREATININE 0.80 06/04/2022   EGFR 107 06/04/2022   LABMICR 13.8 04/09/2022   MICRALBCREAT 15 04/09/2022     --------------------------------------------------------------------------------------------------- Hypertension, follow-up  BP Readings from Last 3 Encounters:  12/14/22 138/89  10/06/22 138/88  06/04/22 122/82   Wt Readings from  Last 3 Encounters:  12/14/22 212 lb 14.4 oz (96.6 kg)  10/06/22 215 lb (97.5 kg)  06/04/22 210 lb 14.4 oz (95.7 kg)     Pt should be following with cardiology and managing with: carvedilol 12.5 bid, lasix 40 mg daily, entresto bid, and spironolactone 25 mg. He is only compliant with entresto bid.  He reports poor compliance with treatment. He is not having side effects.  He is following a Regular diet. He is not exercising. He does smoke.  Use of agents associated with hypertension: none.   Outside blood pressures are not being checked. Symptoms: No chest pain No chest pressure  No palpitations No syncope  Yes dyspnea Yes orthopnea  No paroxysmal nocturnal dyspnea No lower extremity edema   Pertinent labs Lab Results  Component Value Date   CHOL 155 12/29/2021   HDL 32 (L) 12/29/2021   LDLCALC 80 12/29/2021   TRIG 263 (H) 12/29/2021   CHOLHDL 4.8 12/29/2021   Lab Results  Component Value Date   NA 136 06/04/2022   K 4.3 06/04/2022   CREATININE 0.80 06/04/2022   EGFR 107 06/04/2022   GLUCOSE 175 (H) 06/04/2022   TSH 2.960 03/24/2021     The 10-year ASCVD risk score (Arnett DK, et al., 2019) is: 23.1%  ---------------------------------------------------------------------------------------------------   Medications: Outpatient Medications Prior to Visit  Medication Sig   allopurinol (ZYLOPRIM) 100 MG tablet Take 1 tablet (  100 mg total) by mouth daily.   aspirin 81 MG chewable tablet Chew 1 tablet (81 mg total) by mouth daily.   atorvastatin (LIPITOR) 40 MG tablet TAKE 1 TABLET BY MOUTH ONCE DAILY AT  6PM   Blood Glucose Monitoring Suppl (ONETOUCH VERIO) w/Device KIT Use daily to check blood glucose   Budeson-Glycopyrrol-Formoterol (BREZTRI AEROSPHERE) 160-9-4.8 MCG/ACT AERO Inhale 2 puffs into the lungs 2 (two) times daily.   buPROPion (WELLBUTRIN XL) 150 MG 24 hr tablet Take 2 tablets (300 mg total) by mouth daily. (Patient not taking: Reported on 12/14/2022)    carvedilol (COREG) 12.5 MG tablet Take 1 tablet (12.5 mg total) by mouth 2 (two) times daily. (Patient not taking: Reported on 12/14/2022)   dapagliflozin propanediol (FARXIGA) 10 MG TABS tablet Take 1 tablet (10 mg total) by mouth daily before breakfast.   furosemide (LASIX) 40 MG tablet Take 1 tablet (40 mg total) by mouth daily.   glucose blood (ONETOUCH VERIO) test strip Use daily to check blood glucose   Lancets (ONETOUCH ULTRASOFT) lancets Use daily to check blood glucose   metFORMIN (GLUCOPHAGE-XR) 500 MG 24 hr tablet Take 2 tablets (1,000 mg total) by mouth 2 (two) times daily with a meal.   nystatin (MYCOSTATIN) 100000 UNIT/ML suspension Take 5 mLs (500,000 Units total) by mouth 4 (four) times daily.   pantoprazole (PROTONIX) 40 MG tablet Take 1 tablet (40 mg total) by mouth daily.   sildenafil (VIAGRA) 100 MG tablet Take 1 tablet (100 mg total) by mouth daily as needed for erectile dysfunction.   spironolactone (ALDACTONE) 25 MG tablet Take 1 tablet (25 mg total) by mouth daily.   varenicline (CHANTIX CONTINUING MONTH PAK) 1 MG tablet Take 1 tablet (1 mg total) by mouth 2 (two) times daily.   [DISCONTINUED] Dulaglutide (TRULICITY) 5.57 DU/2.0UR SOPN Inject 0.75 mg into the skin once a week.   [DISCONTINUED] sacubitril-valsartan (ENTRESTO) 97-103 MG Take 1 tablet by mouth 2 (two) times daily.   No facility-administered medications prior to visit.      Objective    Blood pressure 138/89, pulse 88, temperature 98.5 F (36.9 C), weight 212 lb 14.4 oz (96.6 kg), SpO2 96 %.   Physical Exam Constitutional:      General: He is awake.     Appearance: He is well-developed.  HENT:     Head: Normocephalic.  Eyes:     Conjunctiva/sclera: Conjunctivae normal.  Cardiovascular:     Rate and Rhythm: Normal rate and regular rhythm.     Pulses:          Dorsalis pedis pulses are 2+ on the right side and 2+ on the left side.       Posterior tibial pulses are 2+ on the right side and 2+ on the  left side.     Heart sounds: Normal heart sounds.  Pulmonary:     Effort: Pulmonary effort is normal.     Breath sounds: Decreased breath sounds present.  Feet:     Right foot:     Protective Sensation: 4 sites tested.  0 sites sensed.     Left foot:     Protective Sensation: 4 sites tested.  2 sites sensed.     Comments: Pt has dusky hyperpigmentation to b/l lower extremities, worse on the right ankle Skin:    General: Skin is warm.  Neurological:     Mental Status: He is alert and oriented to person, place, and time.  Psychiatric:  Attention and Perception: Attention normal.        Mood and Affect: Mood normal.        Speech: Speech normal.        Behavior: Behavior is cooperative.      No results found for any visits on 12/14/22.  Assessment & Plan     Problem List Items Addressed This Visit       Cardiovascular and Mediastinum   Hypertension associated with diabetes (HCC) - Primary    Managed with entresto 97-103 spironolactone 25  -- pt only compliant with entresto Pressure today in range. Ordered cmp . Encouraged compliance to medications and with cardiology        Relevant Medications   Dulaglutide (TRULICITY) 0.75 MG/0.5ML SOPN   sacubitril-valsartan (ENTRESTO) 97-103 MG   Other Relevant Orders   Comprehensive Metabolic Panel (CMET)   HFrEF (heart failure with reduced ejection fraction) (HCC)    No peripheral edema but significant cough and dyspnea. Pt has been out of lasix; encouraged pt to stay compliant and follow with cardiology      Relevant Medications   sacubitril-valsartan (ENTRESTO) 97-103 MG     Endocrine   Type 2 diabetes mellitus with diabetic neuropathy, without long-term current use of insulin (HCC)    Chronic and uncontrolled. Last A1c 8.9%. pt declines poc a1cs, ordered labs Managed with farxiga 10 trulicity 0.75 metformin 1000 XR Pt has a history of noncompliance . Stressed compliance with meds.  On statin Foot completed  today Uacr utd F/u 3-6 mo pending a1c       Relevant Medications   Dulaglutide (TRULICITY) 0.75 MG/0.5ML SOPN   Other Relevant Orders   Comprehensive Metabolic Panel (CMET)   HgB A1c   Hyperlipidemia associated with type 2 diabetes mellitus (HCC)    Managed on atorvastatin 40 mg  Repeat fasting lipids LDL goal < 70      Relevant Medications   Dulaglutide (TRULICITY) 0.75 MG/0.5ML SOPN   sacubitril-valsartan (ENTRESTO) 97-103 MG   Other Relevant Orders   Lipid Profile     Musculoskeletal and Integument   Trigger middle finger of left hand    Vs dupuytren's contracture, palpable thickening/nodule felt advised pt to massage/heat  If no improvement can ref to ortho       Return in about 4 months (around 04/14/2023) for DMII, hypertension.      I, Alfredia Ferguson, PA-C have reviewed all documentation for this visit. The documentation on  12/14/22 for the exam, diagnosis, procedures, and orders are all accurate and complete.  Alfredia Ferguson, PA-C [facility] [address] [place], [place], [address] Office: [phone] Fax: [phone]   [facility]

## 2022-12-14 NOTE — Assessment & Plan Note (Addendum)
Chronic and uncontrolled. Last A1c 8.9%. pt declines poc a1cs, ordered labs Managed with farxiga 10 trulicity 9.20 metformin 1000 XR Pt has a history of noncompliance . Stressed compliance with meds.  On statin Foot completed today Uacr utd F/u 3-6 mo pending a1c

## 2022-12-14 NOTE — Assessment & Plan Note (Addendum)
No peripheral edema but significant cough and dyspnea. Pt has been out of lasix; encouraged pt to stay compliant and follow with cardiology

## 2022-12-14 NOTE — Assessment & Plan Note (Addendum)
Managed with entresto 97-103 spironolactone 25  -- pt only compliant with entresto Pressure today in range. Ordered cmp . Encouraged compliance to medications and with cardiology

## 2022-12-14 NOTE — Assessment & Plan Note (Signed)
Managed on atorvastatin 40 mg  Repeat fasting lipids LDL goal < 70

## 2022-12-15 LAB — COMPREHENSIVE METABOLIC PANEL
ALT: 13 IU/L (ref 0–44)
AST: 12 IU/L (ref 0–40)
Albumin/Globulin Ratio: 1.9 (ref 1.2–2.2)
Albumin: 4.5 g/dL (ref 3.8–4.9)
Alkaline Phosphatase: 94 IU/L (ref 44–121)
BUN/Creatinine Ratio: 18 (ref 9–20)
BUN: 15 mg/dL (ref 6–24)
Bilirubin Total: 1 mg/dL (ref 0.0–1.2)
CO2: 22 mmol/L (ref 20–29)
Calcium: 9.6 mg/dL (ref 8.7–10.2)
Chloride: 99 mmol/L (ref 96–106)
Creatinine, Ser: 0.85 mg/dL (ref 0.76–1.27)
Globulin, Total: 2.4 g/dL (ref 1.5–4.5)
Glucose: 245 mg/dL — ABNORMAL HIGH (ref 70–99)
Potassium: 4.1 mmol/L (ref 3.5–5.2)
Sodium: 138 mmol/L (ref 134–144)
Total Protein: 6.9 g/dL (ref 6.0–8.5)
eGFR: 105 mL/min/{1.73_m2} (ref >59)

## 2022-12-15 LAB — LIPID PANEL
Chol/HDL Ratio: 5 ratio (ref 0.0–5.0)
Cholesterol, Total: 174 mg/dL (ref 100–199)
HDL: 35 mg/dL — ABNORMAL LOW (ref >39)
LDL Chol Calc (NIH): 103 mg/dL — ABNORMAL HIGH (ref 0–99)
Triglycerides: 206 mg/dL — ABNORMAL HIGH (ref 0–149)
VLDL Cholesterol Cal: 36 mg/dL (ref 5–40)

## 2022-12-15 LAB — HEMOGLOBIN A1C
Est. average glucose Bld gHb Est-mCnc: 243 mg/dL
Hgb A1c MFr Bld: 10.1 % — ABNORMAL HIGH (ref 4.8–5.6)

## 2023-01-06 ENCOUNTER — Other Ambulatory Visit (INDEPENDENT_AMBULATORY_CARE_PROVIDER_SITE_OTHER): Payer: Self-pay | Admitting: Vascular Surgery

## 2023-01-06 DIAGNOSIS — I739 Peripheral vascular disease, unspecified: Secondary | ICD-10-CM

## 2023-01-07 ENCOUNTER — Ambulatory Visit (INDEPENDENT_AMBULATORY_CARE_PROVIDER_SITE_OTHER): Payer: Managed Care, Other (non HMO) | Admitting: Vascular Surgery

## 2023-01-07 ENCOUNTER — Encounter (INDEPENDENT_AMBULATORY_CARE_PROVIDER_SITE_OTHER): Payer: Self-pay | Admitting: Vascular Surgery

## 2023-01-07 ENCOUNTER — Ambulatory Visit (INDEPENDENT_AMBULATORY_CARE_PROVIDER_SITE_OTHER): Payer: Managed Care, Other (non HMO)

## 2023-01-07 VITALS — BP 172/101 | HR 80 | Resp 16 | Wt 212.0 lb

## 2023-01-07 DIAGNOSIS — M79606 Pain in leg, unspecified: Secondary | ICD-10-CM | POA: Diagnosis not present

## 2023-01-07 DIAGNOSIS — I739 Peripheral vascular disease, unspecified: Secondary | ICD-10-CM

## 2023-01-07 DIAGNOSIS — I25118 Atherosclerotic heart disease of native coronary artery with other forms of angina pectoris: Secondary | ICD-10-CM

## 2023-01-07 DIAGNOSIS — I152 Hypertension secondary to endocrine disorders: Secondary | ICD-10-CM

## 2023-01-07 DIAGNOSIS — I872 Venous insufficiency (chronic) (peripheral): Secondary | ICD-10-CM | POA: Diagnosis not present

## 2023-01-07 DIAGNOSIS — E1159 Type 2 diabetes mellitus with other circulatory complications: Secondary | ICD-10-CM

## 2023-01-07 NOTE — Progress Notes (Signed)
MRN : [id number]  Keith [name] is a 52 y.o. (1971-05-25) male who presents with chief complaint of check circulation.  History of Present Illness:   The patient is seen for evaluation of painful lower extremities. Patient notes the pain is variable and not always associated with activity.  The pain is somewhat consistent day to day occurring on most days. The patient notes the pain also occurs with standing and routinely seems worse as the day wears on. The pain has been progressive over the past several years. The patient states these symptoms are causing  a negative impact on quality of life and daily activities which was a factor in the referral.  The patient does not have a documented history of back problems and DJD of the lumbar and sacral spine.   The patient denies rest pain or dangling of an extremity off the side of the bed during the night for relief. No open wounds or sores at this time. No history of DVT or phlebitis. No prior vascular interventions or surgeries.  ABI's are normal   No outpatient medications have been marked as taking for the 01/07/23 encounter (Appointment) with Delana Meyer, Dolores Lory, MD.    Past Medical History:  Diagnosis Date   Chronic combined systolic (congestive) and diastolic (congestive) heart failure ([place])    a. 06/2017 Echo: EF 35-40%, Gr1 DD; b. 02/2018 Echo: Ef 30-35%; c. 03/2018 Echo: EF 35-40%, mid-apicalanteroseptal, ant, and apical HK. Mildly dil LA. No LV thrombus.   Diabetes mellitus    Erectile dysfunction    Essential hypertension    Frequent headaches    NICM (nonischemic cardiomyopathy) ([place])    a. 06/2017 Echo: EF 35-40%, Gr1 DD; b. 06/2017 Cath: nonobs dzs; c. 02/2018 Echo: Ef 30-35%; d. 03/2018 Echo: EF 35-40%, mid-apicalanteroseptal, ant, and apical HK. Mildly dil LA. No LV thrombus.   Non-obstructive Coronary Artery Disease    a. 06/2017 Cath: LM nl, LAD nl, D1/2/3 nl, LCX 30p, OM1 nl, OM2 min irregs, OM3/4 nl,  RCA 30p/m, RPL2/3 nl.   TIA (transient ischemic attack)    a. 02/2018 MRA w/o acute findings; b. 02/2018 Carotis U/S: no hemodynamically significant stenoses.   Tobacco abuse     Past Surgical History:  Procedure Laterality Date   COLONOSCOPY WITH PROPOFOL N/A 02/19/2017   Procedure: COLONOSCOPY WITH PROPOFOL;  Surgeon: Jonathon Bellows, MD;  Location: [facility];  Service: Endoscopy;  Laterality: N/A;   ESOPHAGOGASTRODUODENOSCOPY (EGD) WITH PROPOFOL N/A 02/19/2017   Procedure: ESOPHAGOGASTRODUODENOSCOPY (EGD) WITH PROPOFOL;  Surgeon: Jonathon Bellows, MD;  Location: [facility]COPY;  Service: Endoscopy;  Laterality: N/A;   LEFT HEART CATH AND CORONARY ANGIOGRAPHY N/A 07/11/2017   Procedure: LEFT HEART CATH AND CORONARY ANGIOGRAPHY;  Surgeon: Wellington Hampshire, MD;  Location: [facility];  Service: Cardiovascular;  Laterality: N/A;    Social History Social History   Tobacco Use   Smoking status: Every Day    Packs/day: 1.00    Types: Cigarettes   Smokeless tobacco: Never  Vaping Use   Vaping Use: Never used  Substance Use Topics   Alcohol use: Yes    Comment: Occasionally    Drug use: No    Family History Family History  Problem Relation Age of Onset   Hypertension Mother    Diabetes Mother    Hypertension Father    Diabetes Father     Allergies  Allergen Reactions  Other Other (See Comments)    Muscle Relaxers (caused psychotic break) Other reaction(s): Other (See Comments) Muscle Relaxers (caused psychotic break)     REVIEW OF SYSTEMS (Negative unless checked)  Constitutional: '[]'$ Weight loss  '[]'$ Fever  '[]'$ Chills Cardiac: '[]'$ Chest pain   '[]'$ Chest pressure   '[]'$ Palpitations   '[]'$ Shortness of breath when laying flat   '[]'$ Shortness of breath with exertion. Vascular:  '[x]'$ Pain in legs with walking   '[]'$ Pain in legs at rest  '[]'$ History of DVT   '[]'$ Phlebitis   '[]'$ Swelling in legs   '[]'$ Varicose veins   '[]'$ Non-healing ulcers Pulmonary:   '[]'$ Uses home oxygen   '[]'$ Productive cough    '[]'$ Hemoptysis   '[]'$ Wheeze  '[]'$ COPD   '[]'$ Asthma Neurologic:  '[]'$ Dizziness   '[]'$ Seizures   '[]'$ History of stroke   '[]'$ History of TIA  '[]'$ Aphasia   '[]'$ Vissual changes   '[]'$ Weakness or numbness in arm   '[]'$ Weakness or numbness in leg Musculoskeletal:   '[]'$ Joint swelling   '[]'$ Joint pain   '[]'$ Low back pain Hematologic:  '[]'$ Easy bruising  '[]'$ Easy bleeding   '[]'$ Hypercoagulable state   '[]'$ Anemic Gastrointestinal:  '[]'$ Diarrhea   '[]'$ Vomiting  '[]'$ Gastroesophageal reflux/heartburn   '[]'$ Difficulty swallowing. Genitourinary:  '[]'$ Chronic kidney disease   '[]'$ Difficult urination  '[]'$ Frequent urination   '[]'$ Blood in urine Skin:  '[x]'$ Rashes   '[]'$ Ulcers  Psychological:  '[]'$ History of anxiety   '[]'$  History of major depression.  Physical Examination  There were no vitals filed for this visit. There is no height or weight on file to calculate BMI. Gen: WD/WN, NAD Head: [place]/AT, No temporalis wasting.  Ear/Nose/Throat: Hearing grossly intact, nares w/o erythema or drainage Eyes: PER, EOMI, sclera nonicteric.  Neck: Supple, no masses.  No bruit or JVD.  Pulmonary:  Good air movement, no audible wheezing, no use of accessory muscles.  Cardiac: RRR, normal S1, S2, no Murmurs. Vascular: No trophic changes, no open wounds Moderate venous stasis changes to the legs bilaterally.  2+ soft pitting edema. CEAP [id number]   Vessel Right Left  Radial Palpable Palpable  PT  Palpable  Palpable  DP  Palpable Palpable  Gastrointestinal: soft, non-distended. No guarding/no peritoneal signs.  Musculoskeletal: M/S 5/5 throughout.  No visible deformity.  Neurologic: CN 2-12 intact. Pain and light touch intact in extremities.  Symmetrical.  Speech is fluent. Motor exam as listed above. Psychiatric: Judgment intact, Mood & affect appropriate for pt's clinical situation. Dermatologic: No rashes or ulcers noted.  No changes consistent with cellulitis.   CBC Lab Results  Component Value Date   WBC 7.4 06/04/2022   HGB 16.5 06/04/2022   HCT 46.7 06/04/2022   MCV  85 06/04/2022   PLT 199 06/04/2022    BMET    Component Value Date/Time   NA 138 12/14/2022 1018   K 4.1 12/14/2022 1018   CL 99 12/14/2022 1018   CO2 22 12/14/2022 1018   GLUCOSE 245 (H) 12/14/2022 1018   GLUCOSE 154 (H) 03/18/2021 1946   BUN 15 12/14/2022 1018   CREATININE 0.85 12/14/2022 1018   CREATININE 0.72 12/14/2016 0834   CALCIUM 9.6 12/14/2022 1018   GFRNONAA >60 03/18/2021 1946   GFRNONAA >89 12/14/2016 0834   GFRAA 93 10/25/2020 1009   GFRAA >89 12/14/2016 0834   CrCl cannot be calculated (Patient's most recent lab result is older than the maximum 21 days allowed.).  COAG Lab Results  Component Value Date   INR 0.94 03/02/2018   INR 0.92 07/11/2017   INR 0.99 01/23/2015    Radiology No results found.   Assessment/Plan  1. Pain of lower extremity, unspecified laterality Recommend:  No surgery or intervention at this point in time.  I have reviewed my discussion with the patient regarding venous insufficiency and why it causes symptoms. I have discussed with the patient the chronic skin changes that accompany venous insufficiency and the long term sequela such as ulceration. Patient will contnue wearing graduated compression stockings on a daily basis, as this has provided excellent control of his edema. The patient will put the stockings on first thing in the morning and removing them in the evening. The patient is reminded not to sleep in the stockings.  In addition, behavioral modification including elevation during the day will be initiated. Exercise is strongly encouraged.  Previous duplex ultrasound of the lower extremities shows normal deep system, no significant superficial reflux was identified.  Given the patient's good control and lack of any problems regarding the venous insufficiency and lymphedema a lymph pump in not need at this time.    The patient will follow up with me PRN should anything change.  The patient voices agreement with this  plan.  2. PAD (peripheral artery disease) (HCC) Recommend:  I do not find evidence of life style limiting vascular disease. The patient specifically denies life style limitation.  Previous noninvasive studies including ABI's of the legs do not identify critical vascular problems.  The patient should continue walking and begin a more formal exercise program. The patient should continue his antiplatelet therapy and aggressive treatment of the lipid abnormalities.  The patient is instructed to call the office if there is a significant change in the lower extremity symptoms, particularly if a wound develops or there is an abrupt increase in leg pain.  Patient will follow-up with me on a PRN basis  3. Chronic venous insufficiency Recommend:  No surgery or intervention at this point in time.  I have reviewed my discussion with the patient regarding venous insufficiency and why it causes symptoms. I have discussed with the patient the chronic skin changes that accompany venous insufficiency and the long term sequela such as ulceration. Patient will contnue wearing graduated compression stockings on a daily basis, as this has provided excellent control of his edema. The patient will put the stockings on first thing in the morning and removing them in the evening. The patient is reminded not to sleep in the stockings.  In addition, behavioral modification including elevation during the day will be initiated. Exercise is strongly encouraged.  Previous duplex ultrasound of the lower extremities shows normal deep system, no significant superficial reflux was identified.  Given the patient's good control and lack of any problems regarding the venous insufficiency and lymphedema a lymph pump in not need at this time.    The patient will follow up with me PRN should anything change.  The patient voices agreement with this plan.  4. Coronary artery disease of native artery of native heart with stable  angina pectoris (HCC) Continue cardiac and antihypertensive medications as already ordered and reviewed, no changes at this time.  Continue statin as ordered and reviewed, no changes at this time  Nitrates PRN for chest pain  5. Hypertension associated with diabetes ([place]) Continue antihypertensive medications as already ordered, these medications have been reviewed and there are no changes at this time.    Hortencia Pilar, MD  01/07/2023 9:41 AM

## 2023-01-08 ENCOUNTER — Encounter: Payer: Self-pay | Admitting: Physician Assistant

## 2023-01-08 LAB — VAS US ABI WITH/WO TBI
Left ABI: 1.24
Right ABI: 1.23

## 2023-01-09 ENCOUNTER — Encounter (INDEPENDENT_AMBULATORY_CARE_PROVIDER_SITE_OTHER): Payer: Self-pay | Admitting: Vascular Surgery

## 2023-01-11 ENCOUNTER — Other Ambulatory Visit: Payer: Self-pay

## 2023-01-11 MED ORDER — BREZTRI AEROSPHERE 160-9-4.8 MCG/ACT IN AERO: 2.0000 | INHALATION_SPRAY | Freq: Two times a day (BID) | RESPIRATORY_TRACT | 11 refills | Status: DC

## 2023-02-01 ENCOUNTER — Ambulatory Visit: Payer: Managed Care, Other (non HMO) | Admitting: Cardiovascular Disease

## 2023-02-26 ENCOUNTER — Telehealth: Payer: Self-pay | Admitting: Physician Assistant

## 2023-03-01 ENCOUNTER — Ambulatory Visit: Payer: Managed Care, Other (non HMO) | Admitting: Medical

## 2023-03-08 ENCOUNTER — Ambulatory Visit: Payer: Managed Care, Other (non HMO) | Admitting: Physician Assistant

## 2023-03-15 ENCOUNTER — Ambulatory Visit: Payer: Managed Care, Other (non HMO) | Admitting: Medical

## 2023-03-23 ENCOUNTER — Encounter: Payer: Self-pay | Admitting: Physician Assistant

## 2023-03-24 NOTE — Telephone Encounter (Signed)
Good afternoon, usually provider will evaluate your ears first, depending if needed treatment or flush to clean the wax out. Recommend to make an appointment [phone].

## 2023-04-26 ENCOUNTER — Encounter: Payer: Self-pay | Admitting: Medical

## 2023-04-26 ENCOUNTER — Ambulatory Visit: Payer: Managed Care, Other (non HMO) | Attending: Cardiovascular Disease | Admitting: Medical

## 2023-04-26 VITALS — BP 142/88 | HR 78 | Ht 71.0 in | Wt 205.2 lb

## 2023-04-26 DIAGNOSIS — I471 Supraventricular tachycardia, unspecified: Secondary | ICD-10-CM | POA: Diagnosis not present

## 2023-04-26 DIAGNOSIS — R011 Cardiac murmur, unspecified: Secondary | ICD-10-CM | POA: Diagnosis not present

## 2023-04-26 DIAGNOSIS — I5020 Unspecified systolic (congestive) heart failure: Secondary | ICD-10-CM | POA: Diagnosis not present

## 2023-04-26 DIAGNOSIS — E782 Mixed hyperlipidemia: Secondary | ICD-10-CM

## 2023-04-26 DIAGNOSIS — I10 Essential (primary) hypertension: Secondary | ICD-10-CM

## 2023-04-26 DIAGNOSIS — I2510 Atherosclerotic heart disease of native coronary artery without angina pectoris: Secondary | ICD-10-CM

## 2023-04-26 MED ORDER — SILDENAFIL CITRATE 100 MG PO TABS: 100.0000 mg | ORAL_TABLET | Freq: Every day | ORAL | 0 refills | Status: DC | PRN

## 2023-04-26 NOTE — Patient Instructions (Signed)
Medication Instructions:  Continue taking current medications as prescribed.   *If you need a refill on your cardiac medications before your next appointment, please call your pharmacy*   Lab Work: none If you have labs (blood work) drawn today and your tests are completely normal, you will receive your results only by: MyChart Message (if you have MyChart) OR A paper copy in the mail If you have any lab test that is abnormal or we need to change your treatment, we will call you to review the results.   Testing/Procedures: Your physician has requested that you have an echocardiogram. Echocardiography is a painless test that uses sound waves to create images of your heart. It provides your doctor with information about the size and shape of your heart and how well your heart's chambers and valves are working.   You may receive an ultrasound enhancing agent through an IV if needed to better visualize your heart during the echo. This procedure takes approximately one hour.  There are no restrictions for this procedure.  This will take place at [address] ([facility] [address], [place] [address]    Follow-Up: Your physician recommends that you schedule a follow-up appointment with EP for SVT  At [facility], you and your health needs are our priority.  As part of our continuing mission to provide you with exceptional heart care, we have created designated Provider Care Teams.  These Care Teams include your primary Cardiologist (physician) and Advanced Practice Providers (APPs -  Physician Assistants and Nurse Practitioners) who all work together to provide you with the care you need, when you need it.  We recommend signing up for the patient portal called "MyChart".  Sign up information is provided on this After Visit Summary.  MyChart is used to connect with patients for Virtual Visits (Telemedicine).  Patients are able to view lab/test results, encounter notes,  upcoming appointments, etc.  Non-urgent messages can be sent to your provider as well.   To learn more about what you can do with MyChart, go to [email or web address].    Your next appointment:   4 month(s)  Provider:   You may see Julien Nordmann, MD or one of the following Advanced Practice Providers on your designated Care Team:   Nicolasa Ducking, NP Eula Listen, PA-C Cadence Fransico Michael, PA-C Charlsie Quest, NP

## 2023-04-26 NOTE — Progress Notes (Signed)
Cardiology Office Note:    Date:  04/26/2023   ID:  Keith [name], DOB September 08, 1971, MRN [id number]  PCP:  Alfredia Ferguson, PA-C  [facility] Cardiologist:  Julien Nordmann, MD  [facility] Electrophysiologist:  None   Referring MD: Alfredia Ferguson, PA-C   Chief Complaint: 1 year follow-up  History of Present Illness:    Keith [name] is a 52 y.o. male with a hx of tobacco use, dilated CM, secondary to hypertensive disease, chronic systolic CHF, CAD, CKD stage 3 followed by nephrology, DM2 who presents for 1 year follow-up.    Patient had LHC 06/2017 with proximal circumflex 30%, pRCA 30%, mid to distal RCA, 30% with moderate to severe LV function. EF down to 30-35%. In the setting of mild nonobstructive disease, cardiomyopathy felt to be due to hypertensive heart disease. Echo 03/2018 showed LVEF 35-40%, He has been on losartan, spironolactone, lasix, potassium. He was transitioned to [facility] 09/2019.    Patient was seen 2022 reporting presyncope and palpitations.  2-week heart monitor and an echo were ordered.  Heart monitor showed normal sinus rhythm, bundle branch block, IVCD, 1547 runs of SVT, longest lasting 7 minutes 54 seconds.  SVT was detected within 45 seconds of symptomatic event.  Echo showed LVEF 40 to 45%, mild LVH.  Patient was last seen June 2023 and was overall stable from a cardiac perspective.  Today, the patient reports elevated BP last week at work. It read 200/?101. He felt lightheaded and went home. He had been out of his BP meds for 3 weeks. He has since re-started his BP medications. He denies chest pain or shortness of breath. He denies lower leg edema, orthopnea or pnd. He reports intermittent palpitations, lightheadedness/dizziness.  Most recent A1c 10.1.  Past Medical History:  Diagnosis Date   Chronic combined systolic (congestive) and diastolic (congestive) heart failure (HCC)    a. 06/2017 Echo: EF 35-40%, Gr1 DD; b. 02/2018 Echo: Ef 30-35%;  c. 03/2018 Echo: EF 35-40%, mid-apicalanteroseptal, ant, and apical HK. Mildly dil LA. No LV thrombus.   Diabetes mellitus    Erectile dysfunction    Essential hypertension    Frequent headaches    NICM (nonischemic cardiomyopathy) (HCC)    a. 06/2017 Echo: EF 35-40%, Gr1 DD; b. 06/2017 Cath: nonobs dzs; c. 02/2018 Echo: Ef 30-35%; d. 03/2018 Echo: EF 35-40%, mid-apicalanteroseptal, ant, and apical HK. Mildly dil LA. No LV thrombus.   Non-obstructive Coronary Artery Disease    a. 06/2017 Cath: LM nl, LAD nl, D1/2/3 nl, LCX 30p, OM1 nl, OM2 min irregs, OM3/4 nl, RCA 30p/m, RPL2/3 nl.   TIA (transient ischemic attack)    a. 02/2018 MRA w/o acute findings; b. 02/2018 Carotis U/S: no hemodynamically significant stenoses.   Tobacco abuse     Past Surgical History:  Procedure Laterality Date   COLONOSCOPY WITH PROPOFOL N/A 02/19/2017   Procedure: COLONOSCOPY WITH PROPOFOL;  Surgeon: [name], MD;  Location: [facility] ENDOSCOPY;  Service: Endoscopy;  Laterality: N/A;   ESOPHAGOGASTRODUODENOSCOPY (EGD) WITH PROPOFOL N/A 02/19/2017   Procedure: ESOPHAGOGASTRODUODENOSCOPY (EGD) WITH PROPOFOL;  Surgeon: [name], MD;  Location: [facility] ENDOSCOPY;  Service: Endoscopy;  Laterality: N/A;   LEFT HEART CATH AND CORONARY ANGIOGRAPHY N/A 07/11/2017   Procedure: LEFT HEART CATH AND CORONARY ANGIOGRAPHY;  Surgeon: [name], MD;  Location: [facility] INVASIVE CV LAB;  Service: Cardiovascular;  Laterality: N/A;    Current Medications: Current Meds  Medication Sig   allopurinol (ZYLOPRIM) 100 MG tablet Take 1 tablet (100 mg  total) by mouth daily.   atorvastatin (LIPITOR) 40 MG tablet TAKE 1 TABLET BY MOUTH ONCE DAILY AT  6PM   Budeson-Glycopyrrol-Formoterol (BREZTRI AEROSPHERE) 160-9-4.8 MCG/ACT AERO Inhale 2 puffs into the lungs 2 (two) times daily.   carvedilol (COREG) 12.5 MG tablet Take 1 tablet (12.5 mg total) by mouth 2 (two) times daily.   Dulaglutide (TRULICITY) 0.75 MG/0.5ML SOPN Inject 0.75 mg into the skin once  a week.   furosemide (LASIX) 40 MG tablet Take 1 tablet (40 mg total) by mouth daily.   metFORMIN (GLUCOPHAGE-XR) 500 MG 24 hr tablet Take 2 tablets (1,000 mg total) by mouth 2 (two) times daily with a meal.   sacubitril-valsartan (ENTRESTO) 97-103 MG Take 1 tablet by mouth 2 (two) times daily.   spironolactone (ALDACTONE) 25 MG tablet Take 1 tablet (25 mg total) by mouth daily.   [DISCONTINUED] sildenafil (VIAGRA) 100 MG tablet Take 1 tablet (100 mg total) by mouth daily as needed for erectile dysfunction.     Allergies:   Other   Social History   Socioeconomic History   Marital status: Married    Spouse name: Not on file   Number of children: Not on file   Years of education: Not on file   Highest education level: Not on file  Occupational History   Not on file  Tobacco Use   Smoking status: Every Day    Packs/day: 1    Types: Cigarettes   Smokeless tobacco: Never  Vaping Use   Vaping Use: Never used  Substance and Sexual Activity   Alcohol use: Yes    Comment: Occasionally    Drug use: No   Sexual activity: Yes  Other Topics Concern   Not on file  Social History Narrative   Not on file   Social Determinants of Health   Financial Resource Strain: Not on file  Food Insecurity: Not on file  Transportation Needs: Not on file  Physical Activity: Not on file  Stress: Not on file  Social Connections: Not on file     Family History: The patient's family history includes Diabetes in his father and mother; Hypertension in his father and mother.  ROS:   Please see the history of present illness.     All other systems reviewed and are negative.  EKGs/Labs/Other Studies Reviewed:    The following studies were reviewed today:  Echo 2022 1. Left ventricular ejection fraction, by estimation, is 40 to 45%. The  left ventricle has mildly decreased function. The left ventricle  demonstrates global hypokinesis. Regional wall motional abnormalities  cannot be excluded.  There is mild left  ventricular hypertrophy. Left ventricular diastolic parameters are  indeterminate. The average left ventricular global longitudinal strain is  -10.4 %. The global longitudinal strain is abnormal.   2. Right ventricular systolic function is normal. The right ventricular  size is normal.   3. The mitral valve is grossly normal. No evidence of mitral valve  regurgitation. No evidence of mitral stenosis.   4. The aortic valve has an indeterminant number of cusps. Aortic valve  regurgitation is not visualized. No aortic stenosis is present.   5. There is borderline dilatation of the ascending aorta, measuring 36  mm.   6. The inferior vena cava is normal in size with greater than 50%  respiratory variability, suggesting right atrial pressure of 3 mmHg.    Heart monitor 04/2021 Event Monitor Patch Wear Time:  13 days and 6 hours (2022-05-13T15:06:20-0400 to 2022-05-26T21:34:53-0400)   Normal  sinus rhythm Patient had a min HR of 53 bpm, max HR of 200 bpm, and avg HR of 87 bpm.  Bundle Branch Block/IVCD was present. QRS morphology changes were present throughout recording.    1547 Supraventricular Tachycardia/atrtial tachycardia runs occurred, the run with the fastest interval lasting 14 beats with a max rate of 200 bpm, the longest lasting 7 mins 54 secs with an avg rate of 118 bpm.  Supraventricular Tachycardia was detected within +/- 45 seconds of symptomatic patient event(s).    Isolated SVEs were rare (<1.0%), SVE Couplets were rare (<1.0%), and SVE Triplets were rare (<1.0%).  Isolated VEs were rare (<1.0%), and no VE Couplets or VE Triplets were present.   Signed, Dossie Arbour, MD, Ph.D [facility]  Echo 03/2018 Study Conclusions   - Procedure narrative: LIMITED TTE to assess for LV apical thrombus    and LVF.  - Left ventricle: The cavity size was normal. Systolic function was    moderately reduced. The estimated ejection fraction was in the    range of 35%  to 40%. Hypokinesis of the mid-apicalanteroseptal,    anterior, and apical myocardium.  - Left atrium: The atrium was mildly dilated.   Impressions:   - No LV thrombus.    Echo 02/2018 Left ventricle: The cavity size was mildly dilated. Systolic    function was moderately to severely reduced. The estimated    ejection fraction was in the range of 30% to 35%. Hypokinesis of    the anterior myocardium. Hypokinesis of the anteroseptal    myocardium. Hypokinesis of the apical myocardium.  - Mitral valve: There was mild regurgitation.  - Left atrium: The atrium was mildly dilated.  - Right ventricle: Systolic function was normal.  - Pulmonary arteries: Systolic pressure was mild to moderately    elevated PA peak pressure: 44 mm Hg (S).   Impressions:   - Unable to exclude apical thrombus. Severe apical hypokinesis, if    not akinesis. Consider imaging of apical region with definity if    clinically indicated.    Echo 06/2107 Procedure narrative: Transthoracic echocardiography. Image    quality was poor. The study was technically difficult, as a    result of poor acoustic windows and poor sound wave transmission.    Intravenous contrast (Definity) was administered.  - Left ventricle: The cavity size was mildly dilated. There was    mild concentric hypertrophy. Systolic function was moderately    reduced. The estimated ejection fraction was in the range of 35%    to 40%. Diffuse hypokinesis. Features are consistent with a    pseudonormal left ventricular filling pattern, with concomitant    abnormal relaxation and increased filling pressure (grade 2    diastolic dysfunction).  - Left atrium: The atrium was mildly dilated.    LHC 06/2017 Prox Cx lesion, 30 %stenosed. Prox RCA lesion, 30 %stenosed. Mid RCA to Dist RCA lesion, 30 %stenosed. There is moderate to severe left ventricular systolic dysfunction. LV end diastolic pressure is moderately elevated. There is trivial (1+) mitral  regurgitation.   1. Mild nonobstructive coronary artery disease. 2. Moderately to severely reduced LV systolic function with an EF of 30-35% likely due to hypertensive heart disease. 3. Moderately elevated left ventricular end-diastolic pressure. 4. Aortic root angiography showed no evidence of ascending aortic aneurysm or dissection.   Recommendations: I suspect that the patient's symptoms are likely due to acute systolic heart failure due to hypertensive heart disease with new left bundle branch block.  There is no culprit for unstable angina. We need to start him on heart failure medications. I initiated treatment with carvedilol and Entresto (the patient tolerated losartan in the past). Gentle diuresis with oral furosemide. Smoking cessation is advised.     EKG:  EKG is  ordered today.  The ekg ordered today demonstrates NSR, 78bpm, LAD, LBBB, no changes  Recent Labs: 06/04/2022: Hemoglobin 16.5; Platelets 199 12/14/2022: ALT 13; BUN 15; Creatinine, Ser 0.85; Potassium 4.1; Sodium 138  Recent Lipid Panel    Component Value Date/Time   CHOL 174 12/14/2022 1018   TRIG 206 (H) 12/14/2022 1018   HDL 35 (L) 12/14/2022 1018   CHOLHDL 5.0 12/14/2022 1018   CHOLHDL 4.3 03/03/2018 0610   VLDL 33 03/03/2018 0610   LDLCALC 103 (H) 12/14/2022 1018     Physical Exam:    VS:  BP (!) 142/88 (BP Location: Left Arm, Patient Position: Sitting, Cuff Size: Normal)   Pulse 78   Ht 5\' 11"  (1.803 m)   Wt 205 lb 3.2 oz (93.1 kg)   SpO2 96%   BMI 28.62 kg/m     Wt Readings from Last 3 Encounters:  04/26/23 205 lb 3.2 oz (93.1 kg)  01/07/23 212 lb (96.2 kg)  12/14/22 212 lb 14.4 oz (96.6 kg)     GEN:  Well nourished, well developed in no acute distress HEENT: Normal NECK: No JVD; No carotid bruits LYMPHATICS: No lymphadenopathy CARDIAC: RRR, + murmur, no rubs, gallops RESPIRATORY:  Clear to auscultation without rales, wheezing or rhonchi  ABDOMEN: Soft, non-tender,  non-distended MUSCULOSKELETAL:  No edema; No deformity  SKIN: Warm and dry NEUROLOGIC:  Alert and oriented x 3 PSYCHIATRIC:  Normal affect   ASSESSMENT:    1. Paroxysmal SVT (supraventricular tachycardia)   2. HFrEF (heart failure with reduced ejection fraction) (HCC)   3. Murmur   4. Essential hypertension   5. Coronary artery disease involving native coronary artery of native heart without angina pectoris   6. Hyperlipidemia, mixed    PLAN:    In order of problems listed above:  SVT Heart monitor in 2022 for pre-syncope/palpitations showed frequent SVT up to 7 minutes. He reports palpitations once a month. I will refer to EP for further recommendations. Continue BB therapy.  NICM Possibly hypertensive cardiomyopathy. Most recent echo in 2022 showed improved EF 40-45%.  Patient is euvolemic on exam.  Patient recently restarted Coreg 12.5 mg twice daily, Farxiga 10 mg daily, Entresto 97-103 mg twice daily, and spironolactone 25 mg daily.  He takes Lasix 40 mg daily for volume maintenance.  Murmur I will re-order an echo.  HTN He reports elevated BP last week after being off his meds for 3 weeks. He has since restarted Entresto, spironolactone and Coreg.   Nonobstructive CAD Heart cath in 2018 showed nonobstructive CAD.  Patient denies chest pain or shortness of breath.  Continue aspirin, beta-blocker and statin therapy.  HLD LDL 103. Continue Atorvastatin 40mg  daily.   Disposition: Follow up in 4 month(s) with MD/APP    Signed, Charie Pinkus David Stall, PA-C  04/26/2023 9:59 AM    [facility]

## 2023-05-03 ENCOUNTER — Encounter: Payer: Self-pay | Admitting: Family Medicine

## 2023-05-03 ENCOUNTER — Ambulatory Visit: Payer: Managed Care, Other (non HMO) | Attending: Medical

## 2023-05-03 ENCOUNTER — Encounter: Payer: Self-pay | Admitting: Cardiology

## 2023-05-03 DIAGNOSIS — R011 Cardiac murmur, unspecified: Secondary | ICD-10-CM

## 2023-05-03 DIAGNOSIS — I517 Cardiomegaly: Secondary | ICD-10-CM | POA: Diagnosis not present

## 2023-05-03 DIAGNOSIS — I5030 Unspecified diastolic (congestive) heart failure: Secondary | ICD-10-CM

## 2023-05-03 LAB — ECHOCARDIOGRAM COMPLETE
Area-P 1/2: 3.48 cm2
S' Lateral: 4.4 cm

## 2023-05-10 ENCOUNTER — Other Ambulatory Visit: Payer: Self-pay | Admitting: Family Medicine

## 2023-05-10 DIAGNOSIS — K143 Hypertrophy of tongue papillae: Secondary | ICD-10-CM

## 2023-05-11 MED ORDER — NYSTATIN 100000 UNIT/ML MT SUSP: 5.0000 mL | Freq: Four times a day (QID) | OROMUCOSAL | 0 refills | Status: DC

## 2023-06-01 ENCOUNTER — Emergency Department: Payer: Managed Care, Other (non HMO)

## 2023-06-01 ENCOUNTER — Encounter: Payer: Self-pay | Admitting: Emergency Medicine

## 2023-06-01 ENCOUNTER — Other Ambulatory Visit: Payer: Self-pay

## 2023-06-01 ENCOUNTER — Emergency Department
Admission: EM | Admit: 2023-06-01 | Discharge: 2023-06-01 | Disposition: A | Discharge status: Home / Self Care | Payer: Managed Care, Other (non HMO) | Attending: Emergency Medicine | Admitting: Emergency Medicine

## 2023-06-01 DIAGNOSIS — I509 Heart failure, unspecified: Secondary | ICD-10-CM | POA: Insufficient documentation

## 2023-06-01 DIAGNOSIS — E119 Type 2 diabetes mellitus without complications: Secondary | ICD-10-CM | POA: Diagnosis not present

## 2023-06-01 DIAGNOSIS — I110 Hypertensive heart disease with heart failure: Secondary | ICD-10-CM | POA: Insufficient documentation

## 2023-06-01 DIAGNOSIS — R55 Syncope and collapse: Secondary | ICD-10-CM | POA: Diagnosis not present

## 2023-06-01 DIAGNOSIS — S59902A Unspecified injury of left elbow, initial encounter: Secondary | ICD-10-CM | POA: Insufficient documentation

## 2023-06-01 DIAGNOSIS — I2510 Atherosclerotic heart disease of native coronary artery without angina pectoris: Secondary | ICD-10-CM | POA: Diagnosis not present

## 2023-06-01 DIAGNOSIS — W06XXXA Fall from bed, initial encounter: Secondary | ICD-10-CM | POA: Diagnosis not present

## 2023-06-01 DIAGNOSIS — M25522 Pain in left elbow: Secondary | ICD-10-CM

## 2023-06-01 NOTE — ED Notes (Signed)
See triage notes. Patient stated that he passed out last night and hit his elbow.

## 2023-06-01 NOTE — ED Provider Notes (Signed)
[facility] Provider Note    Event Date/Time   First MD Initiated Contact with Patient 06/01/23 [phone]     (approximate)   History   Arm Injury   HPI  Keith [name] is a 52 y.o. male with history of hypertension, type 2 diabetes, TIA, CAD, chronic venous insufficiency, hyperlipidemia, gout, heart failure with reduced ejection fraction and as listed in EMR presents to the emergency department for treatment and evaluation of left-sided arm pain after he fell while getting out of bed this morning. Patient states he has a history of syncope upon standing which is what occurred this morning. He landed with his left arm out and now has pain in the elbow when he attempts to straighten it. He denies upper or lower extremity weakness, headache, dizziness, or other new concerns at this time.  Physical Exam   Triage Vital Signs: ED Triage Vitals [06/01/23 0628]  Encounter Vitals Group     BP (!) 146/90     Systolic BP Percentile      Diastolic BP Percentile      Pulse Rate 71     Resp 19     Temp 98.4 F (36.9 C)     Temp Source Oral     SpO2 97 %     Weight 200 lb (90.7 kg)     Height 5\' 11"  (1.803 m)     Head Circumference      Peak Flow      Pain Score 6     Pain Loc      Pain Education      Exclude from Growth Chart     Most recent vital signs: Vitals:   06/01/23 0628  BP: (!) 146/90  Pulse: 71  Resp: 19  Temp: 98.4 F (36.9 C)  SpO2: 97%    General: Awake, no distress.  CV:  Good peripheral perfusion.  Resp:  Normal effort.  Abd:  No distention.  Other:  Pain with attempt to fully extend at left elbow. FROM of left shoulder, wrist, and fingers.   ED Results / Procedures / Treatments   Labs (all labs ordered are listed, but only abnormal results are displayed) Labs Reviewed - No data to display   EKG   RADIOLOGY  Image and radiology report reviewed and interpreted by me. Radiology report consistent with the same.  No  acute bony abnormality of the left elbow.  PROCEDURES:  Critical Care performed: No  Procedures   MEDICATIONS ORDERED IN ED:  Medications - No data to display   IMPRESSION / MDM / ASSESSMENT AND PLAN / ED COURSE   I have reviewed the triage note.  Differential diagnosis includes, but is not limited to, radial head fracture, proximal radius and ulna fracture, traumatic bursitis  Patient's presentation is most consistent with acute complicated illness / injury requiring diagnostic workup.  52 year old male presenting to the emergency department for treatment and evaluation after sustaining a fall this morning while getting out of bed.  See HPI for further details.  Patient states that this is not uncommon and he has different physicians working to try and figure out why this happens.  We discussed getting CT, EKG, and lab studies however he declines. He is working with cardiology and PCP to determine cause.   X-ray of the left elbow is without definite bony abnormality. His symptoms are consistent with radial head fracture and will be treated with sling for immobilization for a few days. He  is to follow up with orthopedics or PCP in about a week for repeat imaging if symptoms are not improving. ER return precautions regarding syncope discussed and he agrees to return for any concerns.     FINAL CLINICAL IMPRESSION(S) / ED DIAGNOSES   Final diagnoses:  Elbow pain, left  Syncope, unspecified syncope type     Rx / DC Orders   ED Discharge Orders     None        Note:  This document was prepared using Dragon voice recognition software and may include unintentional dictation errors.   Chinita Pester, FNP 06/01/23 1338    Jene Every, MD 06/01/23 [phone]

## 2023-06-01 NOTE — Discharge Instructions (Signed)
Wear the sling for the next 3 days.  Schedule a follow up with orthopedics.  If you develop any new symptoms of concern--chest pain, palpitations,  shortness of breath, weakness, etc... return to the ER.

## 2023-06-01 NOTE — ED Triage Notes (Signed)
Pt c/o left sided arm pain with decreased ambulation following a fall.

## 2023-06-23 ENCOUNTER — Institutional Professional Consult (permissible substitution): Payer: Managed Care, Other (non HMO) | Admitting: Cardiology

## 2023-07-09 ENCOUNTER — Encounter: Payer: Self-pay | Admitting: Family Medicine

## 2023-07-09 ENCOUNTER — Ambulatory Visit: Payer: Managed Care, Other (non HMO) | Admitting: Family Medicine

## 2023-07-09 VITALS — BP 129/79 | HR 74 | Temp 98.3°F | Resp 12 | Ht 71.0 in | Wt 205.0 lb

## 2023-07-09 DIAGNOSIS — Z125 Encounter for screening for malignant neoplasm of prostate: Secondary | ICD-10-CM | POA: Insufficient documentation

## 2023-07-09 DIAGNOSIS — F17200 Nicotine dependence, unspecified, uncomplicated: Secondary | ICD-10-CM | POA: Diagnosis not present

## 2023-07-09 DIAGNOSIS — I5020 Unspecified systolic (congestive) heart failure: Secondary | ICD-10-CM

## 2023-07-09 DIAGNOSIS — E1159 Type 2 diabetes mellitus with other circulatory complications: Secondary | ICD-10-CM

## 2023-07-09 DIAGNOSIS — E1169 Type 2 diabetes mellitus with other specified complication: Secondary | ICD-10-CM

## 2023-07-09 DIAGNOSIS — I872 Venous insufficiency (chronic) (peripheral): Secondary | ICD-10-CM

## 2023-07-09 DIAGNOSIS — I739 Peripheral vascular disease, unspecified: Secondary | ICD-10-CM | POA: Diagnosis not present

## 2023-07-09 DIAGNOSIS — Z7984 Long term (current) use of oral hypoglycemic drugs: Secondary | ICD-10-CM

## 2023-07-09 DIAGNOSIS — I152 Hypertension secondary to endocrine disorders: Secondary | ICD-10-CM

## 2023-07-09 DIAGNOSIS — E0840 Diabetes mellitus due to underlying condition with diabetic neuropathy, unspecified: Secondary | ICD-10-CM

## 2023-07-09 DIAGNOSIS — E785 Hyperlipidemia, unspecified: Secondary | ICD-10-CM

## 2023-07-09 NOTE — Progress Notes (Signed)
Established patient visit   Patient: Keith [name]   DOB: 05-14-1971   52 y.o. Male  MRN: [id number] Visit Date: 07/09/2023  Today's healthcare provider: [name], FNP  Introduced to nurse practitioner role and practice setting.  All questions answered.  Discussed provider/patient relationship and expectations.  Subjective    HPI HPI   Patient C/O neuropathy and toenail is painful. Last edited by Myles Lipps, CMA on 07/09/2023  4:05 PM.      Medications: Outpatient Medications Prior to Visit  Medication Sig   Budeson-Glycopyrrol-Formoterol (BREZTRI AEROSPHERE) 160-9-4.8 MCG/ACT AERO Inhale 2 puffs into the lungs 2 (two) times daily.   Dulaglutide (TRULICITY) 0.75 MG/0.5ML SOPN Inject 0.75 mg into the skin once a week.   metFORMIN (GLUCOPHAGE-XR) 500 MG 24 hr tablet Take 2 tablets (1,000 mg total) by mouth 2 (two) times daily with a meal.   sacubitril-valsartan (ENTRESTO) 97-103 MG Take 1 tablet by mouth 2 (two) times daily.   sildenafil (VIAGRA) 100 MG tablet Take 1 tablet (100 mg total) by mouth daily as needed for erectile dysfunction.   spironolactone (ALDACTONE) 25 MG tablet Take 1 tablet (25 mg total) by mouth daily.   [DISCONTINUED] atorvastatin (LIPITOR) 40 MG tablet TAKE 1 TABLET BY MOUTH ONCE DAILY AT  6PM   [DISCONTINUED] carvedilol (COREG) 12.5 MG tablet Take 1 tablet (12.5 mg total) by mouth 2 (two) times daily.   [DISCONTINUED] furosemide (LASIX) 40 MG tablet Take 1 tablet (40 mg total) by mouth daily.   [DISCONTINUED] allopurinol (ZYLOPRIM) 100 MG tablet Take 1 tablet (100 mg total) by mouth daily.   [DISCONTINUED] aspirin 81 MG chewable tablet Chew 1 tablet (81 mg total) by mouth daily. (Patient not taking: Reported on 04/26/2023)   [DISCONTINUED] Blood Glucose Monitoring Suppl (ONETOUCH VERIO) w/Device KIT Use daily to check blood glucose (Patient not taking: Reported on 04/26/2023)   [DISCONTINUED] buPROPion (WELLBUTRIN XL) 150 MG 24 hr tablet  Take 2 tablets (300 mg total) by mouth daily.   [DISCONTINUED] dapagliflozin propanediol (FARXIGA) 10 MG TABS tablet Take 1 tablet (10 mg total) by mouth daily before breakfast.   [DISCONTINUED] glucose blood (ONETOUCH VERIO) test strip Use daily to check blood glucose (Patient not taking: Reported on 04/26/2023)   [DISCONTINUED] Lancets (ONETOUCH ULTRASOFT) lancets Use daily to check blood glucose (Patient not taking: Reported on 04/26/2023)   [DISCONTINUED] nystatin (MYCOSTATIN) 100000 UNIT/ML suspension Take 5 mLs (500,000 Units total) by mouth 4 (four) times daily.   [DISCONTINUED] pantoprazole (PROTONIX) 40 MG tablet Take 1 tablet (40 mg total) by mouth daily. (Patient not taking: Reported on 04/26/2023)   [DISCONTINUED] varenicline (CHANTIX CONTINUING MONTH PAK) 1 MG tablet Take 1 tablet (1 mg total) by mouth 2 (two) times daily. (Patient not taking: Reported on 04/26/2023)   No facility-administered medications prior to visit.     Objective    BP 129/79 (BP Location: Left Arm, Patient Position: Sitting, Cuff Size: Large)   Pulse 74   Temp 98.3 F (36.8 C) (Temporal)   Resp 12   Ht 5\' 11"  (1.803 m)   Wt 205 lb (93 kg)   SpO2 96%   BMI 28.59 kg/m   Physical Exam Vitals and nursing note reviewed.  Constitutional:      Appearance: Normal appearance. He is overweight.  HENT:     Head: Normocephalic and atraumatic.  Eyes:     Pupils: Pupils are equal, round, and reactive to light.  Cardiovascular:     Rate and Rhythm:  Normal rate and regular rhythm.     Pulses:          Dorsalis pedis pulses are 1+ on the right side and 1+ on the left side.     Heart sounds: Normal heart sounds.  Pulmonary:     Effort: Pulmonary effort is normal.     Breath sounds: Normal breath sounds.  Musculoskeletal:     Cervical back: Normal range of motion.     Right foot: Decreased range of motion.     Left foot: Decreased range of motion.  Feet:     Right foot:     Protective Sensation: 10 sites  tested.  10 sites sensed.     Skin integrity: Erythema and callus present.     Toenail Condition: Right toenails are ingrown. Fungal disease present.    Left foot:     Protective Sensation: 10 sites tested.  10 sites sensed.     Skin integrity: Erythema and callus present.     Toenail Condition: Fungal disease present. Skin:    General: Skin is warm and dry.     Capillary Refill: Capillary refill takes less than 2 seconds.  Neurological:     General: No focal deficit present.     Mental Status: He is alert and oriented to person, place, and time. Mental status is at baseline.     No results found for any visits on 07/09/23.  Assessment & Plan     Problem List Items Addressed This Visit       Cardiovascular and Mediastinum   HFrEF (heart failure with reduced ejection fraction) (HCC)    Chronic, uncontrolled Pt with medication adherence issues      Hypertension associated with diabetes (HCC)    Chronic, remains elevated Goal of 119/79 Repeat labs      PAD (peripheral artery disease) (HCC) - Primary    Chronic, worsening Referral to vascular and repeat imaging to assist       Relevant Orders   Ambulatory referral to Vascular Surgery   CBC with Differential/Platelet   Comprehensive Metabolic Panel (CMET)   TSH   Lipid panel   Hemoglobin A1c   Urine Microalbumin w/creat. ratio   US Venous Img Lower Bilateral   Venous insufficiency of both lower extremities    Chronic, exacerbated L>R Referral to vascular Discussed chronic changed and need for tobacco cessation and medication adherence to assist      Relevant Orders   Ambulatory referral to Vascular Surgery   CBC with Differential/Platelet   Comprehensive Metabolic Panel (CMET)   TSH   Lipid panel   Hemoglobin A1c   Urine Microalbumin w/creat. ratio   US Venous Img Lower Bilateral     Endocrine   Diabetes mellitus due to underlying condition with diabetic neuropathy, without long-term current use of insulin  (HCC)    Chronic, not controlled A1c goal <7% Repeat A1c and urine micro Pt reports he will schedule his DM eye exam      Relevant Orders   Ambulatory referral to Vascular Surgery   CBC with Differential/Platelet   Comprehensive Metabolic Panel (CMET)   TSH   Lipid panel   Hemoglobin A1c   Urine Microalbumin w/creat. ratio   US Venous Img Lower Bilateral   Hyperlipidemia associated with type 2 diabetes mellitus (HCC)    Chronic, non adherent to medication LDL goal <70 Repeat LP        Other   Screening PSA (prostate specific antigen)    Denies  LUTS; recommend PSA in place of DRE. If PSA is elevated for age, we will repeat; if PSA remains elevated pt will be referred to urology for DRE and next steps for best treatment.       Relevant Orders   PSA   Tobacco dependence    Chronic, stable Declines cessation efforts at this time        No follow-ups on file.      Leilani Merl, FNP, have reviewed all documentation for this visit. The documentation on 07/09/23 for the exam, diagnosis, procedures, and orders are all accurate and complete.  [name], FNP  [facility] [phone] (phone) [phone] (fax)  [facility]

## 2023-07-09 NOTE — Assessment & Plan Note (Signed)
Chronic, stable Declines cessation efforts at this time

## 2023-07-09 NOTE — Assessment & Plan Note (Signed)
Chronic, worsening Referral to vascular and repeat imaging to assist

## 2023-07-09 NOTE — Assessment & Plan Note (Signed)
Chronic, non adherent to medication LDL goal <70 Repeat LP

## 2023-07-09 NOTE — Assessment & Plan Note (Signed)
Denies LUTS; recommend PSA in place of DRE. If PSA is elevated for age, we will repeat; if PSA remains elevated pt will be referred to urology for DRE and next steps for best treatment.  

## 2023-07-09 NOTE — Assessment & Plan Note (Signed)
Chronic, uncontrolled Pt with medication adherence issues

## 2023-07-09 NOTE — Assessment & Plan Note (Signed)
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

## 2023-07-09 NOTE — Assessment & Plan Note (Signed)
Chronic, not controlled A1c goal <7% Repeat A1c and urine micro Pt reports he will schedule his DM eye exam

## 2023-07-09 NOTE — Assessment & Plan Note (Signed)
Chronic, remains elevated Goal of 119/79 Repeat labs

## 2023-07-12 LAB — COMPREHENSIVE METABOLIC PANEL
ALT: 13 IU/L (ref 0–44)
AST: 14 IU/L (ref 0–40)
Albumin: 4.7 g/dL (ref 3.8–4.9)
Alkaline Phosphatase: 105 IU/L (ref 44–121)
BUN/Creatinine Ratio: 13 (ref 9–20)
BUN: 11 mg/dL (ref 6–24)
Bilirubin Total: 1.1 mg/dL (ref 0.0–1.2)
CO2: 22 mmol/L (ref 20–29)
Calcium: 9.6 mg/dL (ref 8.7–10.2)
Chloride: 98 mmol/L (ref 96–106)
Creatinine, Ser: 0.87 mg/dL (ref 0.76–1.27)
Globulin, Total: 2.5 g/dL (ref 1.5–4.5)
Glucose: 190 mg/dL — ABNORMAL HIGH (ref 70–99)
Potassium: 4.3 mmol/L (ref 3.5–5.2)
Sodium: 136 mmol/L (ref 134–144)
Total Protein: 7.2 g/dL (ref 6.0–8.5)
eGFR: 104 mL/min/{1.73_m2} (ref >59)

## 2023-07-12 LAB — CBC WITH DIFFERENTIAL/PLATELET
Basophils Absolute: 0 10*3/uL (ref 0.0–0.2)
Basos: 0 %
EOS (ABSOLUTE): 0.2 10*3/uL (ref 0.0–0.4)
Eos: 2 %
Hematocrit: 46.4 % (ref 37.5–51.0)
Hemoglobin: 16.3 g/dL (ref 13.0–17.7)
Immature Grans (Abs): 0 10*3/uL (ref 0.0–0.1)
Immature Granulocytes: 0 %
Lymphocytes Absolute: 1.9 10*3/uL (ref 0.7–3.1)
Lymphs: 25 %
MCH: 30.5 pg (ref 26.6–33.0)
MCHC: 35.1 g/dL (ref 31.5–35.7)
MCV: 87 fL (ref 79–97)
Monocytes Absolute: 0.4 10*3/uL (ref 0.1–0.9)
Monocytes: 5 %
Neutrophils Absolute: 4.9 10*3/uL (ref 1.4–7.0)
Neutrophils: 68 %
Platelets: 209 10*3/uL (ref 150–450)
RBC: 5.35 x10E6/uL (ref 4.14–5.80)
RDW: 12.3 % (ref 11.6–15.4)
WBC: 7.4 10*3/uL (ref 3.4–10.8)

## 2023-07-12 LAB — LIPID PANEL
Chol/HDL Ratio: 4.1 ratio (ref 0.0–5.0)
Cholesterol, Total: 141 mg/dL (ref 100–199)
HDL: 34 mg/dL — ABNORMAL LOW (ref >39)
LDL Chol Calc (NIH): 63 mg/dL (ref 0–99)
Triglycerides: 272 mg/dL — ABNORMAL HIGH (ref 0–149)
VLDL Cholesterol Cal: 44 mg/dL — ABNORMAL HIGH (ref 5–40)

## 2023-07-12 LAB — MICROALBUMIN / CREATININE URINE RATIO
Creatinine, Urine: 117.2 mg/dL
Microalb/Creat Ratio: 18 mg/g{creat} (ref 0–29)
Microalbumin, Urine: 20.8 ug/mL

## 2023-07-12 LAB — PSA: Prostate Specific Ag, Serum: 0.5 ng/mL (ref 0.0–4.0)

## 2023-07-12 LAB — HEMOGLOBIN A1C
Est. average glucose Bld gHb Est-mCnc: 324 mg/dL
Hgb A1c MFr Bld: 12.9 % — ABNORMAL HIGH (ref 4.8–5.6)

## 2023-07-12 LAB — TSH: TSH: 2.71 u[IU]/mL (ref 0.450–4.500)

## 2023-07-13 ENCOUNTER — Other Ambulatory Visit: Payer: Self-pay | Admitting: Family Medicine

## 2023-07-13 ENCOUNTER — Encounter: Payer: Self-pay | Admitting: Family Medicine

## 2023-07-13 MED ORDER — DAPAGLIFLOZIN PROPANEDIOL 10 MG PO TABS: 10.0000 mg | ORAL_TABLET | Freq: Every day | ORAL | 11 refills | Status: DC

## 2023-07-13 MED ORDER — ALLOPURINOL 100 MG PO TABS: 100.0000 mg | ORAL_TABLET | Freq: Every day | ORAL | 11 refills | Status: DC

## 2023-07-13 MED ORDER — ASPIRIN 81 MG PO TBEC: 81.0000 mg | DELAYED_RELEASE_TABLET | Freq: Every day | ORAL | 11 refills | Status: DC

## 2023-07-13 MED ORDER — FREESTYLE LIBRE 3 READER DEVI: 1.0000 | 0 refills | Status: DC

## 2023-07-13 MED ORDER — TIRZEPATIDE 2.5 MG/0.5ML ~~LOC~~ SOAJ: 2.5000 mg | SUBCUTANEOUS | 0 refills | Status: DC

## 2023-07-13 MED ORDER — ROSUVASTATIN CALCIUM 20 MG PO TABS: 20.0000 mg | ORAL_TABLET | Freq: Every day | ORAL | 11 refills | Status: DC

## 2023-07-13 MED ORDER — PEN NEEDLES 32G X 5 MM MISC: 1.0000 | Freq: Every evening | 4 refills | Status: DC

## 2023-07-13 MED ORDER — METFORMIN HCL ER 500 MG PO TB24: 1000.0000 mg | ORAL_TABLET | Freq: Two times a day (BID) | ORAL | 11 refills | Status: DC

## 2023-07-13 MED ORDER — PANTOPRAZOLE SODIUM 20 MG PO TBEC: 20.0000 mg | DELAYED_RELEASE_TABLET | Freq: Every day | ORAL | 11 refills | Status: AC

## 2023-07-13 MED ORDER — FUROSEMIDE 40 MG PO TABS: 40.0000 mg | ORAL_TABLET | Freq: Every day | ORAL | 11 refills | Status: DC

## 2023-07-13 MED ORDER — SACUBITRIL-VALSARTAN 97-103 MG PO TABS: 1.0000 | ORAL_TABLET | Freq: Two times a day (BID) | ORAL | 11 refills | Status: DC

## 2023-07-13 MED ORDER — INSULIN DEGLUDEC FLEXTOUCH 100 UNIT/ML ~~LOC~~ SOPN: 10.0000 [IU] | PEN_INJECTOR | Freq: Every evening | SUBCUTANEOUS | 11 refills | Status: DC

## 2023-07-13 MED ORDER — SPIRONOLACTONE 25 MG PO TABS: 25.0000 mg | ORAL_TABLET | Freq: Every day | ORAL | 11 refills | Status: AC

## 2023-07-13 MED ORDER — FREESTYLE LIBRE 3 SENSOR MISC: 1.0000 | 11 refills | Status: DC

## 2023-07-13 NOTE — Progress Notes (Signed)
A1c is uncontrolled; worsening, recommend starting insulin to assist. Recommend consult with pharmacy to assist with medication compliance and pill burden.

## 2023-07-15 ENCOUNTER — Ambulatory Visit: Payer: Managed Care, Other (non HMO)

## 2023-07-16 ENCOUNTER — Encounter: Payer: Self-pay | Admitting: Pharmacist

## 2023-07-16 ENCOUNTER — Other Ambulatory Visit: Payer: Managed Care, Other (non HMO) | Admitting: Pharmacist

## 2023-07-16 NOTE — Progress Notes (Signed)
07/16/2023 Name: Keith [name] MRN: [id number] DOB: [birth year]  Chief Complaint  Patient presents with   Medication Management   Medication Assistance   Medication Adherence    Keith [name] is a 52 y.o. year old male who presented for a telephone visit.   They were referred to the pharmacist by their PCP for assistance in managing diabetes and medication access. Receive referral message from PCP through forwarded lab result note from 07/09/2023.  Patient unable to review medication today. Agreeable to talk briefly, but conversation limited as patient not home.  Subjective:  Care Team: Primary Care Provider: [name], FNP Cardiologist: Antonieta Iba, MD   Medication Access/Adherence  Current Pharmacy:  [facility] [address] [place] - [address] [place] [address] Phone: [phone] Fax: [phone]   Patient reports affordability concerns with their medications: Yes  Patient reports access/transportation concerns to their pharmacy: No  Patient reports adherence concerns with their medications:  No    Reports uses weekly pillbox to organize his medications. Admits to sometime missing a dose, but reports improved adherence recently  From review of chart, note patient and PCP have discussed switching him from Trulicity to [facility] 2.5 mg weekly, but patient was advised to follow up with pharmacy regarding approval through his insurance/cost - Follow up with [facility] today on behalf of patient and find Keith [name] is covered through his plan for $35 copayment  Diabetes:  Current medications:  - metformin ER 500 mg - 2 tablets twice daily - Trulicity 0.75 mg weekly on Mondays Plans to switch to instead take Mounjaro 2.5 mg weekly, as discussed with PCP, starting next Monday - Reports has not yet started Farxiga 10 mg daily, but planning to pick this up from pharmacy today and start taking - Reports has not  started [place] insulin.    Denies currently monitoring home blood sugar. Reports unable to check using glucometer due to fingerstick check  Patient interested in starting Freestyle Libre 3 continuous glucose monitor as ordered by PCP - Note patient has smartphone to use in place of reader device  Statin therapy: rosuvastatin 20 mg daily  Current medication access support: none   Objective:  Lab Results  Component Value Date   HGBA1C 12.9 (H) 07/09/2023    Lab Results  Component Value Date   CREATININE 0.87 07/09/2023   BUN 11 07/09/2023   NA 136 07/09/2023   K 4.3 07/09/2023   CL 98 07/09/2023   CO2 22 07/09/2023    Lab Results  Component Value Date   CHOL 141 07/09/2023   HDL 34 (L) 07/09/2023   LDLCALC 63 07/09/2023   TRIG 272 (H) 07/09/2023   CHOLHDL 4.1 07/09/2023   Outpatient Encounter Medications as of 07/16/2023  Medication Sig   allopurinol (ZYLOPRIM) 100 MG tablet Take 1 tablet (100 mg total) by mouth daily.   aspirin EC 81 MG tablet Take 1 tablet (81 mg total) by mouth daily. Swallow whole.   Budeson-Glycopyrrol-Formoterol (BREZTRI AEROSPHERE) 160-9-4.8 MCG/ACT AERO Inhale 2 puffs into the lungs 2 (two) times daily.   Continuous Glucose Receiver (FREESTYLE LIBRE 3 READER) DEVI 1 each by Does not apply route as directed.   Continuous Glucose Sensor (FREESTYLE LIBRE 3 SENSOR) MISC 1 each by Does not apply route every 14 (fourteen) days. Place 1 sensor on the skin every 14 days. Use to check glucose continuously   dapagliflozin propanediol (FARXIGA) 10 MG TABS tablet Take 1 tablet (10 mg  total) by mouth daily before breakfast.   furosemide (LASIX) 40 MG tablet Take 1 tablet (40 mg total) by mouth daily.   Insulin Degludec FlexTouch 100 UNIT/ML SOPN Inject 10 Units into the skin at bedtime. Titrate morning fasting blood sugar to goal of 100-130. Increase by 2 units every 3 days to meet this goal.   Insulin Pen Needle (PEN NEEDLES) 32G X 5 MM MISC 1 each by Does  not apply route at bedtime.   metFORMIN (GLUCOPHAGE-XR) 500 MG 24 hr tablet Take 2 tablets (1,000 mg total) by mouth 2 (two) times daily with a meal.   pantoprazole (PROTONIX) 20 MG tablet Take 1 tablet (20 mg total) by mouth daily.   rosuvastatin (CRESTOR) 20 MG tablet Take 1 tablet (20 mg total) by mouth daily.   sacubitril-valsartan (ENTRESTO) 97-103 MG Take 1 tablet by mouth 2 (two) times daily.   sildenafil (VIAGRA) 100 MG tablet Take 1 tablet (100 mg total) by mouth daily as needed for erectile dysfunction.   spironolactone (ALDACTONE) 25 MG tablet Take 1 tablet (25 mg total) by mouth daily.   tirzepatide [facility]) 2.5 MG/0.5ML Pen Inject 2.5 mg into the skin once a week.   No facility-administered encounter medications on file as of 07/16/2023.     Assessment/Plan:   Patient unable to review medication today. Conversation limited as patient not home.  Diabetes: - Currently uncontrolled - Reviewed long term cardiovascular and renal outcomes of uncontrolled blood sugar - Discuss importance of medication adherence. Encourage patient to continue to use weekly pillbox - Provide counseling on importance of home blood sugar monitoring and recommend patient pick up and start using Freestyle Libre 3 sensors for continuous glucose monitor, particularly as reports unable to use glucometer due to fingerstick checks. Note patient has smartphone and plans to use in place of reader device.  Provide patient with counseling, link to guide/setup videos via MyChart and contact number for MyFreestyle Program Patient to let pharmacy, office or Clinical Pharmacist know if has questions - Also send patient links for manufacturer websites for copay savings cards for Keith [name] and Keith [name], as requested - Recommend to start using Freestyle Libre 3 CGM to monitor blood sugar/as feedback on dietary choices and to contact office if needed for readings outside of established parameters or symptoms - Will  collaborate with PCP   Follow Up Plan:   Patient unable to schedule follow up with me today. States does not know when he will be available. Requests call back to schedule - Will collaborate with Care Guide Penne Lash to request assistance with outreach to patient for scheduling follow up appointment  Estelle Grumbles, PharmD, [facility] [phone]

## 2023-07-16 NOTE — Patient Instructions (Signed)
Goals Addressed             This Visit's Progress    Pharmacy Goals       The goal A1c is less than 7%. This is the best way to reduce the risk of the long term complications of diabetes, including heart disease, kidney disease, eye disease, strokes, and nerve damage. An A1c of less than 7% corresponds with fasting sugars less than 130 and 2 hour after meal sugars less than 180. Please start using Freestyle Libre Continuous Glucose Monitor to check your blood sugar.  Freestyle Libre 3 Continuous Glucose Monitoring:   Please copy and paste the following web address into Training and development officer for the [employer] 3 Videos:   https://www.freestyle.abbott/us-en/how-to-set-up.html   Also, you can reach out to the MyFreeStyle Support at [phone]. Their customer support is available 7 days a week 8 am to 8 pm.     Copay Savings Cards:   Please copy and paste the following web address into your web browser for the copay savings cards that we discussed:   Mounjaro:   https://mounjaro.lilly.com/savings-resources#savings     Farxiga:   WatchCalls.[email or web address]     Evaristo Bury:   https://www.novocare.com/diabetes/products/tresiba/savings-offer.html    Our goal bad cholesterol, or LDL, is less than 70 . This is why it is important to continue taking your rosuvastatin    To schedule a follow up appointment with me, you can reach out to [employer] at [phone]  Estelle Grumbles, PharmD, [facility] [phone]

## 2023-07-18 ENCOUNTER — Encounter: Payer: Self-pay | Admitting: Family Medicine

## 2023-07-20 ENCOUNTER — Telehealth: Payer: Self-pay

## 2023-07-20 NOTE — Progress Notes (Signed)
Care Guide Note  07/20/2023 Name: Keith [name] MRN: [id number] DOB: 1971/09/17  Referred by: [name], FNP Reason for referral : Care Coordination (Outreach to schedule with Pharm d )   Keith [name] is a 52 y.o. year old male who is a primary care patient of [name], FNP. Keith [name] was referred to the pharmacist for assistance related to HTN, HLD, and DM.    An unsuccessful telephone outreach was attempted today to contact the patient who was referred to the pharmacy team for assistance with medication management. Additional attempts will be made to contact the patient.   Penne Lash, RMA Care Guide [facility]  [place], [place] [address] Direct Dial: [phone] [email or web address]

## 2023-07-26 ENCOUNTER — Encounter: Payer: Self-pay | Admitting: Family Medicine

## 2023-07-26 ENCOUNTER — Ambulatory Visit: Payer: Managed Care, Other (non HMO) | Admitting: Family Medicine

## 2023-07-26 VITALS — BP 123/82 | HR 74 | Wt 203.0 lb

## 2023-07-26 DIAGNOSIS — F321 Major depressive disorder, single episode, moderate: Secondary | ICD-10-CM | POA: Insufficient documentation

## 2023-07-26 DIAGNOSIS — R45851 Suicidal ideations: Secondary | ICD-10-CM | POA: Diagnosis not present

## 2023-07-26 DIAGNOSIS — F411 Generalized anxiety disorder: Secondary | ICD-10-CM | POA: Diagnosis not present

## 2023-07-26 DIAGNOSIS — H6993 Unspecified Eustachian tube disorder, bilateral: Secondary | ICD-10-CM | POA: Diagnosis not present

## 2023-07-26 MED ORDER — FLUTICASONE PROPIONATE 50 MCG/ACT NA SUSP: 2.0000 | Freq: Every day | NASAL | 6 refills | Status: DC

## 2023-07-26 MED ORDER — SERTRALINE HCL 50 MG PO TABS: 50.0000 mg | ORAL_TABLET | Freq: Every day | ORAL | 3 refills | Status: DC

## 2023-07-26 NOTE — Assessment & Plan Note (Signed)
Chronic, has been happening for a couple of years  Use Flonase BID for a month  If symptoms do not improve, referral to ENT for evaluation of hearing loss

## 2023-07-26 NOTE — Progress Notes (Signed)
Established Patient Office Visit  Subjective   Patient ID: Keith [name], male    DOB: [birth year]  Age: 52 y.o. MRN: [id number]  Chief Complaint  Patient presents with   Ear Fullness   Depression    Keith [name] is here today to discuss ear pressure and increased depression / anxiety.  He has been having trouble hearing over the last couple of years. It has gotten worse over the past couple of months. He endorses a feeling of increased pressure with popping. He denies ear pain or discharge.  He has been having worsening anxiety and depression since his moms passing about a year and a half ago. He endorses suicidal ideation and worrying about every little thing. He denies increased stress, problems with sleep, or lack of interest in doing things. 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   Review of Systems  HENT:  Positive for hearing loss. Negative for congestion, ear discharge, ear pain and tinnitus.   Eyes:  Negative for blurred vision and double vision.  Psychiatric/Behavioral:  Positive for depression and suicidal ideas. The patient is nervous/anxious. The patient does not have insomnia.        07/26/2023   11:08 AM 07/09/2023    4:06 PM  GAD 7 : Generalized Anxiety Score  Nervous, Anxious, on Edge 1 0  Control/stop worrying 1 3  Worry too much - different things 3 3  Trouble relaxing 1 3  Restless 0 1  Easily annoyed or irritable 2 0  Afraid - awful might happen 0 0  Total GAD 7 Score 8 10  Anxiety Difficulty Not difficult at all Not difficult at all        07/26/2023   11:08 AM 07/09/2023    4:06 PM 10/06/2022    4:30 PM 06/04/2022    9:57 AM 04/09/2022    3:44 PM  Depression screen PHQ 2/9  Decreased Interest 0 0 0 0 0  Down, Depressed, Hopeless 1 3 1  0 0  PHQ - 2 Score 1 3 1  0 0  Altered sleeping 1 0 1 0 1  Tired, decreased energy 0 3 0 0 0  Change in appetite 1 0 0 0 0  Feeling bad or failure about yourself  1 0  0 0 0  Trouble concentrating 0 0 0 0 0  Moving slowly or fidgety/restless 0 0 0 0 0  Suicidal thoughts 1 0 0 0 0  PHQ-9 Score 5 6 2  0 1  Difficult doing work/chores Somewhat difficult Not difficult at all Not difficult at all Not difficult at all Not difficult at all      Objective:     BP 123/82 (BP Location: Right Arm, Patient Position: Sitting, Cuff Size: Normal)   Pulse 74   Wt 203 lb (92.1 kg)   BMI 28.31 kg/m    Physical Exam Constitutional:      Appearance: He is obese.  HENT:     Head: Normocephalic and atraumatic.     Right Ear: Tympanic membrane, ear canal and external ear normal.     Left Ear: Tympanic membrane, ear canal and external ear normal.  Neurological:     General: No focal deficit present.     Mental Status: He is alert and oriented to person, place, and time.  Psychiatric:        Mood and Affect: Mood is depressed. Affect is flat.  Thought Content: Thought content includes suicidal ideation. Thought content does not include homicidal ideation. Thought content does not include homicidal or suicidal plan.    No results found for any visits on 07/26/23.  The 10-year ASCVD risk score (Arnett DK, et al., 2019) is: 17.6%    Assessment & Plan:   Problem List Items Addressed This Visit       Nervous and Auditory   Dysfunction of both eustachian tubes    Chronic, has been happening for a couple of years  Use Flonase BID for a month  If symptoms do not improve, referral to ENT for evaluation of hearing loss        Other   GAD (generalized anxiety disorder)    Acute  Patient endorses worrying in everyday life  Start Zoloft 50 mg  Referral to psychology      Relevant Medications   sertraline (ZOLOFT) 50 MG tablet   Other Relevant Orders   Ambulatory referral to Psychology   Suicidal ideation    Ideas that patient would be better off if he were not here, he feels a burden to those around him  No active plan to harm himself  Dicussed  plan to have someone close to him to store his firearm for safety  Start Zoloft 50 mg Referral to psychology      Relevant Orders   Ambulatory referral to Psychology   Current moderate episode of major depressive disorder without prior episode (HCC) - Primary    Acute  Start Zoloft 50 mg daily  Referral to psychology  Fu in 6 weeks       Relevant Medications   sertraline (ZOLOFT) 50 MG tablet   Other Relevant Orders   Ambulatory referral to Psychology    Return in about 6 weeks (around 09/06/2023) for MDD/GAD f/u, With PCP.    Rometta Emery, Medical Student   Patient seen along with MS3 student Jodi Marble. I personally evaluated this patient along with the student, and verified all aspects of the history, physical exam, and medical decision making as documented by the student. I agree with the student's documentation and have made all necessary edits.  Jhanvi Drakeford, Marzella Schlein, MD, MPH [facility]

## 2023-07-26 NOTE — Assessment & Plan Note (Signed)
Ideas that patient would be better off if he were not here, he feels a burden to those around him  No active plan to harm himself  Dicussed plan to have someone close to him to store his firearm for safety  Start Zoloft 50 mg Referral to psychology

## 2023-07-26 NOTE — Assessment & Plan Note (Signed)
Acute  Start Zoloft 50 mg daily  Referral to psychology  Fu in 6 weeks

## 2023-07-26 NOTE — Assessment & Plan Note (Signed)
Acute  Patient endorses worrying in everyday life  Start Zoloft 50 mg  Referral to psychology

## 2023-07-28 ENCOUNTER — Other Ambulatory Visit (INDEPENDENT_AMBULATORY_CARE_PROVIDER_SITE_OTHER): Payer: Self-pay | Admitting: Nurse Practitioner

## 2023-07-28 DIAGNOSIS — I739 Peripheral vascular disease, unspecified: Secondary | ICD-10-CM

## 2023-08-02 ENCOUNTER — Ambulatory Visit: Payer: Managed Care, Other (non HMO) | Admitting: Family Medicine

## 2023-08-02 ENCOUNTER — Ambulatory Visit: Payer: Managed Care, Other (non HMO)

## 2023-08-02 ENCOUNTER — Ambulatory Visit (INDEPENDENT_AMBULATORY_CARE_PROVIDER_SITE_OTHER): Payer: Managed Care, Other (non HMO) | Admitting: Nurse Practitioner

## 2023-08-02 ENCOUNTER — Encounter (INDEPENDENT_AMBULATORY_CARE_PROVIDER_SITE_OTHER): Payer: Managed Care, Other (non HMO)

## 2023-08-02 ENCOUNTER — Institutional Professional Consult (permissible substitution): Payer: Managed Care, Other (non HMO) | Admitting: Cardiology

## 2023-08-08 ENCOUNTER — Other Ambulatory Visit: Payer: Self-pay | Admitting: Family Medicine

## 2023-08-09 NOTE — Progress Notes (Signed)
Care Coordination Note  08/09/2023 Name: Keith [name] MRN: [id number] DOB: 08-17-1971  Keith [name] is a 52 y.o. year old male who is a primary care patient of [name], FNP and is actively engaged with the Chronic Care Management team. I reached out to [name] by phone today to assist with re-scheduling an initial visit with the Pharmacist  Follow up plan: Unsuccessful telephone outreach attempt made. A HIPAA compliant phone message was left for the patient providing contact information and requesting a return call.  If patient returns call to provider office, please advise to call CCM Care Guide Penne Lash  at [phone]  Penne Lash, RMA Care Guide [facility]  [place], [place] [address] Direct Dial: [phone] [email or web address]

## 2023-08-10 NOTE — Telephone Encounter (Signed)
Requested medication (s) are due for refill today: Yes  Requested medication (s) are on the active medication list: Yes  Last refill:  07/13/23  Future visit scheduled: No  Notes to clinic:  Manual review.    Requested Prescriptions  Pending Prescriptions Disp Refills   MOUNJARO 2.5 MG/0.5ML Pen [Pharmacy Med Name: Mounjaro 2.5 MG/0.5ML Subcutaneous Solution Pen-injector] 4 mL 0    Sig: INJECT 1 DOSE SUBCUTANEOUSLY ONCE A WEEK     Off-Protocol Failed - 08/08/2023 11:42 AM      Failed - Medication not assigned to a protocol, review manually.      Passed - Valid encounter within last 12 months    Recent Outpatient Visits           2 weeks ago Current moderate episode of major depressive disorder without prior episode [facility])   [place] [facility] [place], Marzella Schlein, MD   1 month ago PAD (peripheral artery disease) [facility])   [place] [facility] Merita Norton T, FNP   7 months ago Hypertension associated with diabetes [facility])    [facility] Alfredia Ferguson, PA-C   10 months ago Tongue coating    [facility] Merita Norton T, FNP   1 year ago Diverticulitis   [facility] [name], FNP       Future Appointments             In 3 weeks [name], FNP [facility], [facility]   In 3 weeks Mariah Milling, Tollie Pizza, MD [facility]

## 2023-08-11 ENCOUNTER — Other Ambulatory Visit (INDEPENDENT_AMBULATORY_CARE_PROVIDER_SITE_OTHER): Payer: Self-pay | Admitting: Nurse Practitioner

## 2023-08-11 DIAGNOSIS — I872 Venous insufficiency (chronic) (peripheral): Secondary | ICD-10-CM

## 2023-08-13 ENCOUNTER — Other Ambulatory Visit: Payer: Self-pay | Admitting: Family Medicine

## 2023-08-13 ENCOUNTER — Encounter: Payer: Self-pay | Admitting: Family Medicine

## 2023-08-13 MED ORDER — TIRZEPATIDE 5 MG/0.5ML ~~LOC~~ SOAJ: 5.0000 mg | SUBCUTANEOUS | 0 refills | Status: DC

## 2023-08-16 ENCOUNTER — Ambulatory Visit (INDEPENDENT_AMBULATORY_CARE_PROVIDER_SITE_OTHER): Payer: Managed Care, Other (non HMO) | Admitting: Nurse Practitioner

## 2023-08-16 ENCOUNTER — Encounter (INDEPENDENT_AMBULATORY_CARE_PROVIDER_SITE_OTHER): Payer: Managed Care, Other (non HMO)

## 2023-08-18 NOTE — Progress Notes (Signed)
Care Coordination Note  08/18/2023 Name: Keith [name] MRN: [id number] DOB: [birth year]  Keith [name] is a 52 y.o. year old male who is a primary care patient of [name], FNP and is actively engaged with the Chronic Care Management team. I reached out to [name] by phone today to assist with re-scheduling an initial visit with the Pharmacist  Follow up plan: Unable to make contact on outreach attempts x 3. PCP [name], FNP notified via routed documentation in medical record.   Penne Lash, RMA Care Guide [facility]  [place], [place] [address] Direct Dial: [phone] [email or web address]

## 2023-08-23 ENCOUNTER — Ambulatory Visit: Payer: Managed Care, Other (non HMO)

## 2023-08-29 NOTE — Progress Notes (Deleted)
Electrophysiology Office Note:    Date:  08/29/2023   ID:  Keith [name], DOB [birth year], MRN [id number]  [facility] Cardiologist:  Julien Nordmann, MD  [facility] Electrophysiologist:  Lanier Prude, MD   Referring MD: Marianne Sofia, PA-C   Chief Complaint: SVT  History of Present Illness:    Keith [name] is a 52 y.o. malewho I am seeing today for an evaluation of SVTat the request of Cadence Furth.  The patient was last seen by Cadence on 04/26/2023.  The patient has a medical history that includes tobacco, dilated CM, HTN, chronic systolic HF, CAD, CKD3, DM. Past HM showed SVT lasting up to 7 minutes.  Today, ***        Their past medical, social and family history was reveiwed.   ROS:   Please see the history of present illness.    All other systems reviewed and are negative.  EKGs/Labs/Other Studies Reviewed:    The following studies were reviewed today:  05/03/2023 Echo EF 35-40 RV normal  04/22/2021 Zio 1547 SVT episodes, longest 7min54seconds Rare ectopy  04/26/2023 ECG shows sinus, LBBB.       Physical Exam:    VS:  There were no vitals taken for this visit.    Wt Readings from Last 3 Encounters:  07/26/23 203 lb (92.1 kg)  07/09/23 205 lb (93 kg)  06/01/23 200 lb (90.7 kg)     GEN: *** Well nourished, well developed in no acute distress CARDIAC: ***RRR, no murmurs, rubs, gallops RESPIRATORY:  Clear to auscultation without rales, wheezing or rhonchi       ASSESSMENT AND PLAN:    No diagnosis found.  #SVT Seen on 2022 Zio monitor.  ?BB ***Repeat Zio to update burden  #Chronic systolic HF NYHA II. Warm and dry on exam. Last EF 35-40 Wide left bundle on ECG Cont farxiga, lasix, entresto, spironolactone Check cardiac MRI to reassess EF, LGE burden. May benefit from resynchronization depending on EF on cMR   F/u with me in 8 weeks to review cMR and Zio results and to finalize treatment  options.    Signed, Rossie Muskrat. Lalla Brothers, MD, [facility], [facility] 08/29/2023 9:49 AM    Electrophysiology [facility]

## 2023-08-30 ENCOUNTER — Ambulatory Visit: Payer: Managed Care, Other (non HMO) | Admitting: Cardiology

## 2023-09-04 NOTE — Progress Notes (Deleted)
NO SHOW

## 2023-09-06 ENCOUNTER — Ambulatory Visit: Payer: Managed Care, Other (non HMO) | Attending: Cardiovascular Disease | Admitting: Cardiovascular Disease

## 2023-09-06 ENCOUNTER — Ambulatory Visit: Payer: Managed Care, Other (non HMO) | Admitting: Family Medicine

## 2023-09-06 DIAGNOSIS — R002 Palpitations: Secondary | ICD-10-CM

## 2023-09-06 DIAGNOSIS — E119 Type 2 diabetes mellitus without complications: Secondary | ICD-10-CM

## 2023-09-06 DIAGNOSIS — I10 Essential (primary) hypertension: Secondary | ICD-10-CM

## 2023-09-06 DIAGNOSIS — Z8679 Personal history of other diseases of the circulatory system: Secondary | ICD-10-CM

## 2023-09-06 DIAGNOSIS — I471 Supraventricular tachycardia, unspecified: Secondary | ICD-10-CM

## 2023-09-06 DIAGNOSIS — I5020 Unspecified systolic (congestive) heart failure: Secondary | ICD-10-CM

## 2023-09-06 DIAGNOSIS — E782 Mixed hyperlipidemia: Secondary | ICD-10-CM

## 2023-09-06 DIAGNOSIS — I2510 Atherosclerotic heart disease of native coronary artery without angina pectoris: Secondary | ICD-10-CM

## 2023-09-20 ENCOUNTER — Ambulatory Visit: Payer: Managed Care, Other (non HMO)

## 2023-09-20 ENCOUNTER — Encounter (INDEPENDENT_AMBULATORY_CARE_PROVIDER_SITE_OTHER): Payer: Managed Care, Other (non HMO)

## 2023-09-20 ENCOUNTER — Ambulatory Visit (INDEPENDENT_AMBULATORY_CARE_PROVIDER_SITE_OTHER): Payer: Managed Care, Other (non HMO) | Admitting: Nurse Practitioner

## 2023-09-21 ENCOUNTER — Encounter: Payer: Self-pay | Admitting: General Practice

## 2023-09-30 ENCOUNTER — Encounter: Payer: Self-pay | Admitting: Family Medicine

## 2023-10-03 ENCOUNTER — Emergency Department: Payer: Managed Care, Other (non HMO)

## 2023-10-03 ENCOUNTER — Emergency Department
Admission: EM | Admit: 2023-10-03 | Discharge: 2023-10-03 | Disposition: A | Discharge status: Home / Self Care | Payer: Managed Care, Other (non HMO) | Attending: Emergency Medicine | Admitting: Emergency Medicine

## 2023-10-03 ENCOUNTER — Other Ambulatory Visit: Payer: Self-pay

## 2023-10-03 DIAGNOSIS — I509 Heart failure, unspecified: Secondary | ICD-10-CM | POA: Insufficient documentation

## 2023-10-03 DIAGNOSIS — R0789 Other chest pain: Secondary | ICD-10-CM

## 2023-10-03 DIAGNOSIS — Z8673 Personal history of transient ischemic attack (TIA), and cerebral infarction without residual deficits: Secondary | ICD-10-CM | POA: Insufficient documentation

## 2023-10-03 DIAGNOSIS — R0602 Shortness of breath: Secondary | ICD-10-CM | POA: Diagnosis present

## 2023-10-03 DIAGNOSIS — R0981 Nasal congestion: Secondary | ICD-10-CM | POA: Insufficient documentation

## 2023-10-03 DIAGNOSIS — Z79899 Other long term (current) drug therapy: Secondary | ICD-10-CM | POA: Diagnosis not present

## 2023-10-03 DIAGNOSIS — E119 Type 2 diabetes mellitus without complications: Secondary | ICD-10-CM | POA: Insufficient documentation

## 2023-10-03 DIAGNOSIS — I2510 Atherosclerotic heart disease of native coronary artery without angina pectoris: Secondary | ICD-10-CM | POA: Insufficient documentation

## 2023-10-03 DIAGNOSIS — R112 Nausea with vomiting, unspecified: Secondary | ICD-10-CM

## 2023-10-03 DIAGNOSIS — I110 Hypertensive heart disease with heart failure: Secondary | ICD-10-CM | POA: Insufficient documentation

## 2023-10-03 DIAGNOSIS — R051 Acute cough: Secondary | ICD-10-CM | POA: Diagnosis not present

## 2023-10-03 LAB — HEPATIC FUNCTION PANEL
ALT: 11 U/L (ref 0–44)
AST: 16 U/L (ref 15–41)
Albumin: 3.9 g/dL (ref 3.5–5.0)
Alkaline Phosphatase: 76 U/L (ref 38–126)
Bilirubin, Direct: 0.1 mg/dL (ref 0.0–0.2)
Indirect Bilirubin: 1.1 mg/dL — ABNORMAL HIGH (ref 0.3–0.9)
Total Bilirubin: 1.2 mg/dL — ABNORMAL HIGH (ref <1.2)
Total Protein: 7 g/dL (ref 6.5–8.1)

## 2023-10-03 LAB — BASIC METABOLIC PANEL
Anion gap: 9 (ref 5–15)
BUN: 10 mg/dL (ref 6–20)
CO2: 28 mmol/L (ref 22–32)
Calcium: 8.9 mg/dL (ref 8.9–10.3)
Chloride: 99 mmol/L (ref 98–111)
Creatinine, Ser: 0.67 mg/dL (ref 0.61–1.24)
GFR, Estimated: >60 mL/min (ref >60)
Glucose, Bld: 180 mg/dL — ABNORMAL HIGH (ref 70–99)
Potassium: 3.6 mmol/L (ref 3.5–5.1)
Sodium: 136 mmol/L (ref 135–145)

## 2023-10-03 LAB — CBC
HCT: 48.1 % (ref 39.0–52.0)
Hemoglobin: 16.5 g/dL (ref 13.0–17.0)
MCH: 29.4 pg (ref 26.0–34.0)
MCHC: 34.3 g/dL (ref 30.0–36.0)
MCV: 85.7 fL (ref 80.0–100.0)
Platelets: 170 10*3/uL (ref 150–400)
RBC: 5.61 MIL/uL (ref 4.22–5.81)
RDW: 12.5 % (ref 11.5–15.5)
WBC: 7 10*3/uL (ref 4.0–10.5)
nRBC: 0 % (ref 0.0–0.2)

## 2023-10-03 LAB — TYPE AND SCREEN
ABO/RH(D): B POS
Antibody Screen: NEGATIVE

## 2023-10-03 LAB — TROPONIN I (HIGH SENSITIVITY)
Troponin I (High Sensitivity): 41 ng/L — ABNORMAL HIGH (ref <18)
Troponin I (High Sensitivity): 37 ng/L — ABNORMAL HIGH (ref <18)

## 2023-10-03 LAB — D-DIMER, QUANTITATIVE: D-Dimer, Quant: <0.27 ug{FEU}/mL (ref 0.00–0.50)

## 2023-10-03 MED ORDER — METOCLOPRAMIDE HCL 10 MG PO TABS: 10.0000 mg | ORAL_TABLET | Freq: Three times a day (TID) | ORAL | 1 refills | Status: DC | PRN

## 2023-10-03 MED ORDER — SODIUM CHLORIDE 0.9 % IV BOLUS
1000.0000 mL | Freq: Once | INTRAVENOUS | Status: AC
Start: 2023-10-03 — End: 2023-10-03
  Administered 2023-10-03: 1000 mL via INTRAVENOUS

## 2023-10-03 MED ORDER — METOCLOPRAMIDE HCL 5 MG/ML IJ SOLN
10.0000 mg | Freq: Once | INTRAMUSCULAR | Status: AC
  Administered 2023-10-03: 10 mg via INTRAVENOUS
  Filled 2023-10-03: qty 2

## 2023-10-03 NOTE — ED Triage Notes (Addendum)
Pt to ED via POV from home. Pt reports yesterday started to feel SOB and felt like his throat was closing. Pt reports this morning vomited and it was coffee ground in color. Pt reports some mild chest tightness this morning that has subsided. Pt states hx of stomach ulcer. Pt denies blood thinner. Pt A&Ox4, ambulatory with clear speech in triage.

## 2023-10-03 NOTE — ED Notes (Signed)
ED Provider at bedside. 

## 2023-10-03 NOTE — ED Provider Notes (Signed)
[facility] Provider Note    Event Date/Time   First MD Initiated Contact with Patient 10/03/23 [phone]     (approximate)   History   Hematemesis and Shortness of Breath   HPI  Keith [name] is a 52 year old male with history of HTN, T2DM, TIA, CAD, CHF presenting to the emergency department for evaluation of multiple complaints.  Yesterday patient felt some shortness of breath and felt like when he was swallowing he was taking a while to pass.  He also had a couple episodes of coughing and noticed a small amount of blood in what he coughed up.  Today, he had an episode of brown emesis that he was concerned could potentially have blood in it, but he did not note any bright red or black material.  No noted blood in stool.  Reports that he had some mild chest tightness this morning that has resolved.  Not on anticoagulation.    Physical Exam   Triage Vital Signs: ED Triage Vitals [10/03/23 0752]  Encounter Vitals Group     BP (!) 122/93     Systolic BP Percentile      Diastolic BP Percentile      Pulse Rate 75     Resp 20     Temp 98 F (36.7 C)     Temp Source Oral     SpO2 98 %     Weight      Height      Head Circumference      Peak Flow      Pain Score 0     Pain Loc      Pain Education      Exclude from Growth Chart     Most recent vital signs: Vitals:   10/03/23 1130 10/03/23 1157  BP: 124/83   Pulse: 61   Resp: 19   Temp:  97.8 F (36.6 C)  SpO2: 94%      General: Awake, interactive  CV:  Regular rate, good peripheral perfusion.  Resp:  Unlabored respirations, lungs clear to auscultation, mild upper airway congestion noted Abd:  Nontender, nondistended, brown stool on rectal exam, Hemoccult negative  Neuro:  Symmetric facial movement, fluid speech   ED Results / Procedures / Treatments   Labs (all labs ordered are listed, but only abnormal results are displayed) Labs Reviewed  BASIC METABOLIC PANEL - Abnormal; Notable  for the following components:      Result Value   Glucose, Bld 180 (*)    All other components within normal limits  HEPATIC FUNCTION PANEL - Abnormal; Notable for the following components:   Total Bilirubin 1.2 (*)    Indirect Bilirubin 1.1 (*)    All other components within normal limits  TROPONIN I (HIGH SENSITIVITY) - Abnormal; Notable for the following components:   Troponin I (High Sensitivity) 41 (*)    All other components within normal limits  TROPONIN I (HIGH SENSITIVITY) - Abnormal; Notable for the following components:   Troponin I (High Sensitivity) 37 (*)    All other components within normal limits  CBC  D-DIMER, QUANTITATIVE (NOT AT [facility])  TYPE AND SCREEN  TYPE AND SCREEN     EKG EKG independently reviewed interpreted by myself (ER attending) demonstrates:  EKG demonstrate sinus rhythm at a rate of 72, PR 192, QRS 174, QTc 539, LBBB noted, more prominent T wave inversions in inferior leads  RADIOLOGY Imaging independently reviewed and interpreted by myself demonstrates:  CXR without  focal consolidation  PROCEDURES:  Critical Care performed: No  Procedures   MEDICATIONS ORDERED IN ED: Medications  sodium chloride 0.9 % bolus 1,000 mL (0 mLs Intravenous Stopped 10/03/23 1030)  metoCLOPramide (REGLAN) injection 10 mg (10 mg Intravenous Given 10/03/23 0848)     IMPRESSION / MDM / ASSESSMENT AND PLAN / ED COURSE  I reviewed the triage vital signs and the nursing notes.  Differential diagnosis includes, but is not limited to, pneumonia, PE, ACS, gastritis, upper GI bleed, Mallory-Weiss tear  Patient's presentation is most consistent with acute presentation with potential threat to life or bodily function.  52 year old male presenting to the emergency department for evaluation of coughing and vomiting with multiple associated complaints.  Small volume hemoptysis reported, but fortunately negative D-dimer here.  Chest x-Jahmeek Shirk negative.  Possible upper  respiratory infection with airway inflammation.  Patient initially with concerns for hematemesis, but in obtaining further history from patient, no clear blood noted in emesis.  Reassuring hemoglobin at 16.5 here.  Normal BUN.  Hemoccult exam negative.  I overall have low suspicion for significant acute GI bleed.  Troponin here was slightly elevated at 41, stable to improved on repeat at 37.  No recent priors for comparison.  Consideration for possible chronic elevation related to his CHF. Patient is chest pain-free.  He was given IV fluids and Reglan and on reevaluation he reports complete resolution of all of his symptoms.  He has not had any further vomiting episodes.  I did discuss admission versus discharge with outpatient follow-up.  Patient is comfortable with discharge.  Will DC with prescription for Reglan.  Will place referral to cardiology with chest discomfort. Strict return precautions provided.  Patient discharged in stable condition.    FINAL CLINICAL IMPRESSION(S) / ED DIAGNOSES   Final diagnoses:  Shortness of breath  Nausea and vomiting, unspecified vomiting type  Acute cough  Chest discomfort     Rx / DC Orders   ED Discharge Orders          Ordered    metoCLOPramide (REGLAN) 10 MG tablet  Every 8 hours PRN        10/03/23 1228    Ambulatory referral to Cardiology       Comments: If you have not heard from the Cardiology office within the next 72 hours please call [phone].   10/03/23 1229             Note:  This document was prepared using Dragon voice recognition software and may include unintentional dictation errors.   Trinna Post, MD 10/03/23 [phone]

## 2023-10-03 NOTE — Discharge Instructions (Addendum)
You were seen in the emergency department today for your cough, chest pain, swallowing difficulties, and vomiting.  Fortunately your testing was overall reassuring.  I have placed a referral to cardiology for follow-up for your chest pain.  I have also sent a prescription for nausea medicine to your pharmacy that you can take as needed.  Return to the ER for new or worsening symptoms.

## 2023-11-07 ENCOUNTER — Encounter: Payer: Self-pay | Admitting: Family Medicine

## 2023-11-07 ENCOUNTER — Other Ambulatory Visit: Payer: Self-pay | Admitting: Family Medicine

## 2023-11-08 MED ORDER — TIRZEPATIDE 5 MG/0.5ML ~~LOC~~ SOAJ: 5.0000 mg | SUBCUTANEOUS | 0 refills | Status: DC

## 2023-11-08 MED ORDER — SERTRALINE HCL 50 MG PO TABS: 50.0000 mg | ORAL_TABLET | Freq: Every day | ORAL | 1 refills | Status: DC

## 2024-01-04 ENCOUNTER — Ambulatory Visit: Payer: Managed Care, Other (non HMO) | Admitting: Family Medicine

## 2024-01-04 ENCOUNTER — Encounter: Payer: Self-pay | Admitting: Family Medicine

## 2024-01-04 VITALS — BP 104/77 | HR 52 | Temp 98.8°F | Ht 71.0 in | Wt 199.6 lb

## 2024-01-04 DIAGNOSIS — K4090 Unilateral inguinal hernia, without obstruction or gangrene, not specified as recurrent: Secondary | ICD-10-CM | POA: Diagnosis not present

## 2024-01-04 DIAGNOSIS — N139 Obstructive and reflux uropathy, unspecified: Secondary | ICD-10-CM | POA: Diagnosis not present

## 2024-01-04 DIAGNOSIS — J069 Acute upper respiratory infection, unspecified: Secondary | ICD-10-CM

## 2024-01-04 DIAGNOSIS — R1031 Right lower quadrant pain: Secondary | ICD-10-CM | POA: Diagnosis not present

## 2024-01-04 LAB — POCT URINALYSIS DIPSTICK
Bilirubin, UA: NEGATIVE
Blood, UA: NEGATIVE
Glucose, UA: NEGATIVE
Ketones, UA: NEGATIVE
Leukocytes, UA: NEGATIVE
Nitrite, UA: NEGATIVE
Protein, UA: POSITIVE — AB
Spec Grav, UA: 1.02 (ref 1.010–1.025)
Urobilinogen, UA: 0.2 U/dL
pH, UA: 7 (ref 5.0–8.0)

## 2024-01-04 MED ORDER — METFORMIN HCL ER 500 MG PO TB24: 1000.0000 mg | ORAL_TABLET | Freq: Two times a day (BID) | ORAL | 0 refills | Status: DC

## 2024-01-04 MED ORDER — ROSUVASTATIN CALCIUM 20 MG PO TABS: 20.0000 mg | ORAL_TABLET | Freq: Every day | ORAL | 0 refills | Status: DC

## 2024-01-04 MED ORDER — SACUBITRIL-VALSARTAN 97-103 MG PO TABS: 1.0000 | ORAL_TABLET | Freq: Two times a day (BID) | ORAL | 0 refills | Status: DC

## 2024-01-04 MED ORDER — TAMSULOSIN HCL 0.4 MG PO CAPS: 0.4000 mg | ORAL_CAPSULE | Freq: Every day | ORAL | 0 refills | Status: DC

## 2024-01-04 NOTE — Progress Notes (Signed)
Acute visit   Patient: Keith [name]   DOB: 26-May-1971   53 y.o. Male  MRN: [id number]  Chief Complaint  Patient presents with   Cough    Dry cough, runny nose, sneezing, weakness X yesterday. Patient reports taking bc powder for headache. States he has taken 4 in the last 24 hours.    Hip Pain    Bad pain lower right side. Reports it is dull and constant. Not sure if he has pulled anything due to lifting things at work. Feeling like he has had to push urine out the last few weeks as well.    Subjective    Discussed the use of AI scribe software for clinical note transcription with the patient, who gave verbal consent to proceed.  History of Present Illness The patient, with a history of smoking, presents with a recent onset of cough and congestion, which started approximately 24 hours prior to the visit. The cough is described as different from the usual smoker's cough, with a whistling quality. The patient reports no sleep the previous night due to the cough. The patient also reports a constant, dull pain in the lower right quadrant of the abdomen, which has been present for about a week. The pain is described as similar to the sensation of being kicked or landing wrong on a bike, but duller. The pain is relieved when the patient is in a recliner and worsens with certain movements, such as bending down. 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 The patient also reports changes in urination, including a weaker stream and dribbling, which started around the same time as the abdominal pain. The patient has to exert more effort to start the stream and sometimes experiences dribbling at the end of urination. The patient denies any burning sensation or blood in the urine.  Review of Systems   Objective    BP 104/77 (BP Location: Left Arm, Patient Position: Sitting, Cuff Size: Large)   Pulse (!) 52   Temp 98.8 F (37.1 C) (Oral)   Ht 5\' 11"   (1.803 m)   Wt 199 lb 9.6 oz (90.5 kg)   SpO2 97%   BMI 27.84 kg/m   Physical Exam Vitals reviewed.  Constitutional:      General: He is not in acute distress.    Appearance: Normal appearance. He is not diaphoretic.  HENT:     Head: Normocephalic and atraumatic.  Eyes:     General: No scleral icterus.    Conjunctiva/sclera: Conjunctivae normal.  Cardiovascular:     Rate and Rhythm: Normal rate and regular rhythm.     Pulses: Normal pulses.     Heart sounds: Normal heart sounds. No murmur heard. Pulmonary:     Effort: Pulmonary effort is normal. No respiratory distress.     Breath sounds: Normal breath sounds. No wheezing or rhonchi.  Abdominal:     General: Bowel sounds are normal. There is no distension.     Palpations: Abdomen is soft. There is no mass.     Tenderness: There is no abdominal tenderness. There is no guarding or rebound.     Hernia: A hernia is present. Hernia is present in the right inguinal area. There is no hernia in the umbilical area, ventral area, left inguinal area, right femoral area or left femoral area.  Musculoskeletal:     Cervical back: Neck supple.     Right lower leg: No  edema.     Left lower leg: No edema.  Lymphadenopathy:     Cervical: No cervical adenopathy.  Skin:    General: Skin is warm and dry.     Findings: No rash.  Neurological:     Mental Status: He is alert and oriented to person, place, and time. Mental status is at baseline.  Psychiatric:        Mood and Affect: Mood normal.        Behavior: Behavior normal.       Results for orders placed or performed in visit on 01/04/24  POCT Urinalysis Dipstick  Result Value Ref Range   Color, UA     Clarity, UA     Glucose, UA Negative Negative   Bilirubin, UA negative    Ketones, UA negative    Spec Grav, UA 1.020 1.010 - 1.025   Blood, UA negative    pH, UA 7.0 5.0 - 8.0   Protein, UA Positive (A) Negative   Urobilinogen, UA 0.2 0.2 or 1.0 E.U./dL   Nitrite, UA negative     Leukocytes, UA Negative Negative   Appearance     Odor      Assessment & Plan     Problem List Items Addressed This Visit   None Visit Diagnoses       Non-recurrent unilateral inguinal hernia without obstruction or gangrene    -  Primary   Relevant Orders   Ambulatory referral to General Surgery     Right lower quadrant abdominal pain       Relevant Orders   POCT Urinalysis Dipstick (Completed)     Lower urinary obstructive symptom         Viral URI with cough          Assessment and Plan Acute Cough and Congestion Acute onset of cough and congestion starting approximately 4:30 PM yesterday. Negative for COVID-19 and influenza. No fever reported. Symptoms include a whistling cough and significant discomfort leading to zero sleep last night. Symptoms improved with movement this morning. Discussed over-the-counter medications for symptom relief and the importance of hand hygiene and mask-wearing to prevent spread. - Recommend over-the-counter medications for cough and cold - Advise on hand hygiene and mask-wearing around others - Avoid work if fever develops  Suspected Inguinal Hernia Right lower quadrant/pelvic pain for about a week, described as constant and dull. Pain exacerbated by certain movements and lifting and relieved by reclining. No previous hernia history.  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 Discussed the nature of hernias, potential complications such as bowel entrapment, and the importance of avoiding straining and heavy lifting. Recommended seeing a surgeon to discuss treatment options, including the possibility of surgical repair if symptoms worsen or become more severe. - Refer to surgeons for evaluation and discussion of treatment options - Advise against straining and heavy lifting - Recommend Tylenol or ibuprofen for pain management - Suggest using a heating pad for comfort prn  Benign Prostatic Hyperplasia  (BPH) Symptoms of urinary hesitancy, weak stream, and occasional dribbling. No burning or hematuria. Symptoms suggestive of prostate enlargement. Previous PSA test in August was normal. No current medication for prostate. Discussed the use of Flomax to improve urinary flow and its mechanism of action. Explained that Flomax should not increase the frequency of urination but will help with the flow. Discussed the affordability and availability of the medication. - Prescribe Flomax 0.4 mg once daily to improve urinary flow - Send prescription  to [facility] pharmacy  General Health Maintenance Has not seen a primary care provider for diabetes management and other health maintenance since [place] left. Needs follow-up for diabetes management and general health maintenance. - Schedule follow-up appointment with Dr. Roxan Hockey for diabetes management and general health maintenance  Follow-up - Schedule follow-up appointment with Dr. Roxan Hockey - Surgeons to call for hernia evaluation appointment.   Meds ordered this encounter  Medications   metFORMIN (GLUCOPHAGE-XR) 500 MG 24 hr tablet    Sig: Take 2 tablets (1,000 mg total) by mouth 2 (two) times daily with a meal.    Dispense:  120 tablet    Refill:  0   rosuvastatin (CRESTOR) 20 MG tablet    Sig: Take 1 tablet (20 mg total) by mouth daily.    Dispense:  30 tablet    Refill:  0   sacubitril-valsartan (ENTRESTO) 97-103 MG    Sig: Take 1 tablet by mouth 2 (two) times daily.    Dispense:  60 tablet    Refill:  0   tamsulosin (FLOMAX) 0.4 MG CAPS capsule    Sig: Take 1 capsule (0.4 mg total) by mouth daily.    Dispense:  30 capsule    Refill:  0     Return in about 2 weeks (around 01/18/2024) for chronic disease f/u, With PCP (time is flexible, needs to establish with new PCP).      Shirlee Latch, MD  [facility] [phone] (phone) [phone] (fax)  [facility]

## 2024-01-07 ENCOUNTER — Encounter: Payer: Self-pay | Admitting: Family Medicine

## 2024-01-22 ENCOUNTER — Encounter: Payer: Self-pay | Admitting: Family Medicine

## 2024-01-23 ENCOUNTER — Encounter: Payer: Self-pay | Admitting: Family Medicine

## 2024-01-24 ENCOUNTER — Ambulatory Visit: Payer: Managed Care, Other (non HMO) | Admitting: Family Medicine

## 2024-01-24 NOTE — Progress Notes (Deleted)
 Established patient visit   Patient: Keith [name]   DOB: April 21, 1971   53 y.o. Male  MRN: [id number] Visit Date: 01/24/2024  Today's healthcare provider: Ronnald Ramp, MD   No chief complaint on file.  Subjective       Discussed the use of AI scribe software for clinical note transcription with the patient, who gave verbal consent to proceed.  History of Present Illness           Needs to contact surgeon for hernia evaluation, see AVS***   Past Medical History:  Diagnosis Date   Chronic combined systolic (congestive) and diastolic (congestive) heart failure (HCC)    a. 06/2017 Echo: EF 35-40%, Gr1 DD; b. 02/2018 Echo: Ef 30-35%; c. 03/2018 Echo: EF 35-40%, mid-apicalanteroseptal, ant, and apical HK. Mildly dil LA. No LV thrombus.   Diabetes mellitus    Erectile dysfunction    Essential hypertension    Frequent headaches    NICM (nonischemic cardiomyopathy) (HCC)    a. 06/2017 Echo: EF 35-40%, Gr1 DD; b. 06/2017 Cath: nonobs dzs; c. 02/2018 Echo: Ef 30-35%; d. 03/2018 Echo: EF 35-40%, mid-apicalanteroseptal, ant, and apical HK. Mildly dil LA. No LV thrombus.   Non-obstructive Coronary Artery Disease    a. 06/2017 Cath: LM nl, LAD nl, D1/2/3 nl, LCX 30p, OM1 nl, OM2 min irregs, OM3/4 nl, RCA 30p/m, RPL2/3 nl.   TIA (transient ischemic attack)    a. 02/2018 MRA w/o acute findings; b. 02/2018 Carotis U/S: no hemodynamically significant stenoses.   Tobacco abuse     Medications: Outpatient Medications Prior to Visit  Medication Sig   allopurinol (ZYLOPRIM) 100 MG tablet Take 1 tablet (100 mg total) by mouth daily.   aspirin EC 81 MG tablet Take 1 tablet (81 mg total) by mouth daily. Swallow whole.   Budeson-Glycopyrrol-Formoterol (BREZTRI AEROSPHERE) 160-9-4.8 MCG/ACT AERO Inhale 2 puffs into the lungs 2 (two) times daily.   Continuous Glucose Receiver (FREESTYLE LIBRE 3 READER) DEVI 1 each by Does not apply route as directed.   Continuous Glucose Sensor  (FREESTYLE LIBRE 3 SENSOR) MISC 1 each by Does not apply route every 14 (fourteen) days. Place 1 sensor on the skin every 14 days. Use to check glucose continuously   dapagliflozin propanediol (FARXIGA) 10 MG TABS tablet Take 1 tablet (10 mg total) by mouth daily before breakfast.   furosemide (LASIX) 40 MG tablet Take 1 tablet (40 mg total) by mouth daily.   Insulin Degludec FlexTouch 100 UNIT/ML SOPN Inject 10 Units into the skin at bedtime. Titrate morning fasting blood sugar to goal of 100-130. Increase by 2 units every 3 days to meet this goal. (Patient not taking: Reported on 01/04/2024)   Insulin Pen Needle (PEN NEEDLES) 32G X 5 MM MISC 1 each by Does not apply route at bedtime. (Patient not taking: Reported on 01/04/2024)   metFORMIN (GLUCOPHAGE-XR) 500 MG 24 hr tablet Take 2 tablets (1,000 mg total) by mouth 2 (two) times daily with a meal.   metoCLOPramide (REGLAN) 10 MG tablet Take 1 tablet (10 mg total) by mouth every 8 (eight) hours as needed for up to 14 days for nausea.   pantoprazole (PROTONIX) 20 MG tablet Take 1 tablet (20 mg total) by mouth daily.   rosuvastatin (CRESTOR) 20 MG tablet Take 1 tablet (20 mg total) by mouth daily.   sacubitril-valsartan (ENTRESTO) 97-103 MG Take 1 tablet by mouth 2 (two) times daily.   sertraline (ZOLOFT) 50 MG tablet Take 1 tablet (50  mg total) by mouth daily.   sildenafil (VIAGRA) 100 MG tablet Take 1 tablet (100 mg total) by mouth daily as needed for erectile dysfunction.   spironolactone (ALDACTONE) 25 MG tablet Take 1 tablet (25 mg total) by mouth daily.   tamsulosin (FLOMAX) 0.4 MG CAPS capsule Take 1 capsule (0.4 mg total) by mouth daily.   tirzepatide [facility]) 5 MG/0.5ML Pen Inject 5 mg into the skin once a week.   No facility-administered medications prior to visit.    Review of Systems  Last metabolic panel Lab Results  Component Value Date   GLUCOSE 180 (H) 10/03/2023   NA 136 10/03/2023   K 3.6 10/03/2023   CL 99 10/03/2023    CO2 28 10/03/2023   BUN 10 10/03/2023   CREATININE 0.67 10/03/2023   GFRNONAA >60 10/03/2023   CALCIUM 8.9 10/03/2023   PHOS 4.0 10/25/2020   PROT 7.0 10/03/2023   ALBUMIN 3.9 10/03/2023   LABGLOB 2.5 07/09/2023   AGRATIO 1.9 12/14/2022   BILITOT 1.2 (H) 10/03/2023   ALKPHOS 76 10/03/2023   AST 16 10/03/2023   ALT 11 10/03/2023   ANIONGAP 9 10/03/2023   Last lipids Lab Results  Component Value Date   CHOL 141 07/09/2023   HDL 34 (L) 07/09/2023   LDLCALC 63 07/09/2023   TRIG 272 (H) 07/09/2023   CHOLHDL 4.1 07/09/2023   Last hemoglobin A1c Lab Results  Component Value Date   HGBA1C 12.9 (H) 07/09/2023   Last thyroid functions Lab Results  Component Value Date   TSH 2.710 07/09/2023     {See past labs  Heme  Chem  Endocrine  Serology  Results Review (optional):1}   Objective    There were no vitals taken for this visit. BP Readings from Last 3 Encounters:  01/04/24 104/77  10/03/23 130/84  07/26/23 123/82   Wt Readings from Last 3 Encounters:  01/04/24 199 lb 9.6 oz (90.5 kg)  07/26/23 203 lb (92.1 kg)  07/09/23 205 lb (93 kg)    {See vitals history (optional):1}    Physical Exam  ***  No results found for any visits on 01/24/24.  Assessment & Plan     Problem List Items Addressed This Visit   None   Assessment and Plan              No follow-ups on file.         Ronnald Ramp, MD  [facility] [phone] (phone) [phone] (fax)  [facility]

## 2024-02-01 MED ORDER — TIRZEPATIDE 5 MG/0.5ML ~~LOC~~ SOAJ: 5.0000 mg | SUBCUTANEOUS | 0 refills | Status: DC

## 2024-02-14 ENCOUNTER — Ambulatory Visit: Admitting: Family Medicine

## 2024-02-14 ENCOUNTER — Encounter: Payer: Self-pay | Admitting: Family Medicine

## 2024-02-14 VITALS — BP 118/83 | HR 83 | Ht 71.0 in | Wt 192.0 lb

## 2024-02-14 DIAGNOSIS — G5793 Unspecified mononeuropathy of bilateral lower limbs: Secondary | ICD-10-CM

## 2024-02-14 DIAGNOSIS — I739 Peripheral vascular disease, unspecified: Secondary | ICD-10-CM

## 2024-02-14 DIAGNOSIS — F17200 Nicotine dependence, unspecified, uncomplicated: Secondary | ICD-10-CM

## 2024-02-14 DIAGNOSIS — E1169 Type 2 diabetes mellitus with other specified complication: Secondary | ICD-10-CM

## 2024-02-14 DIAGNOSIS — I152 Hypertension secondary to endocrine disorders: Secondary | ICD-10-CM

## 2024-02-14 DIAGNOSIS — Z7984 Long term (current) use of oral hypoglycemic drugs: Secondary | ICD-10-CM

## 2024-02-14 DIAGNOSIS — E0840 Diabetes mellitus due to underlying condition with diabetic neuropathy, unspecified: Secondary | ICD-10-CM

## 2024-02-14 DIAGNOSIS — I5020 Unspecified systolic (congestive) heart failure: Secondary | ICD-10-CM

## 2024-02-14 DIAGNOSIS — E1159 Type 2 diabetes mellitus with other circulatory complications: Secondary | ICD-10-CM | POA: Diagnosis not present

## 2024-02-14 DIAGNOSIS — E785 Hyperlipidemia, unspecified: Secondary | ICD-10-CM

## 2024-02-14 MED ORDER — ROSUVASTATIN CALCIUM 20 MG PO TABS: 20.0000 mg | ORAL_TABLET | Freq: Every day | ORAL | 3 refills | Status: DC

## 2024-02-14 NOTE — Progress Notes (Signed)
 Established patient visit   Patient: Keith [name]   DOB: January 06, 1971   53 y.o. [name]  MRN: [id number] Visit Date: 02/14/2024  Today's healthcare provider: Ronnald Ramp, MD   Chief Complaint  Patient presents with   Establish Care    No concerns    Subjective     HPI     Establish Care    Additional comments: No concerns       Last edited by Thedora Hinders, CMA on 02/14/2024  3:35 PM.       Discussed the use of AI scribe software for clinical note transcription with the patient, who gave verbal consent to proceed.  History of Present Illness Keith [name] is a 53 year old [name] with unstable angina, peripheral arterial disease, coronary artery disease, hypertension, type two diabetes, tobacco use, heart failure, and reduced ejection fraction who presents for follow-up of chronic conditions.  He has a history of type two diabetes with a last hemoglobin A1c of 12.9. He is currently on Mounjaro and metformin 1000 mg twice daily for diabetes management and reports no issues with these medications, stating he is 'working good'.  He experiences neuropathy symptoms, describing 'lightning strikes' in his fingers and a burning sensation in his feet, which he alleviates by soaking them in ice water for about five minutes each night. He mentions that his feet feel like they are sunburned and are very sensitive to touch.  His current medication regimen includes Breztri 160-9-4.8 mcg twice daily, Carteolol 12.5 mg twice daily, Lasix 40 mg daily, Entresto 97-103 mg twice daily, Zoloft 50 mg daily, spironolactone 25 mg daily, Flomax 0.4 mg daily, and allopurinol 100 mg weekly. He is not taking Farxiga, insulin, Protonix, or Crestor.  He mentions that his diet and exercise are influenced by his work, and he typically eats only when hungry. He reports that his triglycerides were 272 in August, and his LDL was 63, while his HDL was 34. He experiences swelling in  his fingers when blood is drawn from them, preferring blood draws from other sites.     Past Medical History:  Diagnosis Date   Chronic combined systolic (congestive) and diastolic (congestive) heart failure (HCC)    a. 06/2017 Echo: EF 35-40%, Gr1 DD; b. 02/2018 Echo: Ef 30-35%; c. 03/2018 Echo: EF 35-40%, mid-apicalanteroseptal, ant, and apical HK. Mildly dil LA. No LV thrombus.   Diabetes mellitus    Erectile dysfunction    Essential hypertension    Frequent headaches    NICM (nonischemic cardiomyopathy) (HCC)    a. 06/2017 Echo: EF 35-40%, Gr1 DD; b. 06/2017 Cath: nonobs dzs; c. 02/2018 Echo: Ef 30-35%; d. 03/2018 Echo: EF 35-40%, mid-apicalanteroseptal, ant, and apical HK. Mildly dil LA. No LV thrombus.   Non-obstructive Coronary Artery Disease    a. 06/2017 Cath: LM nl, LAD nl, D1/2/3 nl, LCX 30p, OM1 nl, OM2 min irregs, OM3/4 nl, RCA 30p/m, RPL2/3 nl.   TIA (transient ischemic attack)    a. 02/2018 MRA w/o acute findings; b. 02/2018 Carotis U/S: no hemodynamically significant stenoses.   Tobacco abuse     Medications: Outpatient Medications Prior to Visit  Medication Sig   aspirin EC 81 MG tablet Take 1 tablet (81 mg total) by mouth daily. Swallow whole.   Budeson-Glycopyrrol-Formoterol (BREZTRI AEROSPHERE) 160-9-4.8 MCG/ACT AERO Inhale 2 puffs into the lungs 2 (two) times daily.   carvedilol (COREG) 12.5 MG tablet Take 12.5 mg by mouth 2 (two) times daily.  Continuous Glucose Receiver (FREESTYLE LIBRE 3 READER) DEVI 1 each by Does not apply route as directed.   Continuous Glucose Sensor (FREESTYLE LIBRE 3 SENSOR) MISC 1 each by Does not apply route every 14 (fourteen) days. Place 1 sensor on the skin every 14 days. Use to check glucose continuously   furosemide (LASIX) 40 MG tablet Take 1 tablet (40 mg total) by mouth daily.   Insulin Pen Needle (PEN NEEDLES) 32G X 5 MM MISC 1 each by Does not apply route at bedtime.   metFORMIN (GLUCOPHAGE-XR) 500 MG 24 hr tablet Take 2 tablets (1,000  mg total) by mouth 2 (two) times daily with a meal.   sacubitril-valsartan (ENTRESTO) 97-103 MG Take 1 tablet by mouth 2 (two) times daily.   sertraline (ZOLOFT) 50 MG tablet Take 1 tablet (50 mg total) by mouth daily.   sildenafil (VIAGRA) 100 MG tablet Take 1 tablet (100 mg total) by mouth daily as needed for erectile dysfunction.   spironolactone (ALDACTONE) 25 MG tablet Take 1 tablet (25 mg total) by mouth daily.   tamsulosin (FLOMAX) 0.4 MG CAPS capsule Take 1 capsule (0.4 mg total) by mouth daily.   tirzepatide [facility]) 5 MG/0.5ML Pen Inject 5 mg into the skin once a week.   allopurinol (ZYLOPRIM) 100 MG tablet Take 1 tablet (100 mg total) by mouth daily. (Patient not taking: Reported on 02/14/2024)   dapagliflozin propanediol (FARXIGA) 10 MG TABS tablet Take 1 tablet (10 mg total) by mouth daily before breakfast. (Patient not taking: Reported on 02/14/2024)   Insulin Degludec FlexTouch 100 UNIT/ML SOPN Inject 10 Units into the skin at bedtime. Titrate morning fasting blood sugar to goal of 100-130. Increase by 2 units every 3 days to meet this goal. (Patient not taking: Reported on 02/14/2024)   metoCLOPramide (REGLAN) 10 MG tablet Take 1 tablet (10 mg total) by mouth every 8 (eight) hours as needed for up to 14 days for nausea.   pantoprazole (PROTONIX) 20 MG tablet Take 1 tablet (20 mg total) by mouth daily. (Patient not taking: Reported on 02/14/2024)   [DISCONTINUED] rosuvastatin (CRESTOR) 20 MG tablet Take 1 tablet (20 mg total) by mouth daily. (Patient not taking: Reported on 02/14/2024)   No facility-administered medications prior to visit.    Review of Systems  Last metabolic panel Lab Results  Component Value Date   GLUCOSE 180 (H) 10/03/2023   NA 136 10/03/2023   K 3.6 10/03/2023   CL 99 10/03/2023   CO2 28 10/03/2023   BUN 10 10/03/2023   CREATININE 0.67 10/03/2023   GFRNONAA >60 10/03/2023   CALCIUM 8.9 10/03/2023   PHOS 4.0 10/25/2020   PROT 7.0 10/03/2023   ALBUMIN  3.9 10/03/2023   LABGLOB 2.5 07/09/2023   AGRATIO 1.9 12/14/2022   BILITOT 1.2 (H) 10/03/2023   ALKPHOS 76 10/03/2023   AST 16 10/03/2023   ALT 11 10/03/2023   ANIONGAP 9 10/03/2023   Last lipids Lab Results  Component Value Date   CHOL 141 07/09/2023   HDL 34 (L) 07/09/2023   LDLCALC 63 07/09/2023   TRIG 272 (H) 07/09/2023   CHOLHDL 4.1 07/09/2023   Last hemoglobin A1c Lab Results  Component Value Date   HGBA1C 12.9 (H) 07/09/2023   Last thyroid functions Lab Results  Component Value Date   TSH 2.710 07/09/2023        Objective    BP 118/83   Pulse 83   Ht 5\' 11"  (1.803 m)   Wt 192 lb (87.1  kg)   SpO2 97%   BMI 26.78 kg/m  BP Readings from Last 3 Encounters:  02/14/24 118/83  01/04/24 104/77  10/03/23 130/84   Wt Readings from Last 3 Encounters:  02/14/24 192 lb (87.1 kg)  01/04/24 199 lb 9.6 oz (90.5 kg)  07/26/23 203 lb (92.1 kg)        Physical Exam Cardiovascular:     Pulses:          Dorsalis pedis pulses are 2+ on the right side and 2+ on the left side.       Posterior tibial pulses are 2+ on the right side and 2+ on the left side.     Heart sounds:     Gallop present.  Pulmonary:     Effort: Pulmonary effort is normal.     Breath sounds: No wheezing, rhonchi or rales.  Musculoskeletal:     Right foot: Normal range of motion. No deformity or bunion.     Left foot: Normal range of motion. No deformity or bunion.  Feet:     Right foot:     Protective Sensation: 6 sites tested.  2 sites sensed.     Skin integrity: Skin integrity normal. No erythema.     Toenail Condition: Right toenails are normal.     Left foot:     Protective Sensation: 6 sites tested.  2 sites sensed.     Skin integrity: Skin integrity normal. No erythema.     Toenail Condition: Left toenails are normal.      No results found for any visits on 02/14/24.  Assessment & Plan     Problem List Items Addressed This Visit       Cardiovascular and Mediastinum   PAD  (peripheral artery disease) (HCC)   Relevant Medications   carvedilol (COREG) 12.5 MG tablet   rosuvastatin (CRESTOR) 20 MG tablet   Hypertension associated with diabetes (HCC)   Relevant Medications   carvedilol (COREG) 12.5 MG tablet   rosuvastatin (CRESTOR) 20 MG tablet   Other Relevant Orders   Comprehensive metabolic panel with GFR   HFrEF (heart failure with reduced ejection fraction) (HCC)   Relevant Medications   carvedilol (COREG) 12.5 MG tablet   rosuvastatin (CRESTOR) 20 MG tablet     Endocrine   Hyperlipidemia associated with type 2 diabetes mellitus (HCC)   Relevant Medications   carvedilol (COREG) 12.5 MG tablet   rosuvastatin (CRESTOR) 20 MG tablet   Other Relevant Orders   Lipid panel   Diabetes mellitus due to underlying condition with diabetic neuropathy, without long-term current use of insulin (HCC) - Primary   Relevant Medications   rosuvastatin (CRESTOR) 20 MG tablet   Other Relevant Orders   Hemoglobin A1c   Ambulatory referral to Ophthalmology   Ambulatory referral to Podiatry     Other   Tobacco dependence   Other Visit Diagnoses       Neuropathy of both feet       Relevant Orders   Ambulatory referral to Podiatry        Assessment & Plan Type 2 Diabetes Mellitus Chronic Type 2 diabetes with poor glycemic control, last A1c was 12.9 in August of last year. Currently on Mounjaro and metformin 1000 mg twice daily. Continuous glucose monitor in use. Dietary habits may affect glucose control. - Order hemoglobin A1c test - continue moounjaro 5mg  weekly and metformin 1000mg  BID  - Perform diabetes foot exam, abnormal monofilament testing  - Refer to podiatry for foot  care CMP ordered  - Encourage scheduling an eye exam at [facility], ophthalmology referral submitted today   Peripheral Neuropathy Peripheral neuropathy likely secondary to diabetes, with symptoms of burning sensation and pain in feet, requiring ice water immersion for relief.  Decreased sensation noted during foot exam. Discussed potential use of gabapentin or Lyrica for symptom management, considering cost and previous experience. - Refer to podiatry for further evaluation and management - Consider gabapentin or Lyrica for neuropathy management  Coronary Artery Disease Coronary artery disease with current LDL cholesterol at 63, HDL at 34, and triglycerides at 272 in August. Discussed importance of managing cholesterol levels to reduce cardiovascular risk, especially with concurrent diabetes. - Order lipid panel to reassess cholesterol levels - Reinstate Crestor 20 mg for cholesterol management  Heart Failure with Reduced Ejection Fraction Chronic heart failure with reduced ejection fraction, managed with Entresto, Lasix, and spironolactone.  Hypertension Chronic  Hypertension is well-controlled with blood pressure readings of 118/83 and 104/77. - continue coreg 12.5mg  BID and entresto 97-103mg  twice daily and spironolactone 25mg  daily  - continue to follow up with cardiology   General Health Maintenance Emphasized regular eye exams due to diabetes and risk of diabetic retinopathy. Encouraged lifestyle modifications including diet and exercise to manage diabetes and cardiovascular risk. - Encourage regular eye exams - Promote lifestyle modifications including diet and exercise  Follow-up Follow-up in three months for reassessment and review of lab results. - Schedule follow-up appointment in three months - Perform blood tests including A1c and lipid panel                Return in about 3 months (around 05/15/2024) for DM, CHRONIC F/U.         Ronnald Ramp, MD  [facility] [phone] (phone) [phone] (fax)  [facility]

## 2024-02-15 ENCOUNTER — Encounter: Payer: Self-pay | Admitting: Family Medicine

## 2024-02-15 LAB — COMPREHENSIVE METABOLIC PANEL WITH GFR
ALT: 6 IU/L (ref 0–44)
AST: 12 IU/L (ref 0–40)
Albumin: 4.3 g/dL (ref 3.8–4.9)
Alkaline Phosphatase: 77 IU/L (ref 44–121)
BUN/Creatinine Ratio: 14 (ref 9–20)
BUN: 11 mg/dL (ref 6–24)
Bilirubin Total: 0.7 mg/dL (ref 0.0–1.2)
CO2: 24 mmol/L (ref 20–29)
Calcium: 9.2 mg/dL (ref 8.7–10.2)
Chloride: 102 mmol/L (ref 96–106)
Creatinine, Ser: 0.79 mg/dL (ref 0.76–1.27)
Globulin, Total: 2.4 g/dL (ref 1.5–4.5)
Glucose: 151 mg/dL — ABNORMAL HIGH (ref 70–99)
Potassium: 3.7 mmol/L (ref 3.5–5.2)
Sodium: 142 mmol/L (ref 134–144)
Total Protein: 6.7 g/dL (ref 6.0–8.5)
eGFR: 106 mL/min/{1.73_m2} (ref >59)

## 2024-02-15 LAB — LIPID PANEL
Chol/HDL Ratio: 5.2 ratio — ABNORMAL HIGH (ref 0.0–5.0)
Cholesterol, Total: 152 mg/dL (ref 100–199)
HDL: 29 mg/dL — ABNORMAL LOW (ref >39)
LDL Chol Calc (NIH): 73 mg/dL (ref 0–99)
Triglycerides: 309 mg/dL — ABNORMAL HIGH (ref 0–149)
VLDL Cholesterol Cal: 50 mg/dL — ABNORMAL HIGH (ref 5–40)

## 2024-02-15 LAB — HEMOGLOBIN A1C
Est. average glucose Bld gHb Est-mCnc: 151 mg/dL
Hgb A1c MFr Bld: 6.9 % — ABNORMAL HIGH (ref 4.8–5.6)

## 2024-03-13 ENCOUNTER — Other Ambulatory Visit: Payer: Self-pay | Admitting: Family Medicine

## 2024-03-13 NOTE — Telephone Encounter (Signed)
 [facility] is requesting refills on Sildenafil  100 mg. #30 with refills

## 2024-03-13 NOTE — Addendum Note (Signed)
 Addended by: Bart Lieu on: 03/13/2024 02:55 PM   Modules accepted: Orders

## 2024-03-13 NOTE — Telephone Encounter (Signed)
 LOV 3*31*25 NOV none on file  LRF [id number] LABS 3*31*25

## 2024-03-15 MED ORDER — SILDENAFIL CITRATE 100 MG PO TABS: 100.0000 mg | ORAL_TABLET | Freq: Every day | ORAL | 0 refills | Status: DC | PRN

## 2024-04-04 ENCOUNTER — Encounter (INDEPENDENT_AMBULATORY_CARE_PROVIDER_SITE_OTHER): Payer: Self-pay

## 2024-04-09 ENCOUNTER — Other Ambulatory Visit: Payer: Self-pay

## 2024-04-09 ENCOUNTER — Emergency Department

## 2024-04-09 ENCOUNTER — Emergency Department
Admission: EM | Admit: 2024-04-09 | Discharge: 2024-04-09 | Disposition: A | Discharge status: Home / Self Care | Attending: Emergency Medicine | Admitting: Emergency Medicine

## 2024-04-09 DIAGNOSIS — R42 Dizziness and giddiness: Secondary | ICD-10-CM | POA: Diagnosis not present

## 2024-04-09 DIAGNOSIS — I5089 Other heart failure: Secondary | ICD-10-CM | POA: Insufficient documentation

## 2024-04-09 DIAGNOSIS — R110 Nausea: Secondary | ICD-10-CM | POA: Diagnosis not present

## 2024-04-09 DIAGNOSIS — I110 Hypertensive heart disease with heart failure: Secondary | ICD-10-CM | POA: Diagnosis not present

## 2024-04-09 DIAGNOSIS — I2510 Atherosclerotic heart disease of native coronary artery without angina pectoris: Secondary | ICD-10-CM | POA: Diagnosis not present

## 2024-04-09 DIAGNOSIS — E119 Type 2 diabetes mellitus without complications: Secondary | ICD-10-CM | POA: Insufficient documentation

## 2024-04-09 DIAGNOSIS — R0602 Shortness of breath: Secondary | ICD-10-CM | POA: Diagnosis not present

## 2024-04-09 DIAGNOSIS — R0789 Other chest pain: Secondary | ICD-10-CM | POA: Diagnosis present

## 2024-04-09 DIAGNOSIS — R079 Chest pain, unspecified: Secondary | ICD-10-CM

## 2024-04-09 LAB — CBC
HCT: 46.7 % (ref 39.0–52.0)
Hemoglobin: 16.4 g/dL (ref 13.0–17.0)
MCH: 30.1 pg (ref 26.0–34.0)
MCHC: 35.1 g/dL (ref 30.0–36.0)
MCV: 85.7 fL (ref 80.0–100.0)
Platelets: 148 10*3/uL — ABNORMAL LOW (ref 150–400)
RBC: 5.45 MIL/uL (ref 4.22–5.81)
RDW: 12.7 % (ref 11.5–15.5)
WBC: 5.7 10*3/uL (ref 4.0–10.5)
nRBC: 0 % (ref 0.0–0.2)

## 2024-04-09 LAB — TROPONIN I (HIGH SENSITIVITY)
Troponin I (High Sensitivity): 12 ng/L (ref <18)
Troponin I (High Sensitivity): 12 ng/L (ref <18)

## 2024-04-09 LAB — BASIC METABOLIC PANEL WITH GFR
Anion gap: 10 (ref 5–15)
BUN: 14 mg/dL (ref 6–20)
CO2: 25 mmol/L (ref 22–32)
Calcium: 8.7 mg/dL — ABNORMAL LOW (ref 8.9–10.3)
Chloride: 101 mmol/L (ref 98–111)
Creatinine, Ser: 0.68 mg/dL (ref 0.61–1.24)
GFR, Estimated: >60 mL/min (ref >60)
Glucose, Bld: 144 mg/dL — ABNORMAL HIGH (ref 70–99)
Potassium: 3.6 mmol/L (ref 3.5–5.1)
Sodium: 136 mmol/L (ref 135–145)

## 2024-04-09 LAB — MAGNESIUM: Magnesium: 1.7 mg/dL (ref 1.7–2.4)

## 2024-04-09 MED ORDER — ONDANSETRON HCL 4 MG/2ML IJ SOLN
4.0000 mg | Freq: Once | INTRAMUSCULAR | Status: AC
  Administered 2024-04-09: 4 mg via INTRAVENOUS
  Filled 2024-04-09: qty 2

## 2024-04-09 NOTE — ED Notes (Signed)
 Pt given warm blanket.

## 2024-04-09 NOTE — ED Notes (Signed)
 Pt stating feeling better, no N/V at this time.

## 2024-04-09 NOTE — ED Provider Notes (Signed)
 [facility] Provider Note    Event Date/Time   First MD Initiated Contact with Patient 04/09/24 1011     (approximate)   History   Chest Pain   HPI Keith [name] is a 53 y.o. male with history of HFrEF, CAD, MI, DM2, HTN, HLD presenting today for chest pain.  Patient states earlier this morning he was sitting at rest when he started feeling pressure on his chest.  He had associated shortness of breath, lightheadedness, and sweating on the top of his head.  Was also feeling some mild nausea.  EMS gave aspirin  324 mg and he reports almost complete resolution of symptoms at this time.  Was not worsened by exertion.  No recent chest pain like this.  States he has had several episodes within the past couple of years where he gets lightheaded with nausea and it usually resolves after eat some food.  He states the symptoms today are largely similar to that 1.  Does note diminished p.o. intake.  Has not checked his blood sugar during any of these events.  Blood sugar with EMS was reportedly normal.     Physical Exam   Triage Vital Signs: ED Triage Vitals  Encounter Vitals Group     BP 04/09/24 1010 (!) 154/99     Systolic BP Percentile --      Diastolic BP Percentile --      Pulse Rate 04/09/24 1020 75     Resp 04/09/24 1020 19     Temp 04/09/24 1020 98.2 F (36.8 C)     Temp Source 04/09/24 1020 Oral     SpO2 04/09/24 1020 96 %     Weight 04/09/24 1017 180 lb (81.6 kg)     Height 04/09/24 1017 5\' 8"  (1.727 m)     Head Circumference --      Peak Flow --      Pain Score 04/09/24 1014 0     Pain Loc --      Pain Education --      Exclude from Growth Chart --     Most recent vital signs: Vitals:   04/09/24 1200 04/09/24 1300  BP: 130/75 125/86  Pulse: 69 67  Resp: 15 10  Temp:    SpO2: 93% 96%   Physical Exam: I have reviewed the vital signs and nursing notes. General: Awake, alert, no acute distress.  Nontoxic appearing. Head:  Atraumatic,  normocephalic.   ENT:  EOM intact, PERRL. Oral mucosa is pink and moist with no lesions. Neck: Neck is supple with full range of motion, No meningeal signs. Cardiovascular:  RRR, No murmurs. Peripheral pulses palpable and equal bilaterally. Respiratory:  Symmetrical chest wall expansion.  No rhonchi, rales, or wheezes.  Good air movement throughout.  No use of accessory muscles.   Musculoskeletal:  No cyanosis or edema. Moving extremities with full ROM Abdomen:  Soft, nontender, nondistended. Neuro:  GCS 15, moving all four extremities, interacting appropriately. Speech clear. Psych:  Calm, appropriate.   Skin:  Warm, dry, no rash.    ED Results / Procedures / Treatments   Labs (all labs ordered are listed, but only abnormal results are displayed) Labs Reviewed  BASIC METABOLIC PANEL WITH GFR - Abnormal; Notable for the following components:      Result Value   Glucose, Bld 144 (*)    Calcium  8.7 (*)    All other components within normal limits  CBC - Abnormal; Notable for the following components:  Platelets 148 (*)    All other components within normal limits  MAGNESIUM  TROPONIN I (HIGH SENSITIVITY)  TROPONIN I (HIGH SENSITIVITY)     EKG My EKG interpretation: Rate of 76, normal sinus rhythm, left bundle branch block.  No obvious ST elevations or depressions and overall looks similar to most recent EKG in his chart   RADIOLOGY Independently interpreted chest x-ray with no acute pathology   PROCEDURES:  Critical Care performed: No  Procedures   MEDICATIONS ORDERED IN ED: Medications  ondansetron  (ZOFRAN ) injection 4 mg (4 mg Intravenous Given 04/09/24 1049)     IMPRESSION / MDM / ASSESSMENT AND PLAN / ED COURSE  I reviewed the triage vital signs and the nursing notes.                              Differential diagnosis includes, but is not limited to, ACS, hypoglycemia, anginal symptoms, pneumonia, orthostatic hypotension, vasovagal  Patient's presentation  is most consistent with acute complicated illness / injury requiring diagnostic workup.  Patient is a 53 year old male presenting today for episode of chest pain, lightheadedness, shortness of breath, and nausea.  States he gets these episodes several times throughout the year with none in the past couple of months.  Prior to arrival he had complete resolution of symptoms and is asymptomatic here.  EKG with left bundle branch block and no other signs of ischemia.  Consistent with his baseline.  Vital signs are stable.  Chest x-ray unremarkable.  CBC, BMP, magnesium largely reassuring.  Troponins are negative x 2.  It does not sound classical ACS given that it seems slightly more vasovagal in nature with the chest pain, lightheadedness, nausea, and shortness of breath that resolved quickly.  The symptoms also resolve immediately after he takes any p.o. intake.  No hypoglycemia seen here today.  Given his cardiac history, we did discuss admission for further workup but with negative troponins, he feels comfortable following up with his cardiologist this week.  Given strict return precautions for any repeat or worsening symptoms.  The patient is on the cardiac monitor to evaluate for evidence of arrhythmia and/or significant heart rate changes.     FINAL CLINICAL IMPRESSION(S) / ED DIAGNOSES   Final diagnoses:  Chest pain, unspecified type     Rx / DC Orders   ED Discharge Orders          Ordered    Ambulatory referral to Cardiology       Comments: If you have not heard from the Cardiology office within the next 72 hours please call [phone].   04/09/24 1334             Note:  This document was prepared using Dragon voice recognition software and may include unintentional dictation errors.   Kandee Orion, MD 04/09/24 1336

## 2024-04-09 NOTE — ED Triage Notes (Signed)
 Pt BIB [facility] from Home for CP and dizziness when standing. BP on scene 170-180's systolic, was given 324 ASA. BP came down to 140's systolic en route. EMS also reporting pt was diaphoretic, stating head was sweaty. Denies N/V, Aox4.

## 2024-04-09 NOTE — Discharge Instructions (Addendum)
 Please call your cardiology team tomorrow for an outpatient follow-up visit within the next week.  Your workup was otherwise reassuring today.

## 2024-04-09 NOTE — ED Notes (Signed)
 Pt asking for some crackers to see if it helps, given the OK by Dr Karlynn Oyster. Pt given graham and saltine crackers.

## 2024-04-16 ENCOUNTER — Other Ambulatory Visit: Payer: Self-pay | Admitting: Family Medicine

## 2024-05-15 ENCOUNTER — Ambulatory Visit: Admitting: Family Medicine

## 2024-06-26 ENCOUNTER — Ambulatory Visit: Admitting: Family Medicine

## 2024-06-26 NOTE — Progress Notes (Deleted)
 Established patient visit   Patient: Keith [name]   DOB: 04-20-1971   53 y.o. Male  MRN: [id number] Visit Date: 06/26/2024  Today's healthcare provider: Rockie Agent, MD   No chief complaint on file.  Subjective       Discussed the use of AI scribe software for clinical note transcription with the patient, who gave verbal consent to proceed.  History of Present Illness      Past Medical History:  Diagnosis Date   Chronic combined systolic (congestive) and diastolic (congestive) heart failure (HCC)    a. 06/2017 Echo: EF 35-40%, Gr1 DD; b. 02/2018 Echo: Ef 30-35%; c. 03/2018 Echo: EF 35-40%, mid-apicalanteroseptal, ant, and apical HK. Mildly dil LA. No LV thrombus.   Diabetes mellitus    Erectile dysfunction    Essential hypertension    Frequent headaches    NICM (nonischemic cardiomyopathy) (HCC)    a. 06/2017 Echo: EF 35-40%, Gr1 DD; b. 06/2017 Cath: nonobs dzs; c. 02/2018 Echo: Ef 30-35%; d. 03/2018 Echo: EF 35-40%, mid-apicalanteroseptal, ant, and apical HK. Mildly dil LA. No LV thrombus.   Non-obstructive Coronary Artery Disease    a. 06/2017 Cath: LM nl, LAD nl, D1/2/3 nl, LCX 30p, OM1 nl, OM2 min irregs, OM3/4 nl, RCA 30p/m, RPL2/3 nl.   TIA (transient ischemic attack)    a. 02/2018 MRA w/o acute findings; b. 02/2018 Carotis U/S: no hemodynamically significant stenoses.   Tobacco abuse     Medications: Outpatient Medications Prior to Visit  Medication Sig   allopurinol  (ZYLOPRIM ) 100 MG tablet Take 1 tablet (100 mg total) by mouth daily. (Patient not taking: Reported on 02/14/2024)   aspirin  EC 81 MG tablet Take 1 tablet (81 mg total) by mouth daily. Swallow whole.   Budeson-Glycopyrrol-Formoterol (BREZTRI  AEROSPHERE) 160-9-4.8 MCG/ACT AERO Inhale 2 puffs into the lungs 2 (two) times daily.   carvedilol  (COREG ) 12.5 MG tablet Take 12.5 mg by mouth 2 (two) times daily.   Continuous Glucose Receiver (FREESTYLE LIBRE 3 READER) DEVI 1 each by Does not  apply route as directed.   Continuous Glucose Sensor (FREESTYLE LIBRE 3 SENSOR) MISC 1 each by Does not apply route every 14 (fourteen) days. Place 1 sensor on the skin every 14 days. Use to check glucose continuously   dapagliflozin  propanediol (FARXIGA ) 10 MG TABS tablet Take 1 tablet (10 mg total) by mouth daily before breakfast. (Patient not taking: Reported on 02/14/2024)   furosemide  (LASIX ) 40 MG tablet Take 1 tablet (40 mg total) by mouth daily.   Insulin  Degludec FlexTouch 100 UNIT/ML SOPN Inject 10 Units into the skin at bedtime. Titrate morning fasting blood sugar to goal of 100-130. Increase by 2 units every 3 days to meet this goal. (Patient not taking: Reported on 02/14/2024)   Insulin  Pen Needle (PEN NEEDLES) 32G X 5 MM MISC 1 each by Does not apply route at bedtime.   metFORMIN  (GLUCOPHAGE -XR) 500 MG 24 hr tablet Take 2 tablets (1,000 mg total) by mouth 2 (two) times daily with a meal.   metoCLOPramide  (REGLAN ) 10 MG tablet Take 1 tablet (10 mg total) by mouth every 8 (eight) hours as needed for up to 14 days for nausea.   MOUNJARO  5 MG/0.5ML Pen INJECT 5MG  SUBCUTANEOUSLY  ONCE A WEEK   pantoprazole  (PROTONIX ) 20 MG tablet Take 1 tablet (20 mg total) by mouth daily. (Patient not taking: Reported on 02/14/2024)   rosuvastatin  (CRESTOR ) 20 MG tablet Take 1 tablet (20 mg total) by mouth daily.  sacubitril -valsartan  (ENTRESTO ) 97-103 MG Take 1 tablet by mouth 2 (two) times daily.   sertraline  (ZOLOFT ) 50 MG tablet Take 1 tablet (50 mg total) by mouth daily.   sildenafil  (VIAGRA ) 100 MG tablet Take 1 tablet (100 mg total) by mouth daily as needed for erectile dysfunction.   spironolactone  (ALDACTONE ) 25 MG tablet Take 1 tablet (25 mg total) by mouth daily.   tamsulosin  (FLOMAX ) 0.4 MG CAPS capsule Take 1 capsule (0.4 mg total) by mouth daily.   No facility-administered medications prior to visit.    Review of Systems  {Insert previous labs (optional):23779} {See past labs  Heme   Chem  Endocrine  Serology  Results Review (optional):1}   Objective    There were no vitals taken for this visit. {Insert last BP/Wt (optional):23777}{See vitals history (optional):1}    Physical Exam  ***  No results found for any visits on 06/26/24.  Assessment & Plan     Problem List Items Addressed This Visit       Cardiovascular and Mediastinum   Hypertension associated with diabetes (HCC) - Primary   HFrEF (heart failure with reduced ejection fraction) (HCC)   Coronary artery disease of native artery of native heart with stable angina pectoris (HCC)     Endocrine   Hyperlipidemia associated with type 2 diabetes mellitus (HCC)   Diabetes mellitus due to underlying condition with diabetic neuropathy, without long-term current use of insulin  (HCC)     Other   GAD (generalized anxiety disorder)   Current moderate episode of major depressive disorder without prior episode (HCC)    Assessment and Plan Assessment & Plan      No follow-ups on file.         Rockie Agent, MD  [facility] [phone] (phone) [phone] (fax)  [facility]

## 2024-07-25 ENCOUNTER — Ambulatory Visit: Admitting: Family Medicine

## 2024-07-25 ENCOUNTER — Encounter: Payer: Self-pay | Admitting: Family Medicine

## 2024-07-25 VITALS — BP 142/84 | HR 72 | Temp 98.1°F | Ht 68.0 in | Wt 199.6 lb

## 2024-07-25 DIAGNOSIS — Z7984 Long term (current) use of oral hypoglycemic drugs: Secondary | ICD-10-CM

## 2024-07-25 DIAGNOSIS — E785 Hyperlipidemia, unspecified: Secondary | ICD-10-CM

## 2024-07-25 DIAGNOSIS — I872 Venous insufficiency (chronic) (peripheral): Secondary | ICD-10-CM

## 2024-07-25 DIAGNOSIS — F17200 Nicotine dependence, unspecified, uncomplicated: Secondary | ICD-10-CM

## 2024-07-25 DIAGNOSIS — I5020 Unspecified systolic (congestive) heart failure: Secondary | ICD-10-CM

## 2024-07-25 DIAGNOSIS — E1159 Type 2 diabetes mellitus with other circulatory complications: Secondary | ICD-10-CM

## 2024-07-25 DIAGNOSIS — E1169 Type 2 diabetes mellitus with other specified complication: Secondary | ICD-10-CM

## 2024-07-25 DIAGNOSIS — I25118 Atherosclerotic heart disease of native coronary artery with other forms of angina pectoris: Secondary | ICD-10-CM

## 2024-07-25 DIAGNOSIS — I152 Hypertension secondary to endocrine disorders: Secondary | ICD-10-CM

## 2024-07-25 DIAGNOSIS — M792 Neuralgia and neuritis, unspecified: Secondary | ICD-10-CM

## 2024-07-25 DIAGNOSIS — E0840 Diabetes mellitus due to underlying condition with diabetic neuropathy, unspecified: Secondary | ICD-10-CM

## 2024-07-25 DIAGNOSIS — I739 Peripheral vascular disease, unspecified: Secondary | ICD-10-CM

## 2024-07-25 DIAGNOSIS — F411 Generalized anxiety disorder: Secondary | ICD-10-CM | POA: Diagnosis not present

## 2024-07-25 MED ORDER — SERTRALINE HCL 25 MG PO TABS
25.0000 mg | ORAL_TABLET | Freq: Every day | ORAL | 3 refills | Status: AC
Start: 2024-07-25 — End: ?

## 2024-07-25 MED ORDER — METFORMIN HCL 1000 MG PO TABS: 1000.0000 mg | ORAL_TABLET | Freq: Two times a day (BID) | ORAL | 3 refills | Status: AC

## 2024-07-25 MED ORDER — CARVEDILOL 12.5 MG PO TABS: 12.5000 mg | ORAL_TABLET | Freq: Two times a day (BID) | ORAL | 3 refills | Status: AC

## 2024-07-25 MED ORDER — ROSUVASTATIN CALCIUM 20 MG PO TABS: 20.0000 mg | ORAL_TABLET | Freq: Every day | ORAL | 3 refills | Status: AC

## 2024-07-25 NOTE — Assessment & Plan Note (Addendum)
 Chronic  Continue crestor  20mg  daily  Lipid panel ordered today

## 2024-07-25 NOTE — Assessment & Plan Note (Signed)
 Chronic  Symptoms well controlled on Zoloft  however, pt wife concerned for symptoms of apathy Recommend resuming at 25mg  dose instead of 50mg  daily

## 2024-07-25 NOTE — Assessment & Plan Note (Signed)
 Chronic  Not currently taking ASA 81mg  daily  Recommend resuming along with crestor  20mg  daily  Pt denies claudication symptoms today  Recommended smoking cessation

## 2024-07-25 NOTE — Assessment & Plan Note (Signed)
 Chronic  Recommended smoking cessation  Counseled patient for 3 mins on benefit of smoking cessation

## 2024-07-25 NOTE — Assessment & Plan Note (Signed)
 Heart failure with reduced ejection fraction Currently taking Entresto  97/103 mg twice daily and Mounjaro  5 mg daily. Not taking spironolactone  or Lasix  regularly. Discussed importance of managing heart failure and blood pressure to protect heart health. Carvedilol  recommended for heart failure management. - Continue Entresto  97/103 mg twice daily. - Encourage follow-up with cardiology for heart failure management. - Consider reinitiating spironolactone  if needed for blood pressure control. - Reinitiate carvedilol  12.5 mg for heart failure management.

## 2024-07-25 NOTE — Assessment & Plan Note (Signed)
 Type 2 diabetes mellitus with diabetic neuropathy Chronic condition  Reports worsening neuropathic pain described as 'lightning strikes' through his hand at night. Currently managed with Mounjaro  and metformin  1000 mg twice daily. Concern for potential B12 deficiency due to metformin  use, which could contribute to neuropathic symptoms. - Refill metformin  1000 mg twice daily. - Order updated hemoglobin A1c. - Order B12 level to assess for deficiency. - Refer to ophthalmology for diabetic eye exam.

## 2024-07-25 NOTE — Assessment & Plan Note (Signed)
 Chronic HTN associated with HFrEF, DM  Continue entresto  97-103mg  BID  Needs cardiology follow up  BP elevated above goal of less than 130/80 today  -not taking spironolactone  currently  Recommend continuing coreg  12.5mg  BID for HF benefit

## 2024-07-25 NOTE — Assessment & Plan Note (Signed)
 Chronic  No LE edema on exam today  No longer taking lasix  for diuresis to help with decreasing swelling in setting of HFrEF

## 2024-07-25 NOTE — Patient Instructions (Addendum)
 To keep you healthy, please keep in mind the following health maintenance items that you are due for:   Health Maintenance Due  Topic Date Due   OPHTHALMOLOGY EXAM  04/02/2019   Diabetic kidney evaluation - Urine ACR  07/08/2024     Best Wishes,   Dr. Lang    VISIT SUMMARY: During your visit, we discussed the management of your type 2 diabetes, coronary artery disease, heart failure, and other health concerns. We also addressed your severe hand pain and potential carpal tunnel syndrome. We emphasized the importance of medication adherence and follow-up appointments to manage your chronic conditions effectively.  YOUR PLAN: TYPE 2 DIABETES MELLITUS WITH DIABETIC NEUROPATHY: You have type 2 diabetes with symptoms of diabetic neuropathy, including worsening pain in your hand at night. -Refill metformin  1000 mg twice daily. -Order updated hemoglobin A1c. -Order B12 level to assess for deficiency. -Refer to ophthalmology for diabetic eye exam.  CORONARY ARTERY DISEASE WITH STABLE ANGINA: You have coronary artery disease and are currently taking Entresto  for blood pressure control. -Ensure you are taking Crestor  (rosuvastatin ) for cholesterol management. -Continue Entresto  97/103 mg twice daily. -Follow up with cardiology for heart health management.  HEART FAILURE WITH REDUCED EJECTION FRACTION: You have heart failure and are taking Entresto  and Mounjaro . We discussed the importance of managing your heart failure and blood pressure. -Continue Entresto  97/103 mg twice daily. -Encourage follow-up with cardiology for heart failure management. -Consider reinitiating spironolactone  if needed for blood pressure control. -Reinitiate carvedilol  12.5 mg for heart failure management.  PERIPHERAL ARTERIAL DISEASE: You have peripheral arterial disease and need to manage your cholesterol levels to prevent plaque buildup. -Ensure you are taking Crestor  (rosuvastatin ) for cholesterol  management.  GENERALIZED ANXIETY DISORDER: You have generalized anxiety and previously took sertraline , which helped but had some side effects. -Consider restarting sertraline  25 mg daily for anxiety management.  HYPERLIPIDEMIA: You have high cholesterol and need to manage it to reduce the risk of heart attack and stroke. -Ensure you are taking Crestor  (rosuvastatin ) for cholesterol management. -Order cholesterol panel.  HAND NEUROPATHIC PAIN, POSSIBLE CARPAL TUNNEL SYNDROME: You have severe hand pain at night, possibly due to carpal tunnel syndrome. -Refer to orthopedics for evaluation and possible nerve conduction studies to assess for carpal tunnel syndrome.  FOLLOW-UP: We discussed the importance of follow-up appointments to manage your chronic conditions. -Schedule follow-up appointment in early November. -Encourage contacting cardiologist for an appointment before the end of the year. -Advise organizing medications in a pill box for better adherence. -Coordinate with value-based care pharmacist to assist with obtaining Farxiga .                      Contains text generated by [employer].                                 Contains text generated by [employer].

## 2024-07-25 NOTE — Progress Notes (Signed)
 Established patient visit   Patient: Keith [name]   DOB: [birth year]   53 y.o. Male  MRN: [id number] Visit Date: 07/25/2024  Today's healthcare provider: Rockie Agent, MD   Chief Complaint  Patient presents with   Medical Management of Chronic Issues    Patient presents for follow up of chronic issues- states he is doing well on mounjaro .  Patient is in need of medication counseling/ education- states he currently takes entresto , mounjaro  and occasionally metformin  (needs refill) patient is unsure of which medications to take for what.   Hand Pain    Right hand, patient describes this as lightning strikes going through his hand at night when he tries to sleep, states this has been going on for years but recently started getting worse. States he will have to place his hand in ice water to be able to sleep or place it in front of the fan. States this is painful    Subjective     HPI     Medical Management of Chronic Issues    Additional comments: Patient presents for follow up of chronic issues- states he is doing well on mounjaro .  Patient is in need of medication counseling/ education- states he currently takes entresto , mounjaro  and occasionally metformin  (needs refill) patient is unsure of which medications to take for what.        Hand Pain    Additional comments: Right hand, patient describes this as lightning strikes going through his hand at night when he tries to sleep, states this has been going on for years but recently started getting worse. States he will have to place his hand in ice water to be able to sleep or place it in front of the fan. States this is painful       Last edited by Cherry Chiquita HERO, CMA on 07/25/2024  9:33 AM.       Discussed the use of AI scribe software for clinical note transcription with the patient, who gave verbal consent to proceed.  History of Present Illness Keith [name] is a 53 year old male with  coronary artery disease, type 2 diabetes, and heart failure who presents for medication management and counseling.  He experiences severe hand pain described as 'lightning strikes' at night, primarily in the tips of his fingers and thumb, causing numbness and electric shock sensations. Relief is achieved by placing his hand in ice water or in front of a fan, and he uses a brace at night for some relief.  He has coronary artery disease with stable angina and is taking Entresto  97-130 mg twice a day. He is unsure if he is currently taking Crestor  for cholesterol management, though he recalls having taken it in the past.  He has type 2 diabetes with diabetic neuropathy, managed with Mounjaro  5 mg and metformin  1000 mg daily. Neuropathy symptoms are persistent, especially in his feet, which he manages with ice water. He takes metformin  as two pills in the morning and two in the evening.  He has heart failure with reduced ejection fraction and is taking Entresto . He has Lasix  at home and takes it as needed, but is not currently taking spironolactone .  He has hyperlipidemia and is unsure if he is currently taking Crestor , although he recalls having taken it in the past.  He has generalized anxiety and has previously taken sertraline  (Zoloft ), which was helpful but had social side effects. He is not currently taking it.  He  has peripheral arterial disease and chronic venous insufficiency.  He has a history of gout but is not currently taking allopurinol  and reports no recent flares.  He has a history of tobacco use and mentions that work consumes much of his life, impacting his medication adherence. He acknowledges forgetting or being too lazy to refill medications, which his wife often reminds him about.  No current symptoms of COPD. He reports occasional episodes of feeling 'not here' with dry lips, which improve with eating. Possibly related to hypoglycemia episdoes in setting of taking glucose  lowering medications of DM management      Past Medical History:  Diagnosis Date   Chronic combined systolic (congestive) and diastolic (congestive) heart failure (HCC)    a. 06/2017 Echo: EF 35-40%, Gr1 DD; b. 02/2018 Echo: Ef 30-35%; c. 03/2018 Echo: EF 35-40%, mid-apicalanteroseptal, ant, and apical HK. Mildly dil LA. No LV thrombus.   Diabetes mellitus    Erectile dysfunction    Essential hypertension    Frequent headaches    NICM (nonischemic cardiomyopathy) (HCC)    a. 06/2017 Echo: EF 35-40%, Gr1 DD; b. 06/2017 Cath: nonobs dzs; c. 02/2018 Echo: Ef 30-35%; d. 03/2018 Echo: EF 35-40%, mid-apicalanteroseptal, ant, and apical HK. Mildly dil LA. No LV thrombus.   Non-obstructive Coronary Artery Disease    a. 06/2017 Cath: LM nl, LAD nl, D1/2/3 nl, LCX 30p, OM1 nl, OM2 min irregs, OM3/4 nl, RCA 30p/m, RPL2/3 nl.   TIA (transient ischemic attack)    a. 02/2018 MRA w/o acute findings; b. 02/2018 Carotis U/S: no hemodynamically significant stenoses.   Tobacco abuse     Medications: Outpatient Medications Prior to Visit  Medication Sig   furosemide  (LASIX ) 40 MG tablet Take 1 tablet (40 mg total) by mouth daily. (Patient taking differently: Take 40 mg by mouth daily as needed for edema.)   MOUNJARO  5 MG/0.5ML Pen INJECT 5MG  SUBCUTANEOUSLY  ONCE A WEEK   sacubitril -valsartan  (ENTRESTO ) 97-103 MG Take 1 tablet by mouth 2 (two) times daily.   sildenafil  (VIAGRA ) 100 MG tablet Take 1 tablet (100 mg total) by mouth daily as needed for erectile dysfunction.   pantoprazole  (PROTONIX ) 20 MG tablet Take 1 tablet (20 mg total) by mouth daily. (Patient not taking: Reported on 07/25/2024)   spironolactone  (ALDACTONE ) 25 MG tablet Take 1 tablet (25 mg total) by mouth daily. (Patient not taking: Reported on 07/25/2024)   [DISCONTINUED] allopurinol  (ZYLOPRIM ) 100 MG tablet Take 1 tablet (100 mg total) by mouth daily. (Patient not taking: Reported on 07/25/2024)   [DISCONTINUED] aspirin  EC 81 MG tablet Take 1 tablet  (81 mg total) by mouth daily. Swallow whole. (Patient not taking: Reported on 07/25/2024)   [DISCONTINUED] Budeson-Glycopyrrol-Formoterol (BREZTRI  AEROSPHERE) 160-9-4.8 MCG/ACT AERO Inhale 2 puffs into the lungs 2 (two) times daily. (Patient not taking: Reported on 07/25/2024)   [DISCONTINUED] carvedilol  (COREG ) 12.5 MG tablet Take 12.5 mg by mouth 2 (two) times daily. (Patient not taking: Reported on 07/25/2024)   [DISCONTINUED] Continuous Glucose Receiver (FREESTYLE LIBRE 3 READER) DEVI 1 each by Does not apply route as directed.   [DISCONTINUED] Continuous Glucose Sensor (FREESTYLE LIBRE 3 SENSOR) MISC 1 each by Does not apply route every 14 (fourteen) days. Place 1 sensor on the skin every 14 days. Use to check glucose continuously   [DISCONTINUED] dapagliflozin  propanediol (FARXIGA ) 10 MG TABS tablet Take 1 tablet (10 mg total) by mouth daily before breakfast. (Patient not taking: Reported on 02/14/2024)   [DISCONTINUED] Insulin  Degludec FlexTouch 100 UNIT/ML SOPN Inject  10 Units into the skin at bedtime. Titrate morning fasting blood sugar to goal of 100-130. Increase by 2 units every 3 days to meet this goal. (Patient not taking: Reported on 02/14/2024)   [DISCONTINUED] Insulin  Pen Needle (PEN NEEDLES) 32G X 5 MM MISC 1 each by Does not apply route at bedtime.   [DISCONTINUED] metFORMIN  (GLUCOPHAGE -XR) 500 MG 24 hr tablet Take 2 tablets (1,000 mg total) by mouth 2 (two) times daily with a meal. (Patient not taking: Reported on 07/25/2024)   [DISCONTINUED] metoCLOPramide  (REGLAN ) 10 MG tablet Take 1 tablet (10 mg total) by mouth every 8 (eight) hours as needed for up to 14 days for nausea.   [DISCONTINUED] rosuvastatin  (CRESTOR ) 20 MG tablet Take 1 tablet (20 mg total) by mouth daily. (Patient not taking: Reported on 07/25/2024)   [DISCONTINUED] sertraline  (ZOLOFT ) 50 MG tablet Take 1 tablet (50 mg total) by mouth daily. (Patient not taking: Reported on 07/25/2024)   [DISCONTINUED] tamsulosin  (FLOMAX ) 0.4 MG  CAPS capsule Take 1 capsule (0.4 mg total) by mouth daily.   No facility-administered medications prior to visit.    Review of Systems  Last CBC Lab Results  Component Value Date   WBC 5.7 04/09/2024   HGB 16.4 04/09/2024   HCT 46.7 04/09/2024   MCV 85.7 04/09/2024   MCH 30.1 04/09/2024   RDW 12.7 04/09/2024   PLT 148 (L) 04/09/2024   Last metabolic panel Lab Results  Component Value Date   GLUCOSE 144 (H) 04/09/2024   NA 136 04/09/2024   K 3.6 04/09/2024   CL 101 04/09/2024   CO2 25 04/09/2024   BUN 14 04/09/2024   CREATININE 0.68 04/09/2024   GFRNONAA >60 04/09/2024   CALCIUM  8.7 (L) 04/09/2024   PHOS 4.0 10/25/2020   PROT 6.7 02/14/2024   ALBUMIN 4.3 02/14/2024   LABGLOB 2.4 02/14/2024   AGRATIO 1.9 12/14/2022   BILITOT 0.7 02/14/2024   ALKPHOS 77 02/14/2024   AST 12 02/14/2024   ALT 6 02/14/2024   ANIONGAP 10 04/09/2024   Last lipids Lab Results  Component Value Date   CHOL 152 02/14/2024   HDL 29 (L) 02/14/2024   LDLCALC 73 02/14/2024   TRIG 309 (H) 02/14/2024   CHOLHDL 5.2 (H) 02/14/2024   Last hemoglobin A1c Lab Results  Component Value Date   HGBA1C 6.9 (H) 02/14/2024   Last thyroid  functions Lab Results  Component Value Date   TSH 2.710 07/09/2023   No results found for: VITAMINB12       Objective    BP (!) 142/84 (BP Location: Left Arm, Patient Position: Sitting, Cuff Size: Normal) Comment: manual  Pulse 72   Temp 98.1 F (36.7 C) (Oral)   Ht 5' 8 (1.727 m)   Wt 199 lb 9.6 oz (90.5 kg)   SpO2 99%   BMI 30.35 kg/m  BP Readings from Last 3 Encounters:  07/25/24 (!) 142/84  04/09/24 125/86  02/14/24 118/83   Wt Readings from Last 3 Encounters:  07/25/24 199 lb 9.6 oz (90.5 kg)  04/09/24 180 lb (81.6 kg)  02/14/24 192 lb (87.1 kg)        Physical Exam  Physical Exam VITALS: BP- 145/97 CHEST: Clear to auscultation bilaterally, no wheezes or crackles CARDIOVASCULAR: Normal heart rate and rhythm, S1 and S2 normal  without murmurs ABDOMEN: Soft, non-tender, non-distended, normal bowel sounds EXTREMITIES: No pitting edema in lower extremities    No results found for any visits on 07/25/24.  Assessment & Plan  Problem List Items Addressed This Visit     Coronary artery disease of native artery of native heart with stable angina pectoris (HCC)   Chronic  Coronary artery disease with stable angina Previously experienced chest pains. Currently taking Entresto  97/103 mg twice daily for blood pressure control. Emphasized importance of managing cholesterol levels to prevent plaque buildup and reduce risk of myocardial infarction and stroke. - Ensure taking Crestor  20mg  daily (rosuvastatin ) for cholesterol management. - Continue Entresto  97/103 mg twice daily. - Follow up with cardiology for heart health management.      Relevant Medications   rosuvastatin  (CRESTOR ) 20 MG tablet   sertraline  (ZOLOFT ) 25 MG tablet   carvedilol  (COREG ) 12.5 MG tablet   Other Relevant Orders   Lipid panel   Diabetes mellitus due to underlying condition with diabetic neuropathy, without long-term current use of insulin  (HCC) - Primary   Type 2 diabetes mellitus with diabetic neuropathy Chronic condition  Reports worsening neuropathic pain described as 'lightning strikes' through his hand at night. Currently managed with Mounjaro  and metformin  1000 mg twice daily. Concern for potential B12 deficiency due to metformin  use, which could contribute to neuropathic symptoms. - Refill metformin  1000 mg twice daily. - Order updated hemoglobin A1c. - Order B12 level to assess for deficiency. - Refer to ophthalmology for diabetic eye exam.      Relevant Medications   metFORMIN  (GLUCOPHAGE ) 1000 MG tablet   rosuvastatin  (CRESTOR ) 20 MG tablet   Other Relevant Orders   Ambulatory referral to Ophthalmology   Urine Albumin/Creatinine with ratio (send out) [LAB689]   Hemoglobin A1c   GAD (generalized anxiety disorder)    Chronic  Symptoms well controlled on Zoloft  however, pt wife concerned for symptoms of apathy Recommend resuming at 25mg  dose instead of 50mg  daily       Relevant Medications   sertraline  (ZOLOFT ) 25 MG tablet   HFrEF (heart failure with reduced ejection fraction) (HCC)   Heart failure with reduced ejection fraction Currently taking Entresto  97/103 mg twice daily and Mounjaro  5 mg daily. Not taking spironolactone  or Lasix  regularly. Discussed importance of managing heart failure and blood pressure to protect heart health. Carvedilol  recommended for heart failure management. - Continue Entresto  97/103 mg twice daily. - Encourage follow-up with cardiology for heart failure management. - Consider reinitiating spironolactone  if needed for blood pressure control. - Reinitiate carvedilol  12.5 mg for heart failure management.      Relevant Medications   rosuvastatin  (CRESTOR ) 20 MG tablet   carvedilol  (COREG ) 12.5 MG tablet   Hyperlipidemia associated with type 2 diabetes mellitus (HCC)   Chronic  Continue crestor  20mg  daily  Lipid panel ordered today       Relevant Medications   metFORMIN  (GLUCOPHAGE ) 1000 MG tablet   rosuvastatin  (CRESTOR ) 20 MG tablet   carvedilol  (COREG ) 12.5 MG tablet   Other Relevant Orders   Lipid panel   Hypertension associated with diabetes (HCC)   Chronic HTN associated with HFrEF, DM  Continue entresto  97-103mg  BID  Needs cardiology follow up  BP elevated above goal of less than 130/80 today  -not taking spironolactone  currently  Recommend continuing coreg  12.5mg  BID for HF benefit       Relevant Medications   metFORMIN  (GLUCOPHAGE ) 1000 MG tablet   rosuvastatin  (CRESTOR ) 20 MG tablet   carvedilol  (COREG ) 12.5 MG tablet   Other Relevant Orders   Urine Albumin/Creatinine with ratio (send out) [LAB689]   CMP14+EGFR   Neuropathic pain of hand   Chronic  Worsening  Suspect due to carpal tunnel  Recommended orthopedic evaluation for nerve conduction  studies  Referral to orthopedics submitted today       Relevant Orders   CMP14+EGFR   Vitamin B12   TSH + free T4   CBC   AMB referral to orthopedics   PAD (peripheral artery disease) (HCC)   Chronic  Not currently taking ASA 81mg  daily  Recommend resuming along with crestor  20mg  daily  Pt denies claudication symptoms today  Recommended smoking cessation       Relevant Medications   rosuvastatin  (CRESTOR ) 20 MG tablet   carvedilol  (COREG ) 12.5 MG tablet   Tobacco dependence   Chronic  Recommended smoking cessation  Counseled patient for 3 mins on benefit of smoking cessation       Venous insufficiency of both lower extremities   Chronic  No LE edema on exam today  No longer taking lasix  for diuresis to help with decreasing swelling in setting of HFrEF        Relevant Medications   rosuvastatin  (CRESTOR ) 20 MG tablet   carvedilol  (COREG ) 12.5 MG tablet    Assessment and Plan Assessment & Plan       Gout Chronic gout but not currently experiencing any flares. Not taking allopurinol  at this time.    Follow-Up Discussed importance of follow-up appointments to manage multiple chronic conditions effectively. Has not seen cardiologist recently. - Schedule follow-up appointment in early November. - Encourage contacting cardiologist for an appointment before the end of the year. - Advise organizing medications in a pill box for better adherence. - Coordinate with value-based care pharmacist to assist with obtaining Farxiga .     Return in about 1 month (around 08/24/2024) for Chronic F/U, HF, HTN .      Total time spent on today's visit was 56 minutes, including both face-to-face time interviewing and examining the patient, reviewing medical record including labs/imaging/specialist notes, developing and discussing further evaluation,answering patient's questions, ordering referrals to specialists, coordinating follow up care in addition to documenting in the  patient's chart.      Rockie Agent, MD  [facility] [phone] (phone) [phone] (fax)  [facility]

## 2024-07-25 NOTE — Assessment & Plan Note (Signed)
 Chronic  Worsening  Suspect due to carpal tunnel  Recommended orthopedic evaluation for nerve conduction studies  Referral to orthopedics submitted today

## 2024-07-25 NOTE — Assessment & Plan Note (Signed)
 Chronic  Coronary artery disease with stable angina Previously experienced chest pains. Currently taking Entresto  97/103 mg twice daily for blood pressure control. Emphasized importance of managing cholesterol levels to prevent plaque buildup and reduce risk of myocardial infarction and stroke. - Ensure taking Crestor  20mg  daily (rosuvastatin ) for cholesterol management. - Continue Entresto  97/103 mg twice daily. - Follow up with cardiology for heart health management.

## 2024-07-26 ENCOUNTER — Ambulatory Visit: Payer: Self-pay | Admitting: Family Medicine

## 2024-07-26 LAB — MICROALBUMIN / CREATININE URINE RATIO
Creatinine, Urine: 105.1 mg/dL
Microalb/Creat Ratio: 200 mg/g{creat} — ABNORMAL HIGH (ref 0–29)
Microalbumin, Urine: 210.1 ug/mL

## 2024-07-26 LAB — VITAMIN B12: Vitamin B-12: 433 pg/mL (ref 232–1245)

## 2024-07-26 LAB — LIPID PANEL
Chol/HDL Ratio: 5.3 ratio — ABNORMAL HIGH (ref 0.0–5.0)
Cholesterol, Total: 163 mg/dL (ref 100–199)
HDL: 31 mg/dL — ABNORMAL LOW (ref >39)
LDL Chol Calc (NIH): 80 mg/dL (ref 0–99)
Triglycerides: 321 mg/dL — ABNORMAL HIGH (ref 0–149)
VLDL Cholesterol Cal: 52 mg/dL — ABNORMAL HIGH (ref 5–40)

## 2024-07-26 LAB — HEMOGLOBIN A1C
Est. average glucose Bld gHb Est-mCnc: 154 mg/dL
Hgb A1c MFr Bld: 7 % — ABNORMAL HIGH (ref 4.8–5.6)

## 2024-07-26 LAB — CBC
Hematocrit: 53.7 % — ABNORMAL HIGH (ref 37.5–51.0)
Hemoglobin: 18.4 g/dL — ABNORMAL HIGH (ref 13.0–17.7)
MCH: 30.4 pg (ref 26.6–33.0)
MCHC: 34.3 g/dL (ref 31.5–35.7)
MCV: 89 fL (ref 79–97)
Platelets: 185 x10E3/uL (ref 150–450)
RBC: 6.06 x10E6/uL — ABNORMAL HIGH (ref 4.14–5.80)
RDW: 12.9 % (ref 11.6–15.4)
WBC: 7.6 x10E3/uL (ref 3.4–10.8)

## 2024-07-26 LAB — TSH+FREE T4
Free T4: 1.22 ng/dL (ref 0.82–1.77)
TSH: 2.76 u[IU]/mL (ref 0.450–4.500)

## 2024-07-26 LAB — CMP14+EGFR
ALT: 11 IU/L (ref 0–44)
AST: 15 IU/L (ref 0–40)
Albumin: 4.6 g/dL (ref 3.8–4.9)
Alkaline Phosphatase: 100 IU/L (ref 44–121)
BUN/Creatinine Ratio: 7 — ABNORMAL LOW (ref 9–20)
BUN: 6 mg/dL (ref 6–24)
Bilirubin Total: 1.2 mg/dL (ref 0.0–1.2)
CO2: 22 mmol/L (ref 20–29)
Calcium: 9.5 mg/dL (ref 8.7–10.2)
Chloride: 97 mmol/L (ref 96–106)
Creatinine, Ser: 0.82 mg/dL (ref 0.76–1.27)
Globulin, Total: 2.8 g/dL (ref 1.5–4.5)
Glucose: 167 mg/dL — ABNORMAL HIGH (ref 70–99)
Potassium: 4.3 mmol/L (ref 3.5–5.2)
Sodium: 136 mmol/L (ref 134–144)
Total Protein: 7.4 g/dL (ref 6.0–8.5)
eGFR: 105 mL/min/1.73 (ref >59)

## 2024-07-27 ENCOUNTER — Encounter: Payer: Self-pay | Admitting: Family Medicine

## 2024-07-31 ENCOUNTER — Encounter: Payer: Self-pay | Admitting: Family Medicine

## 2024-07-31 ENCOUNTER — Telehealth: Payer: Self-pay | Admitting: Family Medicine

## 2024-07-31 MED ORDER — SACUBITRIL-VALSARTAN 97-103 MG PO TABS: 1.0000 | ORAL_TABLET | Freq: Two times a day (BID) | ORAL | 3 refills | Status: AC

## 2024-07-31 NOTE — Telephone Encounter (Signed)
 [facility] faxed refill request for the following medications:  sacubitril -valsartan  (ENTRESTO ) 97-103 MG    Please advise.

## 2024-07-31 NOTE — Telephone Encounter (Signed)
 LOV 07/25/24 Next OV 10/09/24 Last refill 01/04/24, #60, 0 refills  Per chart patient has not filled this rx in >7 months.   Please review, thanks!

## 2024-08-07 ENCOUNTER — Encounter: Payer: Self-pay | Admitting: Family Medicine

## 2024-08-16 ENCOUNTER — Telehealth: Payer: Self-pay | Admitting: Family Medicine

## 2024-08-16 NOTE — Telephone Encounter (Signed)
[facility] faxed refill request for the following medications:   furosemide (LASIX) 40 MG tablet    Please advise.

## 2024-08-17 MED ORDER — FUROSEMIDE 40 MG PO TABS: 40.0000 mg | ORAL_TABLET | Freq: Every day | ORAL | 3 refills | Status: DC

## 2024-08-29 ENCOUNTER — Other Ambulatory Visit: Payer: Self-pay | Admitting: Family Medicine

## 2024-10-09 ENCOUNTER — Ambulatory Visit: Admitting: Family Medicine

## 2024-10-09 ENCOUNTER — Encounter: Payer: Self-pay | Admitting: Family Medicine

## 2024-10-09 VITALS — BP 130/82 | HR 78 | Ht 68.0 in | Wt 190.9 lb

## 2024-10-09 DIAGNOSIS — I739 Peripheral vascular disease, unspecified: Secondary | ICD-10-CM

## 2024-10-09 DIAGNOSIS — I25118 Atherosclerotic heart disease of native coronary artery with other forms of angina pectoris: Secondary | ICD-10-CM

## 2024-10-09 DIAGNOSIS — K219 Gastro-esophageal reflux disease without esophagitis: Secondary | ICD-10-CM

## 2024-10-09 DIAGNOSIS — E1169 Type 2 diabetes mellitus with other specified complication: Secondary | ICD-10-CM

## 2024-10-09 DIAGNOSIS — E1129 Type 2 diabetes mellitus with other diabetic kidney complication: Secondary | ICD-10-CM

## 2024-10-09 DIAGNOSIS — E1159 Type 2 diabetes mellitus with other circulatory complications: Secondary | ICD-10-CM

## 2024-10-09 DIAGNOSIS — F321 Major depressive disorder, single episode, moderate: Secondary | ICD-10-CM

## 2024-10-09 DIAGNOSIS — I5020 Unspecified systolic (congestive) heart failure: Secondary | ICD-10-CM

## 2024-10-09 DIAGNOSIS — F411 Generalized anxiety disorder: Secondary | ICD-10-CM

## 2024-10-09 DIAGNOSIS — F17200 Nicotine dependence, unspecified, uncomplicated: Secondary | ICD-10-CM

## 2024-10-09 MED ORDER — FUROSEMIDE 40 MG PO TABS
60.0000 mg | ORAL_TABLET | Freq: Every day | ORAL | 3 refills | Status: AC
Start: 2024-10-09 — End: ?

## 2024-10-09 MED ORDER — TIRZEPATIDE 7.5 MG/0.5ML ~~LOC~~ SOAJ: 7.5000 mg | SUBCUTANEOUS | 3 refills | Status: AC

## 2024-10-09 MED ORDER — EMPAGLIFLOZIN 10 MG PO TABS: 10.0000 mg | ORAL_TABLET | Freq: Every day | ORAL | 3 refills | Status: AC

## 2024-10-09 NOTE — Patient Instructions (Signed)
 To keep you healthy, please keep in mind the following health maintenance items that you are due for:   Health Maintenance Due  Topic Date Due   OPHTHALMOLOGY EXAM  04/02/2019     Best Wishes,   Dr. Lang

## 2024-10-09 NOTE — Assessment & Plan Note (Signed)
 Chronic HTN associated with HFrEF, DM  Continue entresto  97-103mg  BID  Needs cardiology follow up   Recommend continuing coreg  12.5mg  BID for HF benefit

## 2024-10-09 NOTE — Progress Notes (Unsigned)
 Established patient visit   Patient: Keith [name]   DOB: 11/26/1970   53 y.o. Male  MRN: [id number] Visit Date: 10/09/2024  Today's healthcare provider: Rockie Agent, MD   Chief Complaint  Patient presents with   Medical Management of Chronic Issues    Patient is present for chronic care follow up with PCP. Patient reports feeling well and is compliant with current regimen, requests refill of mounjaro  today.    Subjective     HPI     Medical Management of Chronic Issues    Additional comments: Patient is present for chronic care follow up with PCP. Patient reports feeling well and is compliant with current regimen, requests refill of mounjaro  today.       Last edited by Cherry Chiquita HERO, CMA on 10/09/2024  3:45 PM.       Discussed the use of AI scribe software for clinical note transcription with the patient, who gave verbal consent to proceed.  History of Present Illness Keith [name] is a 53 year old male with diabetes, heart failure, and hyperlipidemia who presents for chronic follow-up and medication refills.  He feels well and adheres to his current medication regimen. He requests refills for Mounjaro , which he takes 5 mg weekly for diabetes management, and recalls being on Mounjaro  rather than Ozempic previously. His diabetes management includes metformin  1000 mg twice daily. 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  He is on Lasix  40 mg daily for heart failure and notes that it initially increased urination significantly, but now he feels it is less effective. No swelling or changes in breathing are reported. He continues carvedilol  12.5 mg twice daily and Entresto  97/103 mg twice daily for heart failure and hypertension.  He takes Crestor  20 mg daily for hyperlipidemia and Zoloft  25 mg daily for depression. He also uses Viagra  100 mg as needed for erectile dysfunction and  spironolactone  25 mg daily.  He mentions a healing wound on his left shin, which he attributes to his diabetes, noting that cuts take longer to heal. He has a history of coronary artery disease, peripheral artery disease, tobacco dependence, and hypertension associated with diabetes.  He discusses his social activities, including plans for Thanksgiving and a potential future trip to [place] .     Past Medical History:  Diagnosis Date   Chronic combined systolic (congestive) and diastolic (congestive) heart failure (HCC)    a. 06/2017 Echo: EF 35-40%, Gr1 DD; b. 02/2018 Echo: Ef 30-35%; c. 03/2018 Echo: EF 35-40%, mid-apicalanteroseptal, ant, and apical HK. Mildly dil LA. No LV thrombus.   Diabetes mellitus    Erectile dysfunction    Essential hypertension    Frequent headaches    NICM (nonischemic cardiomyopathy) (HCC)    a. 06/2017 Echo: EF 35-40%, Gr1 DD; b. 06/2017 Cath: nonobs dzs; c. 02/2018 Echo: Ef 30-35%; d. 03/2018 Echo: EF 35-40%, mid-apicalanteroseptal, ant, and apical HK. Mildly dil LA. No LV thrombus.   Non-obstructive Coronary Artery Disease    a. 06/2017 Cath: LM nl, LAD nl, D1/2/3 nl, LCX 30p, OM1 nl, OM2 min irregs, OM3/4 nl, RCA 30p/m, RPL2/3 nl.   TIA (transient ischemic attack)    a. 02/2018 MRA w/o acute findings; b. 02/2018 Carotis U/S: no hemodynamically significant stenoses.   Tobacco abuse     Medications: Outpatient Medications Prior to Visit  Medication Sig   carvedilol  (COREG ) 12.5 MG tablet Take 1 tablet (12.5  mg total) by mouth 2 (two) times daily.   metFORMIN  (GLUCOPHAGE ) 1000 MG tablet Take 1 tablet (1,000 mg total) by mouth 2 (two) times daily with a meal.   pantoprazole  (PROTONIX ) 20 MG tablet Take 1 tablet (20 mg total) by mouth daily.   rosuvastatin  (CRESTOR ) 20 MG tablet Take 1 tablet (20 mg total) by mouth daily.   sacubitril -valsartan  (ENTRESTO ) 97-103 MG Take 1 tablet by mouth 2 (two) times daily.   sertraline  (ZOLOFT ) 25 MG tablet Take 1 tablet (25 mg  total) by mouth daily.   sildenafil  (VIAGRA ) 100 MG tablet TAKE 1 TABLET BY MOUTH ONCE DAILY AS NEEDED FOR ERECTILE DYSFUNCTION   spironolactone  (ALDACTONE ) 25 MG tablet Take 1 tablet (25 mg total) by mouth daily.   [DISCONTINUED] furosemide  (LASIX ) 40 MG tablet Take 1 tablet (40 mg total) by mouth daily.   [DISCONTINUED] MOUNJARO  5 MG/0.5ML Pen INJECT 5MG  SUBCUTANEOUSLY  ONCE A WEEK   No facility-administered medications prior to visit.    Review of Systems  Last metabolic panel Lab Results  Component Value Date   GLUCOSE 167 (H) 07/25/2024   NA 136 07/25/2024   K 4.3 07/25/2024   CL 97 07/25/2024   CO2 22 07/25/2024   BUN 6 07/25/2024   CREATININE 0.82 07/25/2024   EGFR 105 07/25/2024   CALCIUM  9.5 07/25/2024   PHOS 4.0 10/25/2020   PROT 7.4 07/25/2024   ALBUMIN 4.6 07/25/2024   LABGLOB 2.8 07/25/2024   AGRATIO 1.9 12/14/2022   BILITOT 1.2 07/25/2024   ALKPHOS 100 07/25/2024   AST 15 07/25/2024   ALT 11 07/25/2024   ANIONGAP 10 04/09/2024   Last lipids Lab Results  Component Value Date   CHOL 163 07/25/2024   HDL 31 (L) 07/25/2024   LDLCALC 80 07/25/2024   TRIG 321 (H) 07/25/2024   CHOLHDL 5.3 (H) 07/25/2024   Last hemoglobin A1c Lab Results  Component Value Date   HGBA1C 7.0 (H) 07/25/2024   Last thyroid  functions Lab Results  Component Value Date   TSH 2.760 07/25/2024   FREET4 1.22 07/25/2024        Objective    BP 130/82 (BP Location: Left Arm, Patient Position: Sitting, Cuff Size: Normal) Comment: manual  Pulse 78   Ht 5' 8 (1.727 m)   Wt 190 lb 14.4 oz (86.6 kg)   SpO2 98%   BMI 29.03 kg/m   BP Readings from Last 3 Encounters:  10/09/24 130/82  07/25/24 (!) 142/84  04/09/24 125/86   Wt Readings from Last 3 Encounters:  10/09/24 190 lb 14.4 oz (86.6 kg)  07/25/24 199 lb 9.6 oz (90.5 kg)  04/09/24 180 lb (81.6 kg)        Physical Exam  Physical Exam CHEST: Clear to auscultation bilaterally, no wheezes or  crackles. CARDIOVASCULAR: Cardiac gallop present. ABDOMEN: Normal bowel sounds. EXTREMITIES: No lower extremity or sacral edema. Healing wound on left shin.    No results found for any visits on 10/09/24.  Assessment & Plan     Problem List Items Addressed This Visit     Coronary artery disease of native artery of native heart with stable angina pectoris   Atherosclerotic heart disease of native coronary artery with angina Chronic  Atherosclerotic heart disease of native coronary artery with angina. Current management includes Crestor  for hyperlipidemia. - Continue Crestor  20 mg daily      Relevant Medications   furosemide  (LASIX ) 40 MG tablet   Current moderate episode of major depressive disorder without prior  episode (HCC)   Chronic depression. Managed with Zoloft . 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 Last A1c was 7.0 two months ago. Moderate albuminuria secondary to diabetes. Current regimen includes Mounjaro  and metformin . Discussion about switching to Ozempic for additional kidney and heart protection, but decision made to continue Mounjaro  due to its effectiveness in weight management and blood sugar control. Jardiance  considered for additional kidney protection. - Increase  Mounjaro  7.5 mg weekly - Continue metformin  1000 mg twice daily - Started Jardiance  10 mg for kidney protection and diabetes management      Relevant Medications   empagliflozin  (JARDIANCE ) 10 MG TABS tablet   tirzepatide  (MOUNJARO ) 7.5 MG/0.5ML Pen   GAD (generalized anxiety disorder)   Chronic  Continue Zoloft  25mg  daily       Gastroesophageal reflux disease    Chronic gastroesophageal reflux disease (GERD) -continue Protonix  20 daily       HFrEF (heart failure with reduced ejection fraction) (HCC)   Chronic condition  Heart failure with reduced ejection fraction. Currently on Entresto , carvedilol , and Lasix . Reports decreased diuretic effect of Lasix , possibly due to tolerance. No current swelling or edema. Discussion about increasing Lasix  dosage to improve diuretic effect. - Continue Entresto  97/103 mg twice daily - Continue carvedilol  12.5 mg twice daily - Increased Lasix  to 60 mg daily by taking one and a half tablets      Relevant Medications   furosemide  (LASIX ) 40 MG tablet   Hyperlipidemia associated with type 2 diabetes mellitus (HCC)   Chronic hyperlipidemia. Managed with Crestor . - Continue Crestor  20 mg daily      Relevant Medications   empagliflozin  (JARDIANCE ) 10 MG TABS tablet   tirzepatide  (MOUNJARO ) 7.5 MG/0.5ML Pen   furosemide  (LASIX ) 40 MG tablet   Hypertension associated with diabetes (HCC)   Chronic HTN associated with HFrEF, DM  Continue entresto  97-103mg  BID  Needs cardiology follow up   Recommend continuing coreg  12.5mg  BID for HF benefit       Relevant Medications   empagliflozin  (JARDIANCE ) 10 MG TABS tablet   tirzepatide  (MOUNJARO ) 7.5 MG/0.5ML Pen   furosemide  (LASIX ) 40 MG tablet   PAD (peripheral artery disease)   Chronic  Not currently taking ASA 81mg  daily  Recommend continuing crestor  20mg  daily  Pt denies claudication symptoms today  Recommended smoking cessation       Relevant Medications   furosemide  (LASIX ) 40 MG tablet   Tobacco dependence   Chronic  Recommended smoking cessation         Assessment and Plan Assessment & Plan   Healing wound, left lower extremity Healing wound on left lower extremity. Slow healing noted, but some scabbing present. - CTM      Return in about 3 months (around 01/09/2025) for HTN, DM, HF.         Rockie Agent, MD  [facility] [phone] (phone) [phone] (fax)  [facility]

## 2024-10-10 DIAGNOSIS — K219 Gastro-esophageal reflux disease without esophagitis: Secondary | ICD-10-CM | POA: Insufficient documentation

## 2024-10-10 NOTE — Assessment & Plan Note (Signed)
 Chronic depression. Managed with Zoloft . - Continue Zoloft  25 mg daily

## 2024-10-10 NOTE — Assessment & Plan Note (Signed)
 Chronic condition  Heart failure with reduced ejection fraction. Currently on Entresto , carvedilol , and Lasix . Reports decreased diuretic effect of Lasix , possibly due to tolerance. No current swelling or edema. Discussion about increasing Lasix  dosage to improve diuretic effect. - Continue Entresto  97/103 mg twice daily - Continue carvedilol  12.5 mg twice daily - Increased Lasix  to 60 mg daily by taking one and a half tablets

## 2024-10-10 NOTE — Assessment & Plan Note (Signed)
 Atherosclerotic heart disease of native coronary artery with angina Chronic  Atherosclerotic heart disease of native coronary artery with angina. Current management includes Crestor  for hyperlipidemia. - Continue Crestor  20 mg daily

## 2024-10-10 NOTE — Assessment & Plan Note (Signed)
 Chronic gastroesophageal reflux disease (GERD) -continue Protonix  20 daily

## 2024-10-10 NOTE — Assessment & Plan Note (Signed)
 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 Last A1c was 7.0 two months ago. Moderate albuminuria secondary to diabetes. Current regimen includes Mounjaro  and metformin . Discussion about switching to Ozempic for additional kidney and heart protection, but decision made to continue Mounjaro  due to its effectiveness in weight management and blood sugar control. Jardiance  considered for additional kidney protection. - Increase  Mounjaro  7.5 mg weekly - Continue metformin  1000 mg twice daily - Started Jardiance  10 mg for kidney protection and diabetes management

## 2024-10-10 NOTE — Assessment & Plan Note (Signed)
 Chronic  Not currently taking ASA 81mg  daily  Recommend continuing crestor  20mg  daily  Pt denies claudication symptoms today  Recommended smoking cessation

## 2024-10-10 NOTE — Assessment & Plan Note (Signed)
 Chronic  Recommended smoking cessation

## 2024-10-10 NOTE — Assessment & Plan Note (Signed)
 Chronic hyperlipidemia. Managed with Crestor . - Continue Crestor  20 mg daily

## 2024-10-10 NOTE — Assessment & Plan Note (Signed)
 Chronic  Continue Zoloft  25mg  daily

## 2025-01-08 ENCOUNTER — Ambulatory Visit: Admitting: Family Medicine
